# Patient Record
Sex: Male | Born: 1970 | Race: White | Hispanic: No | Smoking: Former smoker
Health system: Southern US, Community
[De-identification: ages and names within clinical notes are randomized; demographics above are authoritative.]

## PROBLEM LIST (undated history)

## (undated) ENCOUNTER — Emergency Department (HOSPITAL_COMMUNITY): Admission: EM | Payer: Managed Care, Other (non HMO)

## (undated) DIAGNOSIS — E119 Type 2 diabetes mellitus without complications: Secondary | ICD-10-CM

## (undated) DIAGNOSIS — J189 Pneumonia, unspecified organism: Secondary | ICD-10-CM

## (undated) DIAGNOSIS — G4730 Sleep apnea, unspecified: Secondary | ICD-10-CM

## (undated) DIAGNOSIS — Z5189 Encounter for other specified aftercare: Secondary | ICD-10-CM

## (undated) DIAGNOSIS — K219 Gastro-esophageal reflux disease without esophagitis: Secondary | ICD-10-CM

## (undated) DIAGNOSIS — D869 Sarcoidosis, unspecified: Secondary | ICD-10-CM

## (undated) DIAGNOSIS — E785 Hyperlipidemia, unspecified: Secondary | ICD-10-CM

## (undated) DIAGNOSIS — R7303 Prediabetes: Secondary | ICD-10-CM

## (undated) DIAGNOSIS — I10 Essential (primary) hypertension: Secondary | ICD-10-CM

## (undated) DIAGNOSIS — R0600 Dyspnea, unspecified: Secondary | ICD-10-CM

## (undated) HISTORY — DX: Encounter for other specified aftercare: Z51.89

## (undated) HISTORY — DX: Essential (primary) hypertension: I10

## (undated) HISTORY — DX: Hyperlipidemia, unspecified: E78.5

## (undated) HISTORY — DX: Gastro-esophageal reflux disease without esophagitis: K21.9

---

## 2000-06-23 HISTORY — PX: LOBECTOMY: SHX5089

## 2017-03-20 ENCOUNTER — Ambulatory Visit (INDEPENDENT_AMBULATORY_CARE_PROVIDER_SITE_OTHER): Payer: BLUE CROSS/BLUE SHIELD | Admitting: Family Medicine

## 2017-03-20 ENCOUNTER — Encounter: Payer: Self-pay | Admitting: Family Medicine

## 2017-03-20 VITALS — BP 132/82 | HR 67 | Temp 98.3°F | Ht 75.5 in | Wt 308.2 lb

## 2017-03-20 DIAGNOSIS — Z23 Encounter for immunization: Secondary | ICD-10-CM | POA: Diagnosis not present

## 2017-03-20 DIAGNOSIS — Z13220 Encounter for screening for lipoid disorders: Secondary | ICD-10-CM | POA: Diagnosis not present

## 2017-03-20 DIAGNOSIS — R0683 Snoring: Secondary | ICD-10-CM | POA: Diagnosis not present

## 2017-03-20 DIAGNOSIS — R7303 Prediabetes: Secondary | ICD-10-CM

## 2017-03-20 DIAGNOSIS — B353 Tinea pedis: Secondary | ICD-10-CM

## 2017-03-20 DIAGNOSIS — D860 Sarcoidosis of lung: Secondary | ICD-10-CM | POA: Diagnosis not present

## 2017-03-20 DIAGNOSIS — N400 Enlarged prostate without lower urinary tract symptoms: Secondary | ICD-10-CM

## 2017-03-20 DIAGNOSIS — Z7689 Persons encountering health services in other specified circumstances: Secondary | ICD-10-CM

## 2017-03-20 LAB — CBC WITH DIFFERENTIAL/PLATELET
BASOS ABS: 0.1 10*3/uL (ref 0.0–0.1)
Basophils Relative: 1.1 % (ref 0.0–3.0)
Eosinophils Absolute: 0.3 10*3/uL (ref 0.0–0.7)
Eosinophils Relative: 6.1 % — ABNORMAL HIGH (ref 0.0–5.0)
HCT: 41 % (ref 39.0–52.0)
Hemoglobin: 13.9 g/dL (ref 13.0–17.0)
Lymphocytes Relative: 24.6 % (ref 12.0–46.0)
Lymphs Abs: 1.3 10*3/uL (ref 0.7–4.0)
MCHC: 33.9 g/dL (ref 30.0–36.0)
MCV: 87 fl (ref 78.0–100.0)
MONO ABS: 0.6 10*3/uL (ref 0.1–1.0)
MONOS PCT: 10.6 % (ref 3.0–12.0)
NEUTROS ABS: 3.1 10*3/uL (ref 1.4–7.7)
Neutrophils Relative %: 57.6 % (ref 43.0–77.0)
PLATELETS: 302 10*3/uL (ref 150.0–400.0)
RBC: 4.71 Mil/uL (ref 4.22–5.81)
RDW: 13.7 % (ref 11.5–15.5)
WBC: 5.4 10*3/uL (ref 4.0–10.5)

## 2017-03-20 LAB — COMPREHENSIVE METABOLIC PANEL
ALBUMIN: 4.4 g/dL (ref 3.5–5.2)
ALT: 31 U/L (ref 0–53)
AST: 22 U/L (ref 0–37)
Alkaline Phosphatase: 67 U/L (ref 39–117)
BUN: 12 mg/dL (ref 6–23)
CO2: 28 mEq/L (ref 19–32)
CREATININE: 0.71 mg/dL (ref 0.40–1.50)
Calcium: 9.6 mg/dL (ref 8.4–10.5)
Chloride: 100 mEq/L (ref 96–112)
GFR: 125.66 mL/min (ref >60.00)
Glucose, Bld: 106 mg/dL — ABNORMAL HIGH (ref 70–99)
POTASSIUM: 4.5 meq/L (ref 3.5–5.1)
Sodium: 135 mEq/L (ref 135–145)
TOTAL PROTEIN: 7 g/dL (ref 6.0–8.3)
Total Bilirubin: 0.5 mg/dL (ref 0.2–1.2)

## 2017-03-20 LAB — HEMOGLOBIN A1C: HEMOGLOBIN A1C: 6.5 % (ref 4.6–6.5)

## 2017-03-20 LAB — LIPID PANEL
CHOL/HDL RATIO: 4
Cholesterol: 165 mg/dL (ref 0–200)
HDL: 43.3 mg/dL (ref >39.00)
LDL CALC: 86 mg/dL (ref 0–99)
NonHDL: 121.92
TRIGLYCERIDES: 182 mg/dL — AB (ref 0.0–149.0)
VLDL: 36.4 mg/dL (ref 0.0–40.0)

## 2017-03-20 MED ORDER — CLOTRIMAZOLE 1 % EX CREA: 1.0000 "application " | TOPICAL_CREAM | Freq: Two times a day (BID) | CUTANEOUS | 1 refills | Status: DC

## 2017-03-20 NOTE — Progress Notes (Signed)
Patient presents to clinic today to establish care and for acute concerns.  SUBJECTIVE: PMH: Pt is a 46 yo CF with pmh sig for pulmonary sarcoidosis, BPH, pre-DM, HLD. Patient was formerly seen in [place]. He recently relocated to the area 2/2 cold weather making it difficult for him to breathe.  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 -ower left lobectomy for a cyst seen on xray. -States had multiple issues with breathing throughout his life. -Frequent history of pneumonia. -Does not have a pulmonologist.  -Is not on medication currently. -Has a nebulizer, uses when necessary  BPH: -Patient formally on tamsulosin -d/c'd last week has patient thought it was causing weight gain. -Patient typically to 295 weight increased to 308 LBS -Patient denies urinary urgency -Endorses some incomplete emptying, wakes up 2-3x/night to urinate. -Feels ok not taking meds at this time.  PreDM -pt states last Hgb A1c in Feb 2018 was 6% -Pt endorses a FHx of DM -Has been trying to eat better. -Hard to exercise, is a truck driver.  Snoring, Acute: -Per pt told he snores and has apneic spells per wife. -Patient does endorse feeling tired throughout the day, but not sleepy. -At times it is hard for patient to sleep through the night. -HTN noted as pt is on losartan '100mg'$   Foot complaint: -Pt with b/l itching x 52month -Pt notes dried, cracked, peeling feet. -Tries to wear socks with the "vents" built in. -Denies being at the gym or walking barefoot on public surfaces.  Allergies: PCN- told not to take, not sure what happens Naproxen- Hives  PSurgHx: -[place]  Social hx: Pt recently moved to the area with his wife, Jackson Hatfield(who was seen earlier today by this provider).  [place]  Pt currently works as a tAdministrator  He denies  tobacco and drug use.  Endorses very infrequent EtOH use.  FMHx: Mom-Emphysema, tobacco use Dad- Heart dz., DM, Emphysema MGM-desc at [id number] PGM-DM PGF-never met Pt has 3 sisters.  2 of which smoke desc brother-had DM and EtOH Older brother DM   Past Medical History:  Diagnosis Date  . Hyperlipidemia   . Hypertension     History reviewed. No pertinent surgical history.  No current outpatient prescriptions on file prior to visit.   No current facility-administered medications on file prior to visit.     Allergies  Allergen Reactions  . Naproxen Hives  . Penicillins Hives    Family History  Problem Relation Age of Onset  . COPD Mother   . Alcohol abuse Father   . Arthritis Father   . Diabetes Father   . Heart disease Father   . Hyperlipidemia Father   . Kidney disease Father   . Diabetes Brother   . Stroke Brother   . Alcohol abuse Brother   . Diabetes Brother   . Drug abuse Brother     Social History   Social History  . Marital status: Married    Spouse name: N/A  . Number of children: N/A  . Years of education: N/A   Occupational History  . Not on file.   Social History Main Topics  . Smoking status: Never Smoker  . Smokeless tobacco: Never Used  . Alcohol use Yes  . Drug use: No  . Sexual activity: Not on file   Other  Topics Concern  . Not on file   Social History Narrative  . No narrative on file    ROS General: Denies fever, chills, night sweats, changes in weight, changes in appetite HEENT: Denies headaches, ear pain, changes in vision, rhinorrhea, sore throat CV: Denies CP, palpitations, SOB, orthopnea Pulm: Denies SOB, cough, wheezing  +snoring and apnea while sleeping GI: Denies abdominal pain, nausea, vomiting, diarrhea, constipation GU: Denies dysuria, hematuria  +frequency at night Msk: Denies muscle cramps, joint pains Neuro: Denies weakness, numbness, tingling Skin: Denies rashes, bruising  +itching of b/l feet Psych:  Denies depression, anxiety, hallucinations  BP 132/82 (BP Location: Right Arm, Patient Position: Sitting, Cuff Size: Large)   Pulse 67   Temp 98.3 F (36.8 C) (Oral)   Ht 6' 3.5" (1.918 m)   Wt (!) 308 lb 3.2 oz (139.8 kg)   SpO2 97%   BMI 38.01 kg/m   Physical Exam Gen. Pleasant, well developed, well-nourished, obese, in NAD HEENT - [place]/AT, PERRL, no scleral icterus, no nasal drainage, pharynx without erythema or exudate. Neck: No JVD, no thyromegaly, no carotid bruits Lungs: no accessory muscle use, CTAB, no wheezes, rales or rhonchi Cardiovascular: RRR, No r/g/m, no peripheral edema Abdomen: BS present, soft, nontender,nondistended, no hepatosplenomegaly Musculoskeletal: No deformities, moves all four extremities, no cyanosis or clubbing, normal tone Neuro:  A&Ox3, CN II-XII intact, normal gait Skin:  Warm, dry, intact.  B/l soles of feet cracked, dry, peeling.  No erythema, moist skin noted.  calloses present. Psych: normal affect, mood appropriate  No results found for this or any previous visit (from the past 2160 hour(s)).  Assessment/Plan: Pulmonary sarcoidosis (HCC) -Currently stable -Continue nebulizer use when necessary - Plan: Ambulatory referral to Pulmonology  Need for immunization against influenza  - Plan: Flu Vaccine QUAD 36+ mos IM  Screening for cholesterol level  - Plan: Lipid panel  Encounter to establish care -Records release  Benign prostatic hyperplasia without lower urinary tract symptoms -We'll not restart tamsulosin at this time -If frequency becomes an issue will start new med  Snoring  -Also with periods of apnea noted by wife -Weight loss advised -Plan: CBC with Differential/Platelet, Comprehensive metabolic panel, Nocturnal polysomnography (NPSG), CBC with Differential/Platelet  Tinea pedis of both feet -Discussed feet hygiene  -Given handout -Plan: clotrimazole (CLOTRIMAZOLE AF) 1 % cream  Pre-diabetes  -Discussed diet and  exercise -Given handout - Plan: Hemoglobin A1c

## 2017-03-20 NOTE — Patient Instructions (Addendum)
Athlete's Foot Athlete's foot (tinea pedis) is a fungal infection of the skin on the feet. It often occurs on the skin that is between or underneath the toes. It can also occur on the soles of the feet. The infection can spread from person to person (is contagious). Follow these instructions at home:  Apply or take over-the-counter and prescription medicines only as told by your doctor.  Keep all follow-up visits as told by your doctor. This is important.  Do not scratch your feet.  Keep your feet dry: ? Wear cotton or wool socks. Change your socks every day or if they become wet. ? Wear shoes that allow air to move around, such as sandals or canvas tennis shoes.  Wash and dry your feet: ? Every day or as told by your doctor. ? After exercising. ? Including the area between your toes.  Wear sandals in wet areas, such as locker rooms and shared showers.  Do not share any of these items: ? Towels. ? Nail clippers. ? Other personal items that touch your feet.  If you have diabetes, keep your blood sugar under control. Contact a doctor if:  You have a fever.  You have swelling, soreness, warmth, or redness in your foot.  You are not getting better with treatment.  Your symptoms get worse.  You have new symptoms. This information is not intended to replace advice given to you by your health care provider. Make sure you discuss any questions you have with your health care provider. Document Released: 11/26/2007 Document Revised: 11/15/2015 Document Reviewed: 12/11/2014 Elsevier Interactive Patient Education  2018 [employer]. Sarcoidosis Sarcoidosis is a disease that causes inflammation in your organs and other areas of your body. The lungs are most often affected (pulmonary sarcoidosis). Sarcoidosis can also affect your lymph nodes, liver, eyes, skin, or any other body tissue. When you have sarcoidosis, small clumps of tissue (granulomas) form in the affected area of your  body. Granulomas are made up of your body's defense (immune) cells. Inflammation results when your body reacts to a harmful substance. Normally, inflammation goes away after immune cells get rid of the harmful substance. In sarcoidosis, the immune cells form granulomas instead. What are the causes? The exact cause of sarcoidosis is not known. Something triggers the immune system to respond, such as dust, chemicals, bacteria, or a virus. What increases the risk? You may be at a greater risk for sarcoidosis if you:  Have a family history of the disease.  Are African American.  Are of Northern European ancestry.  Are 68-47 years old.  Are male.  What are the signs or symptoms? Many people with sarcoidosis have no symptoms. Others have very mild symptoms. Sarcoidosis most often affects the lungs. Symptoms include:  Chest pain.  Coughing.  Wheezing.  Shortness of breath.  Other common symptoms include:  Night sweats.  Weight loss.  Fatigue.  Depression.  A sense of uneasiness.  How is this diagnosed? Sarcoidosis may be diagnosed by:  Medical history and physical exam.  Chest X-ray. This looks for granulomas in your lungs.  Lung function tests. These measure your breathing and look for problems related to sarcoidosis.  Examining a sample of tissue under a microscope (biopsy).  How is this treated? Sarcoidosis usually clears up without treatment. You may take medicines to reduce inflammation or relieve symptoms. These may include:  Prednisone. This steroid reduces inflammation related to sarcoidosis.  Chloroquine or hydroxychloroquine. These are antimalarial medicines used to treat sarcoidosis that  affects the skin or brain.  Methotrexate, leflunomide, or azathioprine. These medicines affect the immune system and can help with sarcoidosis in the joints, eyes, skin, or lungs.  Inhalers. Inhaled medicines can help you breathe if sarcoidosis is affecting your  lungs.  Follow these instructions at home:  Do not use any tobacco products, including cigarettes, chewing tobacco, or electronic cigarettes. If you need help quitting, ask your health care provider.  Avoid secondhand smoke.  Avoid irritating dust and chemicals. Stay indoors on days when air quality is poor in your area.  Take medicines only as directed by your health care provider. Contact a health care provider if:  You have vision problems.  You have shortness of breath.  You have a dry, persistent cough.  You have an irregular heartbeat.  You have pain or ache in your joints, hands, or feet.  You have an unexplained rash. Get help right away if: You have chest pain. This information is not intended to replace advice given to you by your health care provider. Make sure you discuss any questions you have with your health care provider. Document Released: 04/09/2004 Document Revised: 11/15/2015 Document Reviewed: 10/05/2013 Elsevier Interactive Patient Education  2018 [employer]. Prediabetes Eating Plan Prediabetes-also called impaired glucose tolerance or impaired fasting glucose-is a condition that causes blood sugar (blood glucose) levels to be higher than normal. Following a healthy diet can help to keep prediabetes under control. It can also help to lower the risk of type 2 diabetes and heart disease, which are increased in people who have prediabetes. Along with regular exercise, a healthy diet:  Promotes weight loss.  Helps to control blood sugar levels.  Helps to improve the way that the body uses insulin.  What do I need to know about this eating plan?  Use the glycemic index (GI) to plan your meals. The index tells you how quickly a food will raise your blood sugar. Choose low-GI foods. These foods take a longer time to raise blood sugar.  Pay close attention to the amount of carbohydrates in the food that you eat. Carbohydrates increase blood sugar  levels.  Keep track of how many calories you take in. Eating the right amount of calories will help you to achieve a healthy weight. Losing about 7 percent of your starting weight can help to prevent type 2 diabetes.  You may want to follow a Mediterranean diet. This diet includes a lot of vegetables, lean meats or fish, whole grains, fruits, and healthy oils and fats. What foods can I eat? Grains Whole grains, such as whole-wheat or whole-grain breads, crackers, cereals, and pasta. Unsweetened oatmeal. Bulgur. Barley. Quinoa. Espaillat rice. Corn or whole-wheat flour tortillas or taco shells. Vegetables Lettuce. Spinach. Peas. Beets. Cauliflower. Cabbage. Broccoli. Carrots. Tomatoes. Squash. Eggplant. Herbs. Peppers. Onions. Cucumbers. Brussels sprouts. Fruits Berries. Bananas. Apples. Oranges. Grapes. Papaya. Mango. Pomegranate. Kiwi. Grapefruit. Cherries. Meats and Other Protein Sources Seafood. Lean meats, such as chicken and [place] or lean cuts of pork and beef. Tofu. Eggs. Nuts. Beans. Dairy Low-fat or fat-free dairy products, such as yogurt, cottage cheese, and cheese. Beverages Water. Tea. Coffee. Sugar-free or diet soda. Seltzer water. Milk. Milk alternatives, such as soy or almond milk. Condiments Mustard. Relish. Low-fat, low-sugar ketchup. Low-fat, low-sugar barbecue sauce. Low-fat or fat-free mayonnaise. Sweets and Desserts Sugar-free or low-fat pudding. Sugar-free or low-fat ice cream and other frozen treats. Fats and Oils Avocado. Walnuts. Olive oil. The items listed above may not be a complete list of  recommended foods or beverages. Contact your dietitian for more options. What foods are not recommended? Grains Refined white flour and flour products, such as bread, pasta, snack foods, and cereals. Beverages Sweetened drinks, such as sweet iced tea and soda. Sweets and Desserts Baked goods, such as cake, cupcakes, pastries, cookies, and cheesecake. The items listed above  may not be a complete list of foods and beverages to avoid. Contact your dietitian for more information. This information is not intended to replace advice given to you by your health care provider. Make sure you discuss any questions you have with your health care provider. Document Released: 10/24/2014 Document Revised: 11/15/2015 Document Reviewed: 07/05/2014 Elsevier Interactive Patient Education  2017 [employer].   We have ordered labs at this visit. It can take up to 1-2 weeks for results and processing. If results require follow up or explanation, we will call you with instructions. Clinically stable results will be released to your [facility] or sent to you via mail. If you have not heard from [place] or cannot find your results in [facility] in 2 weeks please contact our office at [phone].

## 2017-03-23 ENCOUNTER — Telehealth: Payer: Self-pay | Admitting: Family Medicine

## 2017-03-23 NOTE — Telephone Encounter (Signed)
Spoke with patient regarding labs nothing further needed

## 2017-03-23 NOTE — Telephone Encounter (Signed)
Pt is returning torry call

## 2017-03-27 ENCOUNTER — Institutional Professional Consult (permissible substitution): Payer: BLUE CROSS/BLUE SHIELD | Admitting: Pulmonary Disease

## 2017-04-01 ENCOUNTER — Telehealth: Payer: Self-pay | Admitting: Family Medicine

## 2017-04-01 NOTE — Telephone Encounter (Signed)
Please advise 

## 2017-04-01 NOTE — Telephone Encounter (Signed)
° ° °  Pt request refill of the following:   losartan (COZAAR) 100 MG tablet  lovastatin (MEVACOR) 10 MG tablet    Phamacy:  [facility] [address]

## 2017-04-02 ENCOUNTER — Other Ambulatory Visit: Payer: Self-pay | Admitting: Family Medicine

## 2017-04-02 MED ORDER — LOSARTAN POTASSIUM 100 MG PO TABS: 100.0000 mg | ORAL_TABLET | Freq: Every day | ORAL | 3 refills | Status: DC

## 2017-04-02 MED ORDER — LOVASTATIN 10 MG PO TABS: 10.0000 mg | ORAL_TABLET | Freq: Every day | ORAL | 3 refills | Status: DC

## 2017-04-02 NOTE — Progress Notes (Signed)
Refilled losartan 100 mg and lovastatin 10 mg

## 2017-04-02 NOTE — Telephone Encounter (Signed)
Both refilled

## 2017-04-08 ENCOUNTER — Institutional Professional Consult (permissible substitution): Payer: BLUE CROSS/BLUE SHIELD | Admitting: Emergency Medicine

## 2017-07-29 ENCOUNTER — Encounter: Payer: Self-pay | Admitting: Family Medicine

## 2017-07-29 ENCOUNTER — Ambulatory Visit: Payer: BLUE CROSS/BLUE SHIELD | Admitting: Family Medicine

## 2017-07-29 VITALS — BP 120/80 | HR 89 | Temp 98.8°F | Wt 310.9 lb

## 2017-07-29 DIAGNOSIS — N401 Enlarged prostate with lower urinary tract symptoms: Secondary | ICD-10-CM | POA: Diagnosis not present

## 2017-07-29 DIAGNOSIS — J4520 Mild intermittent asthma, uncomplicated: Secondary | ICD-10-CM

## 2017-07-29 DIAGNOSIS — R3914 Feeling of incomplete bladder emptying: Secondary | ICD-10-CM | POA: Diagnosis not present

## 2017-07-29 DIAGNOSIS — R0683 Snoring: Secondary | ICD-10-CM

## 2017-07-29 MED ORDER — ALBUTEROL SULFATE 0.63 MG/3ML IN NEBU: 1.0000 | INHALATION_SOLUTION | Freq: Four times a day (QID) | RESPIRATORY_TRACT | 5 refills | Status: DC | PRN

## 2017-07-29 MED ORDER — ALBUTEROL SULFATE HFA 108 (90 BASE) MCG/ACT IN AERS: 2.0000 | INHALATION_SPRAY | RESPIRATORY_TRACT | 2 refills | Status: DC | PRN

## 2017-07-29 MED ORDER — TAMSULOSIN HCL 0.4 MG PO CAPS: 0.4000 mg | ORAL_CAPSULE | Freq: Every day | ORAL | 3 refills | Status: DC

## 2017-07-29 NOTE — Progress Notes (Signed)
Subjective:    Patient ID: Jackson Hatfield, male    DOB: 1971/03/28, 47 y.o.   MRN: [id number]  Chief Complaint  Patient presents with  . Acute Visit    HPI Patient was seen today for follow-up.  Patient endorses increasing urinary symptoms.  In the past he was on Flomax but has been off of it for several months.  Patient endorses nocturia, incomplete bladder emptying, foul-smelling urine.  Patient also endorses one episode of the ED.  Patient does endorse increased stress at home and work.  Patient also endorses weight gain.  States he is trying to lose weight by doing a no carb diet with his wife.  Patient also endorses needing a referral for sleep study.  Referral was placed during last [facility]V, however patient was unable to make the appointment.  A new referral was needed.  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  He has 3 months to complete this.  Past Medical History:  Diagnosis Date  . Hyperlipidemia   . Hypertension     Allergies  Allergen Reactions  . Naproxen Hives  . Penicillins Hives    ROS General: Denies fever, chills, night sweats, changes in weight, changes in appetite HEENT: Denies headaches, ear pain, changes in vision, rhinorrhea, sore throat CV: Denies CP, palpitations, SOB, orthopnea Pulm: Denies SOB, cough, wheezing  + snoring GI: Denies abdominal pain, nausea, vomiting, diarrhea, constipation GU: Denies dysuria, hematuria, vaginal discharge  +urinary frequnecy, incomplete bladder emptying, odor, ED Msk: Denies muscle cramps, joint pains Neuro: Denies weakness, numbness, tingling Skin: Denies rashes, bruising Psych: Denies depression, anxiety, hallucinations     Objective:    Blood pressure 120/80, pulse 89, temperature 98.8 F (37.1 C), temperature source Oral, weight (!) 310 lb 14.4 oz (141 kg).   Gen. Pleasant, well-nourished, in no distress, normal affect   HEENT: [place]/AT, face symmetric, no scleral  icterus, PERRLA, nares patent without drainage, pharynx without erythema or exudate. Lungs: no accessory muscle use, CTAB, no wheezes or rales Cardiovascular: RRR, no m/r/g, no peripheral edema Abdomen: BS present, soft, NT/ND Neuro:  A&Ox3, CN II-XII intact, normal gait Skin:  Warm, no lesions/ rash   Wt Readings from Last 3 Encounters:  07/29/17 (!) 310 lb 14.4 oz (141 kg)  03/20/17 (!) 308 lb 3.2 oz (139.8 kg)    Lab Results  Component Value Date   WBC 5.4 03/20/2017   HGB 13.9 03/20/2017   HCT 41.0 03/20/2017   PLT 302.0 03/20/2017   GLUCOSE 106 (H) 03/20/2017   CHOL 165 03/20/2017   TRIG 182.0 (H) 03/20/2017   HDL 43.30 03/20/2017   LDLCALC 86 03/20/2017   ALT 31 03/20/2017   AST 22 03/20/2017   NA 135 03/20/2017   K 4.5 03/20/2017   CL 100 03/20/2017   CREATININE 0.71 03/20/2017   BUN 12 03/20/2017   CO2 28 03/20/2017   HGBA1C 6.5 03/20/2017    Assessment/Plan:  Benign prostatic hyperplasia with incomplete bladder emptying  -Attempted to obtain urine sample, however pt was unable to produce one.  Will obtain at later date. -will restart flomax. - Plan: tamsulosin (FLOMAX) 0.4 MG CAPS capsule -f/u in the next month if symptoms continue.    Mild intermittent asthma without complication  - Plan: albuterol (PROVENTIL HFA;VENTOLIN HFA) 108 (90 Base) MCG/ACT inhaler, albuterol (ACCUNEB) 0.63 MG/3ML nebulizer solution  Snoring  -STOP BANG score 6 = high risk for OSA   1 point for snoring,  observed apnea, blood pressure, BMI, neck size, male gender. - Plan: Ambulatory referral to Pulmonology   F/u in 1 month, sooner if needed.  Will also readdress ED if not improved/resolved.   Grier Mitts, MD

## 2017-07-29 NOTE — Patient Instructions (Addendum)
Benign Prostatic Hyperplasia  Benign prostatic hyperplasia (BPH) is an enlarged prostate gland that is caused by the normal aging process and not by cancer. The prostate is a walnut-sized gland that is involved in the production of semen. It is located in front of the rectum and below the bladder. The bladder stores urine and the urethra is the tube that carries the urine out of the body. The prostate may get bigger as a man gets older.  An enlarged prostate can press on the urethra. This can make it harder to pass urine. The build-up of urine in the bladder can cause infection. Back pressure and infection may progress to bladder damage and kidney (renal) failure.  What are the causes?  This condition is part of a normal aging process. However, not all men develop problems from this condition. If the prostate enlarges away from the urethra, urine flow will not be blocked. If it enlarges toward the urethra and compresses it, there will be problems passing urine.  What increases the risk?  This condition is more likely to develop in men over the age of 50 years.  What are the signs or symptoms?  Symptoms of this condition include:  · Getting up often during the night to urinate.  · Needing to urinate frequently during the day.  · Difficulty starting urine flow.  · Decrease in size and strength of your urine stream.  · Leaking (dribbling) after urinating.  · Inability to pass urine. This needs immediate treatment.  · Inability to completely empty your bladder.  · Pain when you pass urine. This is more common if there is also an infection.  · Urinary tract infection (UTI).    How is this diagnosed?  This condition is diagnosed based on your medical history, a physical exam, and your symptoms. Tests will also be done, such as:  · A post-void bladder scan. This measures any amount of urine that may remain in your bladder after you finish urinating.  · A digital rectal exam. In a rectal exam, your health care provider  checks your prostate by putting a lubricated, gloved finger into your rectum to feel the back of your prostate gland. This exam detects the size of your gland and any abnormal lumps or growths.  · An exam of your urine (urinalysis).  · A prostate specific antigen (PSA) screening. This is a blood test used to screen for prostate cancer.  · An ultrasound. This test uses sound waves to electronically produce a picture of your prostate gland.    Your health care provider may refer you to a specialist in kidney and prostate diseases (urologist).  How is this treated?  Once symptoms begin, your health care provider will monitor your condition (active surveillance or watchful waiting). Treatment for this condition will depend on the severity of your condition. Treatment may include:  · Observation and yearly exams. This may be the only treatment needed if your condition and symptoms are mild.  · Medicines to relieve your symptoms, including:  ? Medicines to shrink the prostate.  ? Medicines to relax the muscle of the prostate.  · Surgery in severe cases. Surgery may include:  ? Prostatectomy. In this procedure, the prostate tissue is removed completely through an open incision or with a laparascope or robotics.  ? Transurethral resection of the prostate (TURP). In this procedure, a tool is inserted through the opening at the tip of the penis (urethra). It is used to cut away tissue of   the inner core of the prostate. The pieces are removed through the same opening of the penis. This removes the blockage.  ? Transurethral incision (TUIP). In this procedure, small cuts are made in the prostate. This lessens the prostate's pressure on the urethra.  ? Transurethral microwave thermotherapy (TUMT). This procedure uses microwaves to create heat. The heat destroys and removes a small amount of prostate tissue.  ? Transurethral needle ablation (TUNA). This procedure uses radio frequencies to destroy and remove a small amount of  prostate tissue.  ? Interstitial laser coagulation (ILC). This procedure uses a laser to destroy and remove a small amount of prostate tissue.  ? Transurethral electrovaporization (TUVP). This procedure uses electrodes to destroy and remove a small amount of prostate tissue.  ? Prostatic urethral lift. This procedure inserts an implant to push the lobes of the prostate away from the urethra.    Follow these instructions at home:  · Take over-the-counter and prescription medicines only as told by your health care provider.  · Monitor your symptoms for any changes. Contact your health care provider with any changes.  · Avoid drinking large amounts of liquid before going to bed or out in public.  · Avoid or reduce how much caffeine or alcohol you drink.  · Give yourself time when you urinate.  · Keep all follow-up visits as told by your health care provider. This is important.  Contact a health care provider if:  · You have unexplained back pain.  · Your symptoms do not get better with treatment.  · You develop side effects from the medicine you are taking.  · Your urine becomes very dark or has a bad smell.  · Your lower abdomen becomes distended and you have trouble passing your urine.  Get help right away if:  · You have a fever or chills.  · You suddenly cannot urinate.  · You feel lightheaded, or very dizzy, or you faint.  · There are large amounts of blood or clots in the urine.  · Your urinary problems become hard to manage.  · You develop moderate to severe low back or flank pain. The flank is the side of your body between the ribs and the hip.  These symptoms may represent a serious problem that is an emergency. Do not wait to see if the symptoms will go away. Get medical help right away. Call your local emergency services (911 in the [place] Do not drive yourself to the hospital.  Summary  · Benign prostatic hyperplasia (BPH) is an enlarged prostate that is caused by the normal aging process and not by  cancer.  · An enlarged prostate can press on the urethra. This can make it hard to pass urine.  · This condition is part of a normal aging process and is more likely to develop in men over the age of 50 years.  · Get help right away if you suddenly cannot urinate.  This information is not intended to replace advice given to you by your health care provider. Make sure you discuss any questions you have with your health care provider.  Document Released: 06/09/2005 Document Revised: 07/14/2016 Document Reviewed: 07/14/2016  Elsevier Interactive Patient Education © 2018 [employer]

## 2017-07-31 ENCOUNTER — Ambulatory Visit: Payer: BLUE CROSS/BLUE SHIELD | Admitting: Family Medicine

## 2017-08-06 ENCOUNTER — Ambulatory Visit (INDEPENDENT_AMBULATORY_CARE_PROVIDER_SITE_OTHER)
Admission: RE | Admit: 2017-08-06 | Discharge: 2017-08-06 | Disposition: A | Discharge status: Home / Self Care | Payer: BLUE CROSS/BLUE SHIELD | Source: Ambulatory Visit | Attending: Pulmonary Disease | Admitting: Pulmonary Disease

## 2017-08-06 ENCOUNTER — Encounter: Payer: Self-pay | Admitting: Pulmonary Disease

## 2017-08-06 ENCOUNTER — Other Ambulatory Visit (INDEPENDENT_AMBULATORY_CARE_PROVIDER_SITE_OTHER): Payer: BLUE CROSS/BLUE SHIELD

## 2017-08-06 ENCOUNTER — Ambulatory Visit: Payer: BLUE CROSS/BLUE SHIELD | Admitting: Pulmonary Disease

## 2017-08-06 DIAGNOSIS — D869 Sarcoidosis, unspecified: Secondary | ICD-10-CM

## 2017-08-06 DIAGNOSIS — R0989 Other specified symptoms and signs involving the circulatory and respiratory systems: Secondary | ICD-10-CM | POA: Diagnosis not present

## 2017-08-06 DIAGNOSIS — G4733 Obstructive sleep apnea (adult) (pediatric): Secondary | ICD-10-CM | POA: Insufficient documentation

## 2017-08-06 LAB — COMPREHENSIVE METABOLIC PANEL
ALK PHOS: 68 U/L (ref 39–117)
ALT: 23 U/L (ref 0–53)
AST: 18 U/L (ref 0–37)
Albumin: 4.3 g/dL (ref 3.5–5.2)
BILIRUBIN TOTAL: 0.4 mg/dL (ref 0.2–1.2)
BUN: 17 mg/dL (ref 6–23)
CO2: 28 meq/L (ref 19–32)
CREATININE: 0.86 mg/dL (ref 0.40–1.50)
Calcium: 9.4 mg/dL (ref 8.4–10.5)
Chloride: 103 mEq/L (ref 96–112)
GFR: 100.57 mL/min (ref >60.00)
GLUCOSE: 140 mg/dL — AB (ref 70–99)
Potassium: 3.9 mEq/L (ref 3.5–5.1)
SODIUM: 138 meq/L (ref 135–145)
TOTAL PROTEIN: 7.2 g/dL (ref 6.0–8.3)

## 2017-08-06 LAB — CBC WITH DIFFERENTIAL/PLATELET
Basophils Absolute: 0.1 10*3/uL (ref 0.0–0.1)
Basophils Relative: 1 % (ref 0.0–3.0)
EOS ABS: 0.3 10*3/uL (ref 0.0–0.7)
Eosinophils Relative: 5.2 % — ABNORMAL HIGH (ref 0.0–5.0)
HCT: 40 % (ref 39.0–52.0)
Hemoglobin: 13.9 g/dL (ref 13.0–17.0)
LYMPHS ABS: 1.6 10*3/uL (ref 0.7–4.0)
Lymphocytes Relative: 23.4 % (ref 12.0–46.0)
MCHC: 34.7 g/dL (ref 30.0–36.0)
MCV: 83.5 fl (ref 78.0–100.0)
MONO ABS: 0.6 10*3/uL (ref 0.1–1.0)
MONOS PCT: 8.7 % (ref 3.0–12.0)
NEUTROS ABS: 4.2 10*3/uL (ref 1.4–7.7)
NEUTROS PCT: 61.7 % (ref 43.0–77.0)
PLATELETS: 317 10*3/uL (ref 150.0–400.0)
RBC: 4.79 Mil/uL (ref 4.22–5.81)
RDW: 13.5 % (ref 11.5–15.5)
WBC: 6.7 10*3/uL (ref 4.0–10.5)

## 2017-08-06 NOTE — Patient Instructions (Signed)
Schedule home sleep study.  Chest x-ray today. Blood work today.  Schedule PFTs

## 2017-08-06 NOTE — Progress Notes (Signed)
Subjective:    Patient ID: Jackson Hatfield, male    DOB: 1971/03/19, 47 y.o.   MRN: [id number]  HPI  Chief Complaint  Patient presents with  . Sleep Consult    Referred by Dr. Volanda Napoleon for possible OSA. 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 Was told to get checked for sleep apnea in order to get full card.     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 He also mentions that he was diagnosed with sarcoidosis many years ago and waould like an evaluation.  He underwent a DOT physical and was told that because of his BMI and neck size he should be evaluated for sleep apnea.  He denies excessive daytime somnolence or fatigue. Epworth sleepiness score is 1 and he denies problems watching TV or as a passenger or any problems driving.  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  His weight is fluctuated within 10-12 pounds There is no history suggestive of cataplexy, sleep paralysis or parasomnias   He underwent left lower lobectomy in 2003 when he used to live in [place] and was told that he has a cyst. When he moved to [place] he underwent further evaluation including a mediastinoscopy and was diagnosed with sarcoidosis in 2006. He denies dyspnea except in unusual exertion such as playing basketball with his kids. He denies wheezing or frequent chest colds.  He has not had pulmonary evaluation in years.  He is a lifetime never smoker, lives with his wife. Drinks 1-2 glasses of wine a week    Past Medical History:  Diagnosis Date  . Hyperlipidemia   . Hypertension     History reviewed. No pertinent surgical  history.  Allergies  Allergen Reactions  . Naproxen Hives  . Penicillins Hives     Social History   Socioeconomic History  . Marital status: Married    Spouse name: Not on file  . Number of children: Not on file  . Years of education: Not on file  . Highest education level: Not on file  Social Needs  . Financial resource strain: Not on file  . Food insecurity - worry: Not on file  . Food insecurity - inability: Not on file  . Transportation needs - medical: Not on file  . Transportation needs - non-medical: Not on file  Occupational History  . Not on file  Tobacco Use  . Smoking status: Never Smoker  . Smokeless tobacco: Never Used  Substance and Sexual Activity  . Alcohol use: Yes  . Drug use: No  . Sexual activity: Not on file  Other Topics Concern  . Not on file  Social History Narrative  . Not on file    Family History  Problem Relation Age of Onset  . COPD Mother   . Alcohol abuse Father   . Arthritis Father   . Diabetes Father   . Heart disease Father   . Hyperlipidemia Father   . Kidney disease Father   . Diabetes Brother   . Stroke Brother   . Alcohol abuse Brother   . Diabetes Brother   . Drug abuse Brother  Review of Systems  Constitutional: negative for anorexia, fevers and sweats  Eyes: negative for irritation, redness and visual disturbance  Ears, nose, mouth, throat, and face: negative for earaches, epistaxis, nasal congestion and sore throat  Respiratory: negative for cough,  sputum and wheezing  Cardiovascular: negative for chest pain, lower extremity edema, orthopnea, palpitations and syncope  Gastrointestinal: negative for abdominal pain, constipation, diarrhea, melena, nausea and vomiting  Genitourinary:negative for dysuria, frequency and hematuria  Hematologic/lymphatic: negative for bleeding, easy bruising and lymphadenopathy  Musculoskeletal:negative for arthralgias, muscle weakness and stiff joints  Neurological: negative  for coordination problems, gait problems, headaches and weakness  Endocrine: negative for diabetic symptoms including polydipsia, polyuria and weight loss     Objective:   Physical Exam  Gen. Pleasant, obese, in no distress, normal affect ENT - no lesions, no post nasal drip, class 2-3 airway Neck: No JVD, no thyromegaly, no carotid bruits Lungs: no use of accessory muscles, no dullness to percussion, decreased with left lower  rales NO rhonchi  Cardiovascular: Rhythm regular, heart sounds  normal, no murmurs or gallops, no peripheral edema Abdomen: soft and non-tender, no hepatosplenomegaly, BS normal. Musculoskeletal: No deformities, no cyanosis or clubbing Neuro:  alert, non focal, no tremors       Assessment & Plan:

## 2017-08-06 NOTE — Assessment & Plan Note (Signed)
Given excessive daytime somnolence, narrow pharyngeal exam, witnessed apneas & loud snoring, obstructive sleep apnea is very likely & an overnight polysomnogram will be scheduled as a home study. The pathophysiology of obstructive sleep apnea , it's cardiovascular consequences & modes of treatment including CPAP were discused with the patient in detail & they evidenced understanding.  Pretest probability is intermediate.  He may be self treating himself by lying on his side and not sleeping supine.  He would prefer the auto CPAP option if he ends up having this condition

## 2017-08-06 NOTE — Assessment & Plan Note (Signed)
Schedule PFTs to establish baseline lung function. Chest x-ray today. Basic blood work including ACE level and calcium

## 2017-08-07 ENCOUNTER — Telehealth: Payer: Self-pay | Admitting: Pulmonary Disease

## 2017-08-07 LAB — ANGIOTENSIN CONVERTING ENZYME: Angiotensin-Converting Enzyme: 50 U/L (ref 9–67)

## 2017-08-07 NOTE — Telephone Encounter (Signed)
Notes recorded by Rigoberto Noel, MD on 08/06/2017 at 10:26 PM EST Scarring at lung bases c/w his h/o sarcoid - can do CT chest to see extent of scarring but not absolutely necessary - Await PFTs Calcium level ok  Pt is aware of results and voiced his understanding. Nothing further is needed.

## 2017-08-19 DIAGNOSIS — G4733 Obstructive sleep apnea (adult) (pediatric): Secondary | ICD-10-CM | POA: Diagnosis not present

## 2017-08-20 ENCOUNTER — Other Ambulatory Visit: Payer: Self-pay | Admitting: *Deleted

## 2017-08-20 ENCOUNTER — Telehealth: Payer: Self-pay | Admitting: Pulmonary Disease

## 2017-08-20 DIAGNOSIS — G4733 Obstructive sleep apnea (adult) (pediatric): Secondary | ICD-10-CM | POA: Diagnosis not present

## 2017-08-20 NOTE — Telephone Encounter (Signed)
Called and spoke with pt and he stated that he did the home sleep test last night and he stated that he was up about 4 times last night due to his dogs were barking and going crazy.  Pt stated that he normally is not up that much and normally sleeps better.  Wanted to let RA know about this in case this affects his test.  FYI for RA.

## 2017-08-21 ENCOUNTER — Telehealth: Payer: Self-pay

## 2017-08-21 DIAGNOSIS — G4733 Obstructive sleep apnea (adult) (pediatric): Secondary | ICD-10-CM

## 2017-08-21 NOTE — Telephone Encounter (Signed)
Per RA. Sleep study showed moderate to severe OSA with 31 events/h. Recommend auto cpap 5-15cm with mask of choice and humidity. OV in 4 weeks with TP or RA.  Pt is aware of results and voiced his understanding. Order has been placed for cpap therapy. Pt scheduled with TP on 09/04/17 at 9:00. Nothing further is needed.

## 2017-09-02 DIAGNOSIS — G4733 Obstructive sleep apnea (adult) (pediatric): Secondary | ICD-10-CM | POA: Diagnosis not present

## 2017-10-03 DIAGNOSIS — G4733 Obstructive sleep apnea (adult) (pediatric): Secondary | ICD-10-CM | POA: Diagnosis not present

## 2017-10-05 ENCOUNTER — Ambulatory Visit (INDEPENDENT_AMBULATORY_CARE_PROVIDER_SITE_OTHER): Payer: BLUE CROSS/BLUE SHIELD | Admitting: Adult Health

## 2017-10-05 ENCOUNTER — Encounter: Payer: Self-pay | Admitting: *Deleted

## 2017-10-05 ENCOUNTER — Encounter: Payer: Self-pay | Admitting: Adult Health

## 2017-10-05 DIAGNOSIS — D869 Sarcoidosis, unspecified: Secondary | ICD-10-CM

## 2017-10-05 DIAGNOSIS — G4733 Obstructive sleep apnea (adult) (pediatric): Secondary | ICD-10-CM | POA: Diagnosis not present

## 2017-10-05 NOTE — Assessment & Plan Note (Signed)
No significant sx  ACE level nml .  cXR with BB interstiital markings,  Check PFT on return is abn , consider CT chest  Yearly eye exams.

## 2017-10-05 NOTE — Patient Instructions (Addendum)
Keep up the great work Continue on CPAP at bedtime Continue to work on a healthy weight Do not drive if sleepy Follow-up in 3 months with PFT with Dr. Elsworth Soho

## 2017-10-05 NOTE — Progress Notes (Signed)
@Patient  ID: Jackson Hatfield, male    DOB: 1971-04-10, 47 y.o.   MRN: [id number]  Chief Complaint  Patient presents with  . Follow-up    Referring provider: Billie Ruddy, MD  HPI: 47 year old male never smoker seen for sleep consult August 06, 2017 found to have Severe sleep apnea Carries dx of Sarcoid , dx 2006 from mediastinoscopy .  Previous Left Lower lobectomy in 2003 from cyst.   TEST  Sleep study showed moderate to severe OSA with 31 events/h.  10/05/2017 Follow up : OSA and Sarcoid  Patient returns for a 18-month follow-up.  Patient was seen last visit for a sleep consult and to establish for sarcoidosis.  Patient was set up for a sleep study that showed severe sleep apnea with AHI at 31/hour with.  Patient was set up for a CPAP to wear at bedtime.  Patient says he is doing well on CPAP.  Trying to get used to the machine . Tried new mask. He feels decreased daytime sleepiness.  Download shows excellent compliance 97% compliance with average usage at 5.5 hours.  Patient is on auto CPAP 5-15 cm H2O.  AHI 0.7.  Positive leaks.  Patient carries a previous sarcoidosis diagnosis from mediastinoscopy in 2006. Chest x-ray last visit showed some increased interstitial markings along the bases.  ACE level was normal at 50.  Calcium and LFTs were normal.  PFTs are pending. No significant dyspnea or cough . No rash .  Gets yearly eye exams.        Allergies  Allergen Reactions  . Naproxen Hives  . Penicillins Hives    Immunization History  Administered Date(s) Administered  . Influenza,inj,Quad PF,6+ Mos 03/20/2017    Past Medical History:  Diagnosis Date  . Hyperlipidemia   . Hypertension     Tobacco History: Social History   Tobacco Use  Smoking Status Never Smoker  Smokeless Tobacco Never Used   Counseling given: Not Answered   Outpatient Encounter Medications as of 10/05/2017  Medication Sig  . albuterol (ACCUNEB) 0.63 MG/3ML nebulizer solution Take 3  mLs (0.63 mg total) by nebulization every 6 (six) hours as needed for wheezing.  Marland Kitchen albuterol (PROVENTIL HFA;VENTOLIN HFA) 108 (90 Base) MCG/ACT inhaler Inhale 2 puffs into the lungs every 4 (four) hours as needed for wheezing or shortness of breath.  . losartan (COZAAR) 100 MG tablet Take 1 tablet (100 mg total) by mouth daily.  Marland Kitchen lovastatin (MEVACOR) 10 MG tablet Take 1 tablet (10 mg total) by mouth at bedtime.  . Multiple Vitamins-Minerals (MENS ONE DAILY PO) Take by mouth.  . tamsulosin (FLOMAX) 0.4 MG CAPS capsule Take 1 capsule (0.4 mg total) by mouth daily.  . [DISCONTINUED] clotrimazole (CLOTRIMAZOLE AF) 1 % cream Apply 1 application topically 2 (two) times daily. (Patient not taking: Reported on 10/05/2017)   No facility-administered encounter medications on file as of 10/05/2017.      Review of Systems  Constitutional:   No  weight loss, night sweats,  Fevers, chills, fatigue, or  lassitude.  HEENT:   No headaches,  Difficulty swallowing,  Tooth/dental problems, or  Sore throat,                No sneezing, itching, ear ache, nasal congestion, post nasal drip,   CV:  No chest pain,  Orthopnea, PND, swelling in lower extremities, anasarca, dizziness, palpitations, syncope.   GI  No heartburn, indigestion, abdominal pain, nausea, vomiting, diarrhea, change in bowel habits, loss of appetite, bloody stools.  Resp: No shortness of breath with exertion or at rest.  No excess mucus, no productive cough,  No non-productive cough,  No coughing up of blood.  No change in color of mucus.  No wheezing.  No chest wall deformity  Skin: no rash or lesions.  GU: no dysuria, change in color of urine, no urgency or frequency.  No flank pain, no hematuria   MS:  No joint pain or swelling.  No decreased range of motion.  No back pain.    Physical Exam  BP (!) 144/80 (BP Location: Left Arm, Cuff Size: Large)   Pulse 78   Ht 6\' 2"  (1.88 m)   Wt (!) 305 lb 3.2 oz (138.4 kg)   SpO2 96%   BMI  39.19 kg/m   GEN: A/Ox3; pleasant , NAD, obese    HEENT:  [place]/AT,  EACs-clear, TMs-wnl, NOSE-clear, THROAT-clear, no lesions, no postnasal drip or exudate noted.  Class 2-3 MP AIRWAY    NECK:  Supple w/ fair ROM; no JVD; normal carotid impulses w/o bruits; no thyromegaly or nodules palpated; no lymphadenopathy.    RESP  Clear  P & A; w/o, wheezes/ rales/ or rhonchi. no accessory muscle use, no dullness to percussion  CARD:  RRR, no m/r/g, no peripheral edema, pulses intact, no cyanosis or clubbing.  GI:   Soft & nt; nml bowel sounds; no organomegaly or masses detected.   Musco: Warm bil, no deformities or joint swelling noted.   Neuro: alert, no focal deficits noted.    Skin: Warm, no lesions or rashes    Lab Results:  CBC    Component Value Date/Time   WBC 6.7 08/06/2017 1029   RBC 4.79 08/06/2017 1029   HGB 13.9 08/06/2017 1029   HCT 40.0 08/06/2017 1029   PLT 317.0 08/06/2017 1029   MCV 83.5 08/06/2017 1029   MCHC 34.7 08/06/2017 1029   RDW 13.5 08/06/2017 1029   LYMPHSABS 1.6 08/06/2017 1029   MONOABS 0.6 08/06/2017 1029   EOSABS 0.3 08/06/2017 1029   BASOSABS 0.1 08/06/2017 1029    BMET    Component Value Date/Time   NA 138 08/06/2017 1029   K 3.9 08/06/2017 1029   CL 103 08/06/2017 1029   CO2 28 08/06/2017 1029   GLUCOSE 140 (H) 08/06/2017 1029   BUN 17 08/06/2017 1029   CREATININE 0.86 08/06/2017 1029   CALCIUM 9.4 08/06/2017 1029    BNP No results found for: BNP  ProBNP No results found for: PROBNP  Imaging: No results found.   Assessment & Plan:   OSA (obstructive sleep apnea) Well-controlled and excellent compliance on CPAP  Plan  Patient Instructions  Keep up the great work Continue on CPAP at bedtime Continue to work on a healthy weight Do not drive if sleepy Follow-up in 3 months with PFT with Dr. Elsworth Soho      Sarcoidosis No significant sx  ACE level nml .  cXR with BB interstiital markings,  Check PFT on return is abn ,  consider CT chest  Yearly eye exams.   Morbid obesity ([place]) Wt loss      Rexene Edison, NP 10/05/2017

## 2017-10-05 NOTE — Assessment & Plan Note (Signed)
Well-controlled and excellent compliance on CPAP  Plan  Patient Instructions  Keep up the great work Continue on CPAP at bedtime Continue to work on a healthy weight Do not drive if sleepy Follow-up in 3 months with PFT with Dr. Elsworth Soho

## 2017-10-05 NOTE — Assessment & Plan Note (Signed)
Wt loss  

## 2017-10-14 NOTE — Progress Notes (Signed)
Reviewed & agree with plan  

## 2017-11-02 DIAGNOSIS — G4733 Obstructive sleep apnea (adult) (pediatric): Secondary | ICD-10-CM | POA: Diagnosis not present

## 2017-12-04 DIAGNOSIS — S86911A Strain of unspecified muscle(s) and tendon(s) at lower leg level, right leg, initial encounter: Secondary | ICD-10-CM | POA: Diagnosis not present

## 2017-12-04 DIAGNOSIS — M25561 Pain in right knee: Secondary | ICD-10-CM | POA: Diagnosis not present

## 2018-01-22 ENCOUNTER — Ambulatory Visit: Payer: BLUE CROSS/BLUE SHIELD | Admitting: Adult Health

## 2018-01-26 ENCOUNTER — Ambulatory Visit: Payer: BLUE CROSS/BLUE SHIELD | Admitting: Adult Health

## 2018-02-04 ENCOUNTER — Telehealth: Payer: Self-pay | Admitting: Family Medicine

## 2018-02-04 NOTE — Telephone Encounter (Signed)
Copied from [place] [phone]. Topic: General - Other >> Feb 03, 2018  6:24 PM Ivar Drape wrote: Reason for CRM:   Patient wants to transfer care from Dr. Lorelee New in [place] to any provider at [facility] because it's closer to his home.  Please advise.

## 2018-02-04 NOTE — Telephone Encounter (Signed)
Please call Pt and schedule w/ provider that is currently accepting new patients. Thank you.

## 2018-02-04 NOTE — Telephone Encounter (Signed)
ok 

## 2018-02-04 NOTE — Telephone Encounter (Signed)
Called pt and scheduled appt on 02/11/18

## 2018-02-08 DIAGNOSIS — G4733 Obstructive sleep apnea (adult) (pediatric): Secondary | ICD-10-CM | POA: Diagnosis not present

## 2018-02-11 ENCOUNTER — Encounter: Payer: Self-pay | Admitting: Medical

## 2018-02-12 ENCOUNTER — Ambulatory Visit: Payer: BLUE CROSS/BLUE SHIELD | Admitting: Family Medicine

## 2018-02-25 ENCOUNTER — Ambulatory Visit: Payer: 59 | Admitting: Medical

## 2018-02-25 ENCOUNTER — Encounter: Payer: Self-pay | Admitting: Medical

## 2018-02-25 VITALS — BP 124/85 | HR 77 | Temp 98.4°F | Resp 16 | Ht 74.0 in | Wt 307.0 lb

## 2018-02-25 DIAGNOSIS — R3914 Feeling of incomplete bladder emptying: Secondary | ICD-10-CM

## 2018-02-25 DIAGNOSIS — E785 Hyperlipidemia, unspecified: Secondary | ICD-10-CM | POA: Diagnosis not present

## 2018-02-25 DIAGNOSIS — Z8719 Personal history of other diseases of the digestive system: Secondary | ICD-10-CM

## 2018-02-25 DIAGNOSIS — G4730 Sleep apnea, unspecified: Secondary | ICD-10-CM

## 2018-02-25 DIAGNOSIS — I10 Essential (primary) hypertension: Secondary | ICD-10-CM

## 2018-02-25 DIAGNOSIS — J4520 Mild intermittent asthma, uncomplicated: Secondary | ICD-10-CM

## 2018-02-25 DIAGNOSIS — Z87898 Personal history of other specified conditions: Secondary | ICD-10-CM | POA: Diagnosis not present

## 2018-02-25 DIAGNOSIS — N401 Enlarged prostate with lower urinary tract symptoms: Secondary | ICD-10-CM

## 2018-02-25 LAB — COMPREHENSIVE METABOLIC PANEL
ALT: 26 U/L (ref 0–53)
AST: 16 U/L (ref 0–37)
Albumin: 4.6 g/dL (ref 3.5–5.2)
Alkaline Phosphatase: 83 U/L (ref 39–117)
BILIRUBIN TOTAL: 0.4 mg/dL (ref 0.2–1.2)
BUN: 15 mg/dL (ref 6–23)
CALCIUM: 10.1 mg/dL (ref 8.4–10.5)
CO2: 30 meq/L (ref 19–32)
Chloride: 101 mEq/L (ref 96–112)
Creatinine, Ser: 0.74 mg/dL (ref 0.40–1.50)
GFR: 119.34 mL/min (ref >60.00)
GLUCOSE: 113 mg/dL — AB (ref 70–99)
POTASSIUM: 4.9 meq/L (ref 3.5–5.1)
Sodium: 137 mEq/L (ref 135–145)
Total Protein: 7.3 g/dL (ref 6.0–8.3)

## 2018-02-25 LAB — CBC WITH DIFFERENTIAL/PLATELET
BASOS PCT: 0.8 % (ref 0.0–3.0)
Basophils Absolute: 0.1 10*3/uL (ref 0.0–0.1)
EOS PCT: 4.4 % (ref 0.0–5.0)
Eosinophils Absolute: 0.3 10*3/uL (ref 0.0–0.7)
HEMATOCRIT: 41.2 % (ref 39.0–52.0)
Hemoglobin: 14.2 g/dL (ref 13.0–17.0)
LYMPHS ABS: 1.5 10*3/uL (ref 0.7–4.0)
LYMPHS PCT: 20.5 % (ref 12.0–46.0)
MCHC: 34.3 g/dL (ref 30.0–36.0)
MCV: 84.3 fl (ref 78.0–100.0)
MONOS PCT: 9 % (ref 3.0–12.0)
Monocytes Absolute: 0.6 10*3/uL (ref 0.1–1.0)
NEUTROS ABS: 4.7 10*3/uL (ref 1.4–7.7)
NEUTROS PCT: 65.3 % (ref 43.0–77.0)
PLATELETS: 321 10*3/uL (ref 150.0–400.0)
RBC: 4.89 Mil/uL (ref 4.22–5.81)
RDW: 14 % (ref 11.5–15.5)
WBC: 7.2 10*3/uL (ref 4.0–10.5)

## 2018-02-25 LAB — LIPID PANEL
CHOL/HDL RATIO: 3
Cholesterol: 186 mg/dL (ref 0–200)
HDL: 54.2 mg/dL (ref >39.00)
LDL Cholesterol: 98 mg/dL (ref 0–99)
NONHDL: 131.46
TRIGLYCERIDES: 167 mg/dL — AB (ref 0.0–149.0)
VLDL: 33.4 mg/dL (ref 0.0–40.0)

## 2018-02-25 MED ORDER — ALBUTEROL SULFATE 0.63 MG/3ML IN NEBU: 1.0000 | INHALATION_SOLUTION | Freq: Four times a day (QID) | RESPIRATORY_TRACT | 5 refills | Status: DC | PRN

## 2018-02-25 MED ORDER — ALBUTEROL SULFATE HFA 108 (90 BASE) MCG/ACT IN AERS: 2.0000 | INHALATION_SPRAY | RESPIRATORY_TRACT | 2 refills | Status: DC | PRN

## 2018-02-25 MED ORDER — LOSARTAN POTASSIUM 100 MG PO TABS: 100.0000 mg | ORAL_TABLET | Freq: Every day | ORAL | 3 refills | Status: DC

## 2018-02-25 MED ORDER — TAMSULOSIN HCL 0.4 MG PO CAPS: 0.4000 mg | ORAL_CAPSULE | Freq: Every day | ORAL | 3 refills | Status: DC

## 2018-02-25 NOTE — Progress Notes (Signed)
Subjective:    Patient ID: Jackson Hatfield, male    DOB: 1971-02-22, 47 y.o.   MRN: [id number]  HPI  Pt her for first time within our system. Moved to [place] and this is better location.  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 3-4 cups of coffee a day. Eating better recently. Married- 2 children.  Pt has htn. Bp controlled on losartan. When he checks bp with office visits bp 120/80.  Pt has high cholesterol and he states diet and his been good. Tries to exeriicse but he is truck Geophysicist/field seismologist.  Pt has rt knee injury with meniscus and ligament injury 2 months ago. Seeing orthopedist.  Pt has bph history and flomax does help some.  He has sleep apnea as well. Has cpap. 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  Pt states 1 month ago he had some rt jaw area pain over mandible. He was given antibiotic then jaw pain went away. Then last week pain came back but was more in submandibular pain. Pt states provider who wrote antibiotic told him the ear looked ok but thought was ear infection. Pt denies any tooth pain. Pt states last time saw dentist 6-8 month ago. No issue on rt side. He is blowing his nose more often. Both event chills and sweats. Had mild ha in tmj area.  Now no symptoms again. He did not some sore throat when gland was swollen.    Review of Systems  Constitutional: Negative for chills, fatigue and fever.  HENT: Negative for congestion, ear discharge, nosebleeds, postnasal drip, sinus pressure and sinus pain.   Respiratory: Negative for cough, chest tightness, shortness of breath and wheezing.   Cardiovascular: Negative for chest pain and palpitations.  Gastrointestinal: Negative for abdominal pain, nausea and vomiting.  Musculoskeletal: Negative for back pain, neck pain and neck stiffness.  Skin: Negative for rash.  Neurological: Negative for dizziness and light-headedness.       Faint discomfort in front of rt tmj area at times but not presently.. Not  present today.  Hematological: Positive for adenopathy. Does not bruise/bleed easily.       Hx of see hpi.  Psychiatric/Behavioral: Negative for behavioral problems and confusion.   Past Medical History:  Diagnosis Date  . Hyperlipidemia   . Hypertension      Social History   Socioeconomic History  . Marital status: Married    Spouse name: Not on file  . Number of children: Not on file  . Years of education: Not on file  . Highest education level: Not on file  Occupational History  . Not on file  Social Needs  . Financial resource strain: Not on file  . Food insecurity:    Worry: Not on file    Inability: Not on file  . Transportation needs:    Medical: Not on file    Non-medical: Not on file  Tobacco Use  . Smoking status: Never Smoker  . Smokeless tobacco: Never Used  Substance and Sexual Activity  . Alcohol use: Yes  . Drug use: No  . Sexual activity: Not on file  Lifestyle  . Physical activity:    Days per week: Not on file    Minutes per session: Not on file  . Stress: Not on file  Relationships  . Social connections:    Talks on phone: Not on file    Gets together: Not on file    Attends religious service: Not on file  Active member of club or organization: Not on file    Attends meetings of clubs or organizations: Not on file    Relationship status: Not on file  . Intimate partner violence:    Fear of current or ex partner: Not on file    Emotionally abused: Not on file    Physically abused: Not on file    Forced sexual activity: Not on file  Other Topics Concern  . Not on file  Social History Narrative  . Not on file    No past surgical history on file.  Family History  Problem Relation Age of Onset  . COPD Mother   . Alcohol abuse Father   . Arthritis Father   . Diabetes Father   . Heart disease Father   . Hyperlipidemia Father   . Kidney disease Father   . Diabetes Brother   . Stroke Brother   . Alcohol abuse Brother   . Diabetes  Brother   . Drug abuse Brother     Allergies  Allergen Reactions  . Naproxen Hives  . Penicillins Hives    Current Outpatient Medications on File Prior to Visit  Medication Sig Dispense Refill  . lovastatin (MEVACOR) 10 MG tablet Take 1 tablet (10 mg total) by mouth at bedtime. 90 tablet 3  . Multiple Vitamins-Minerals (MENS ONE DAILY PO) Take by mouth.     No current facility-administered medications on file prior to visit.     BP 124/85   Pulse 77   Temp 98.4 F (36.9 C) (Oral)   Resp 16   Ht 6\' 2"  (1.88 m)   Wt (!) 307 lb (139.3 kg)   SpO2 97%   BMI 39.42 kg/m       Objective:   Physical Exam  General  Mental Status - Alert. General Appearance - Well groomed. Not in acute distress.  Skin Rashes- No Rashes.  HEENT Head- Normal. Ear Auditory Canal - Left- Normal. Right - Normal.Tympanic Membrane- Left- Normal. Right- Normal. Eye Sclera/Conjunctiva- Left- Normal. Right- Normal. Nose & Sinuses Nasal Mucosa- Left- not Boggy and Congested. Right- not   Boggy and  Congested.Bilateral no maxillary and  No frontal sinus pressure. Mouth & Throat Lips: Upper Lip- Normal: no dryness, cracking, pallor, cyanosis, or vesicular eruption. Lower Lip-Normal: no dryness, cracking, pallor, cyanosis or vesicular eruption. Buccal Mucosa- Bilateral- No Aphthous ulcers. Oropharynx- No Discharge or Erythema.(on inspection no obvious caries lower teeth) Tonsils: Characteristics- Bilateral- No Erythema or Congestion. Size/Enlargement- Bilateral- No enlargement. Discharge- bilateral-None. On palpation an inspection of rt side face/mandible area.no abnormality.   Neck Neck- Supple. No Masses.   Chest and Lung Exam Auscultation: Breath Sounds:-Clear even and unlabored.  Cardiovascular Auscultation:Rythm- Regular, rate and rhythm. Murmurs & Other Heart Sounds:Ausculatation of the heart reveal- No Murmurs.  Lymphatic Head & Neck General Head & Neck Lymphatics: Bilateral:  Description- No Localized lymphadenopathy.No abnormality on palpation of rt submandibular area in particular.       Assessment & Plan:  Nice to meet you today.  For your BPH history and symptoms, I did refill your Flomax.  For history of mild intermittent asthma, refilled albuterol to make available in the event of future wheezing episodes.  Your blood pressure is well controlled today and recommend continuing same regimen.  For sleep apnea, continue using CPAP.  You described to events over the past month of possible ear infection versus possible parotitis.  You did describe a lymph node that was swollen and tender but source of  infection was not exactly determined during the first and second event.  No symptoms  presently.  Exam was also negative.  We will go ahead and get a CBC today to evaluate infection fighting cells.  But we do need to watch you closely and if you get any recurrent symptoms as before or swollen lymph node then would like to see you somewhat same day or soon as possible type appointment.  Also I want you to note that if you feel little bit chills or sweats at night then I will check your temperature.  We would need to know if you have documented random fevers.  If that were to occur then would need to expand work-up.  We will get metabolic panel and CMP today.  Follow-up date to be determined.  I think in light of you being a new patient 2 to 67-month follow-up would be reasonable.  But sooner if you have any recurrent episodes as we discussed today.  Mackie Pai, PA-C

## 2018-02-25 NOTE — Patient Instructions (Signed)
Nice to meet you today.  For your BPH history and symptoms, I did refill your Flomax.  For history of mild intermittent asthma, refilled albuterol to make available in the event of future wheezing episodes.  Your blood pressure is well controlled today and recommend continuing same regimen.  For sleep apnea, continue using CPAP.  You described to events over the past month of possible ear infection versus possible parotitis.  You did describe a lymph node that was swollen and tender but source of infection was not exactly determined during the first and second event.  No symptoms  presently.  Exam was also negative.  We will go ahead and get a CBC today to evaluate infection fighting cells.  But we do need to watch you closely and if you get any recurrent symptoms as before or swollen lymph node then would like to see you somewhat same day or soon as possible type appointment.  Also I want you to note that if you feel little bit chills or sweats at night then I will check your temperature.  We would need to know if you have documented random fevers.  If that were to occur then would need to expand work-up.  We will get metabolic panel and CMP today.  Follow-up date to be determined.  I think in light of you being a new patient 2 to 59-month follow-up would be reasonable.  But sooner if you have any recurrent episodes as we discussed today.

## 2018-03-04 ENCOUNTER — Ambulatory Visit: Payer: 59 | Admitting: Medical

## 2018-03-04 ENCOUNTER — Encounter: Payer: Self-pay | Admitting: Medical

## 2018-03-04 VITALS — BP 136/81 | HR 64 | Temp 98.1°F | Resp 16 | Ht 74.0 in | Wt 308.8 lb

## 2018-03-04 DIAGNOSIS — R739 Hyperglycemia, unspecified: Secondary | ICD-10-CM

## 2018-03-04 DIAGNOSIS — R5383 Other fatigue: Secondary | ICD-10-CM | POA: Diagnosis not present

## 2018-03-04 DIAGNOSIS — N529 Male erectile dysfunction, unspecified: Secondary | ICD-10-CM

## 2018-03-04 MED ORDER — METFORMIN HCL ER 500 MG PO TB24: 500.0000 mg | ORAL_TABLET | Freq: Every day | ORAL | 3 refills | Status: DC

## 2018-03-04 MED ORDER — SILDENAFIL CITRATE 50 MG PO TABS: 50.0000 mg | ORAL_TABLET | Freq: Every day | ORAL | 0 refills | Status: DC | PRN

## 2018-03-04 NOTE — Progress Notes (Signed)
Subjective:    Patient ID: Jackson Hatfield, male    DOB: [birth year], 47 y.o.   MRN: [id number]  HPI  Pt for follow up.   Pt is obese and he knows he expresses desire  to lose weight. He is willing to try weight watchers after discussion. 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 Pt can't exercise much due to his work.  Pt does report some daily mild fatigue. No recent thyroid test done.  Pt knows he is prediabetic range and limits sugars. No recent a1c.  Pt triglycerides mild high but other lipid markers good. On mevacor.  Pt mentioned remote history when younger having to use inhalers when he got sick. Sometimes when he walks he wheezes a little. He has inhaler and he does use inhaler sometimes before exercise.     Review of Systems  Constitutional: Positive for fatigue. Negative for chills and fever.  Respiratory: Positive for wheezing. Negative for cough and chest tightness.        Sometimes with walking.  Cardiovascular: Negative for chest pain and palpitations.  Gastrointestinal: Negative for abdominal distention, abdominal pain, anal bleeding, nausea and vomiting.  Endocrine: Negative for polydipsia, polyphagia and polyuria.  Genitourinary: Negative for decreased urine volume, penile pain and scrotal swelling.       ED  Musculoskeletal: Negative for back pain.  Skin: Negative for rash.  Neurological: Negative for dizziness and light-headedness.  Hematological: Negative for adenopathy. Does not bruise/bleed easily.  Psychiatric/Behavioral: Negative for decreased concentration and hallucinations. The patient is not nervous/anxious.     Past Medical History:  Diagnosis Date  . Hyperlipidemia   . Hypertension      Social History   Socioeconomic History  . Marital status: Married    Spouse name: Not on file  . Number of children: Not on file  . Years of education: Not on file  . Highest education level: Not on file  Occupational History  .  Not on file  Social Needs  . Financial resource strain: Not on file  . Food insecurity:    Worry: Not on file    Inability: Not on file  . Transportation needs:    Medical: Not on file    Non-medical: Not on file  Tobacco Use  . Smoking status: Never Smoker  . Smokeless tobacco: Never Used  Substance and Sexual Activity  . Alcohol use: Yes  . Drug use: No  . Sexual activity: Not on file  Lifestyle  . Physical activity:    Days per week: Not on file    Minutes per session: Not on file  . Stress: Not on file  Relationships  . Social connections:    Talks on phone: Not on file    Gets together: Not on file    Attends religious service: Not on file    Active member of club or organization: Not on file    Attends meetings of clubs or organizations: Not on file    Relationship status: Not on file  . Intimate partner violence:    Fear of current or ex partner: Not on file    Emotionally abused: Not on file    Physically abused: Not on file    Forced sexual activity: Not on file  Other Topics Concern  . Not on file  Social History Narrative  . Not on file    No past surgical history on file.  Family History  Problem Relation Age  of Onset  . COPD Mother   . Alcohol abuse Father   . Arthritis Father   . Diabetes Father   . Heart disease Father   . Hyperlipidemia Father   . Kidney disease Father   . Diabetes Brother   . Stroke Brother   . Alcohol abuse Brother   . Diabetes Brother   . Drug abuse Brother     Allergies  Allergen Reactions  . Naproxen Hives  . Penicillins Hives    Current Outpatient Medications on File Prior to Visit  Medication Sig Dispense Refill  . albuterol (ACCUNEB) 0.63 MG/3ML nebulizer solution Take 3 mLs (0.63 mg total) by nebulization every 6 (six) hours as needed for wheezing. 75 mL 5  . albuterol (PROVENTIL HFA;VENTOLIN HFA) 108 (90 Base) MCG/ACT inhaler Inhale 2 puffs into the lungs every 4 (four) hours as needed for wheezing or  shortness of breath. 1 Inhaler 2  . losartan (COZAAR) 100 MG tablet Take 1 tablet (100 mg total) by mouth daily. 90 tablet 3  . lovastatin (MEVACOR) 10 MG tablet Take 1 tablet (10 mg total) by mouth at bedtime. 90 tablet 3  . Multiple Vitamins-Minerals (MENS ONE DAILY PO) Take by mouth.    . tamsulosin (FLOMAX) 0.4 MG CAPS capsule Take 1 capsule (0.4 mg total) by mouth daily. 90 capsule 3   No current facility-administered medications on file prior to visit.     BP 136/81   Pulse 64   Temp 98.1 F (36.7 C) (Oral)   Resp 16   Ht 6\' 2"  (1.88 m)   Wt (!) 308 lb 12.8 oz (140.1 kg)   SpO2 97%   BMI 39.65 kg/m       Objective:   Physical Exam  General- No acute distress. Pleasant patient. Lungs- Clear, even and unlabored. Heart- regular rate and rhythm. Neurologic- CNII- XII grossly intact.       Assessment & Plan:  For your history of prediabetes/A1c level just at the diabetic cutoff line, I do think that you would benefit from the low-dose metformin.  This might also help you lose some weight.  For fatigue, I placed future lab orders to include B12, B1, vitamin D and TSH.  With the TSH which could evaluate your metabolism as well.  In order to lose weight, I think you might benefit from weight watchers app.  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  Also try to get regular exercise.  For erectile  dysfunction, I am prescribing Viagra/generic 50 mg tablets.  If this is not recently covered with the insurance then could try sending to other pharmacy with better cash price.  For high cholesterol, continue Mevacor.  Follow-up date to be determined after labs review.  Please schedule lab to be done sometime next week.  Mackie Pai, PA-C

## 2018-03-04 NOTE — Patient Instructions (Addendum)
For your history of prediabetes/A1c level just at the diabetic cutoff line, I do think that you would benefit from the low-dose metformin.  This might also help you lose some weight.  For fatigue, I placed future lab orders to include B12, B1, vitamin D and TSH.  With the TSH which could evaluate your metabolism as well.  In order to lose weight, I think you might benefit from weight watchers app.  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  Also try to get regular exercise.  For erectile  dysfunction, I am prescribing Viagra/generic 50 mg tablets.  If this is not recently covered with the insurance then could try sending to other pharmacy with better cash price.  For high cholesterol, continue Mevacor.  Follow-up date to be determined after labs review.  Please schedule lab to be done sometime next week.

## 2018-03-11 DIAGNOSIS — G4733 Obstructive sleep apnea (adult) (pediatric): Secondary | ICD-10-CM | POA: Diagnosis not present

## 2018-03-15 ENCOUNTER — Encounter: Payer: Self-pay | Admitting: Medical

## 2018-03-15 ENCOUNTER — Telehealth: Payer: Self-pay | Admitting: Medical

## 2018-03-15 DIAGNOSIS — M25561 Pain in right knee: Secondary | ICD-10-CM | POA: Diagnosis not present

## 2018-03-15 DIAGNOSIS — M1711 Unilateral primary osteoarthritis, right knee: Secondary | ICD-10-CM | POA: Diagnosis not present

## 2018-03-15 MED ORDER — SILDENAFIL CITRATE 20 MG PO TABS: ORAL_TABLET | ORAL | 0 refills | Status: DC

## 2018-03-15 NOTE — Telephone Encounter (Signed)
RX generic viagra sent to pt pharmacy.

## 2018-04-10 DIAGNOSIS — M25561 Pain in right knee: Secondary | ICD-10-CM | POA: Diagnosis not present

## 2018-04-10 DIAGNOSIS — G4733 Obstructive sleep apnea (adult) (pediatric): Secondary | ICD-10-CM | POA: Diagnosis not present

## 2018-05-11 DIAGNOSIS — G4733 Obstructive sleep apnea (adult) (pediatric): Secondary | ICD-10-CM | POA: Diagnosis not present

## 2018-05-11 DIAGNOSIS — M25561 Pain in right knee: Secondary | ICD-10-CM | POA: Diagnosis not present

## 2018-05-11 DIAGNOSIS — S83242D Other tear of medial meniscus, current injury, left knee, subsequent encounter: Secondary | ICD-10-CM | POA: Diagnosis not present

## 2018-05-18 ENCOUNTER — Ambulatory Visit: Payer: Self-pay | Admitting: Orthopedic Surgery

## 2018-05-31 ENCOUNTER — Ambulatory Visit: Payer: Self-pay | Admitting: Orthopedic Surgery

## 2018-05-31 NOTE — H&P (Signed)
Jackson Hatfield is an 47 y.o. male.   Chief Complaint: R knee pain HPI: Reported by patient. Reason for Visit: (normal) visit for: (right knee MRI); The patient is 25 1/2 weeks out from when symptoms began. Severity: pain level 2/10 Timing: come and go  Past Medical History:  Diagnosis Date  . Hyperlipidemia   . Hypertension     No past surgical history on file.  Family History  Problem Relation Age of Onset  . COPD Mother   . Alcohol abuse Father   . Arthritis Father   . Diabetes Father   . Heart disease Father   . Hyperlipidemia Father   . Kidney disease Father   . Diabetes Brother   . Stroke Brother   . Alcohol abuse Brother   . Diabetes Brother   . Drug abuse Brother    Social History:  reports that he has never smoked. He has never used smokeless tobacco. He reports that he drinks alcohol. He reports that he does not use drugs.  Allergies:  Allergies  Allergen Reactions  . Naproxen Hives  . Penicillins Hives   Medications: Afluria Qd 2019-20 (36 mos up)(PF)60 mcg (15 mcg x4)/0.5 mL IM syringe albuterol sulfate 0.63 mg/3 mL solution for nebulization losartan 100 mg tablet lovastatin 10 mg tablet metFORMIN ER 500 mg tablet,extended release 24 hr sildenafil 50 mg tablet tamsulosin 0.4 mg capsule Ventolin HFA 90 mcg/actuation aerosol inhaler  Review of Systems  Constitutional: Negative.   HENT: Negative.   Eyes: Negative.   Respiratory: Negative.   Cardiovascular: Negative.   Gastrointestinal: Negative.   Genitourinary: Negative.   Musculoskeletal: Positive for joint pain.  Skin: Negative.   Neurological: Negative.   Psychiatric/Behavioral: Negative.     There were no vitals taken for this visit. Physical Exam  Constitutional: He is oriented to person, place, and time. He appears well-developed and well-nourished.  HENT:  Head: Normocephalic.  Eyes: Pupils are equal, round, and reactive to light.  Neck: Normal range of motion.  Cardiovascular:  Normal rate.  Respiratory: Effort normal.  GI: Soft.  Musculoskeletal:  Patient is a 47 year old male.  Constitutional: General Appearance: healthy-appearing and NAD.  Gait and Station: Appearance: antalgic gait.  Cardiovascular System: Arterial Pulses Right: femoral normal, popliteal normal, dorsalis pedis normal, and posterior tibialis normal. Edema Right: no edema. Varicosities Right: no varicosities. Varicosities Left: no varicosities and capillary refill test normal.  Lymph Nodes: Inspection/Palpation Right: no inguinal LAD.  Knees: Inspection Right: no deformity. Bony Palpation Right: no tenderness of the inferior pole patella, the superior pole patella, the tibial tubercle, the medial joint line, the lateral joint line, the medial tibial plateau, Gerdy's tubercle, or the neck of fibula. Soft Tissue Palpation Right: no tenderness of the quadriceps tendon, the prepatellar bursa, the patellar tendon, the medial collateral ligament, the saphenous nerve, the lateral collateral ligament, the infrapatellar tendon, or the common peroneal nerve. Active Range of Motion Right: normal. Stability Right: no laxity or ligamentous instability and anterior drawer sign negative, posterior drawer sign negative, and Lachman test negative. Special Tests Right: McMurray's test negative. Strength Right: no hamstring weakness or quadriceps weakness and flexion 5/5 and extension 5/5.  Skin: Right Lower Extremity: normal.  Neurologic: Ankle Reflex Right: normal (2). Knee Reflex Right: normal (2). Sensation on the Right: L2 normal, L3 normal, L4 normal, L5 normal, and S1 normal.  Psychiatric: Mood and Affect: active and alert and normal mood.  Neurological: He is alert and oriented to person, place, and time.  Skin:  Skin is warm and dry.    MRI demonstrates a complex tear of the medial meniscus body and posterior horn. Synovitis and a para meniscal cyst. 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  Patient demonstrates  symptomatic medial meniscus tear and he does have a para meniscal cyst.  Assessment/Plan Impression: Right knee meniscus tear, cyst, DJD He has mechanical symptoms.  We discussed living with the symptoms versus knee arthroscopy.  Mutually agreed to proceed with a knee arthroscopy. To the duration of his time in his symptoms.  I discussed the risk and benefits of knee arthroscopy including no changes in their symptoms worsening in their symptoms DVT, PE, anesthetic complications etc. Surgical possibilities include chondroplasty, microfracture, partial meniscectomy, plica excision etc. We also discussed the possible need for repeat arthroscopy in the future as well as possible continued treatment including corticosteroid injections and possible Visco supplementation. In addition we discussed the possibility of even eventually required a total knee replacement if significant arthritis encountered. Also indicating that it is an outpatient procedure. 1-2 days on crutches. Postoperative DVT prophylaxis with aspirin if tolerated. Follow-up in the office in 2 weeks following the surgery. Possible consideration of formal of supervised physical therapy as well.  We also discussed possible aspiration of his cyst or drainage of it intraoperatively  Had a history of sarcoidosis and a partial lobectomy in the past. Otherwise healthy.  Plan right knee scope, partial medial meniscectomy, debridement, drainage of cyst  Cecilie Kicks., PA-C for Dr. Tonita Cong 05/31/2018, 10:05 AM

## 2018-05-31 NOTE — H&P (View-Only) (Signed)
Jackson Hatfield is an 47 y.o. male.   Chief Complaint: R knee pain HPI: Reported by patient. Reason for Visit: (normal) visit for: (right knee MRI); The patient is 25 1/2 weeks out from when symptoms began. Severity: pain level 2/10 Timing: come and go  Past Medical History:  Diagnosis Date  . Hyperlipidemia   . Hypertension     No past surgical history on file.  Family History  Problem Relation Age of Onset  . COPD Mother   . Alcohol abuse Father   . Arthritis Father   . Diabetes Father   . Heart disease Father   . Hyperlipidemia Father   . Kidney disease Father   . Diabetes Brother   . Stroke Brother   . Alcohol abuse Brother   . Diabetes Brother   . Drug abuse Brother    Social History:  reports that he has never smoked. He has never used smokeless tobacco. He reports that he drinks alcohol. He reports that he does not use drugs.  Allergies:  Allergies  Allergen Reactions  . Naproxen Hives  . Penicillins Hives   Medications: Afluria Qd 2019-20 (36 mos up)(PF)60 mcg (15 mcg x4)/0.5 mL IM syringe albuterol sulfate 0.63 mg/3 mL solution for nebulization losartan 100 mg tablet lovastatin 10 mg tablet metFORMIN ER 500 mg tablet,extended release 24 hr sildenafil 50 mg tablet tamsulosin 0.4 mg capsule Ventolin HFA 90 mcg/actuation aerosol inhaler  Review of Systems  Constitutional: Negative.   HENT: Negative.   Eyes: Negative.   Respiratory: Negative.   Cardiovascular: Negative.   Gastrointestinal: Negative.   Genitourinary: Negative.   Musculoskeletal: Positive for joint pain.  Skin: Negative.   Neurological: Negative.   Psychiatric/Behavioral: Negative.     There were no vitals taken for this visit. Physical Exam  Constitutional: He is oriented to person, place, and time. He appears well-developed and well-nourished.  HENT:  Head: Normocephalic.  Eyes: Pupils are equal, round, and reactive to light.  Neck: Normal range of motion.  Cardiovascular:  Normal rate.  Respiratory: Effort normal.  GI: Soft.  Musculoskeletal:  Patient is a 47 year old male.  Constitutional: General Appearance: healthy-appearing and NAD.  Gait and Station: Appearance: antalgic gait.  Cardiovascular System: Arterial Pulses Right: femoral normal, popliteal normal, dorsalis pedis normal, and posterior tibialis normal. Edema Right: no edema. Varicosities Right: no varicosities. Varicosities Left: no varicosities and capillary refill test normal.  Lymph Nodes: Inspection/Palpation Right: no inguinal LAD.  Knees: Inspection Right: no deformity. Bony Palpation Right: no tenderness of the inferior pole patella, the superior pole patella, the tibial tubercle, the medial joint line, the lateral joint line, the medial tibial plateau, Gerdy's tubercle, or the neck of fibula. Soft Tissue Palpation Right: no tenderness of the quadriceps tendon, the prepatellar bursa, the patellar tendon, the medial collateral ligament, the saphenous nerve, the lateral collateral ligament, the infrapatellar tendon, or the common peroneal nerve. Active Range of Motion Right: normal. Stability Right: no laxity or ligamentous instability and anterior drawer sign negative, posterior drawer sign negative, and Lachman test negative. Special Tests Right: McMurray's test negative. Strength Right: no hamstring weakness or quadriceps weakness and flexion 5/5 and extension 5/5.  Skin: Right Lower Extremity: normal.  Neurologic: Ankle Reflex Right: normal (2). Knee Reflex Right: normal (2). Sensation on the Right: L2 normal, L3 normal, L4 normal, L5 normal, and S1 normal.  Psychiatric: Mood and Affect: active and alert and normal mood.  Neurological: He is alert and oriented to person, place, and time.  Skin:  Skin is warm and dry.    MRI demonstrates a complex tear of the medial meniscus body and posterior horn. Synovitis and a para meniscal cyst. 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  Patient demonstrates  symptomatic medial meniscus tear and he does have a para meniscal cyst.  Assessment/Plan Impression: Right knee meniscus tear, cyst, DJD He has mechanical symptoms.  We discussed living with the symptoms versus knee arthroscopy.  Mutually agreed to proceed with a knee arthroscopy. To the duration of his time in his symptoms.  I discussed the risk and benefits of knee arthroscopy including no changes in their symptoms worsening in their symptoms DVT, PE, anesthetic complications etc. Surgical possibilities include chondroplasty, microfracture, partial meniscectomy, plica excision etc. We also discussed the possible need for repeat arthroscopy in the future as well as possible continued treatment including corticosteroid injections and possible Visco supplementation. In addition we discussed the possibility of even eventually required a total knee replacement if significant arthritis encountered. Also indicating that it is an outpatient procedure. 1-2 days on crutches. Postoperative DVT prophylaxis with aspirin if tolerated. Follow-up in the office in 2 weeks following the surgery. Possible consideration of formal of supervised physical therapy as well.  We also discussed possible aspiration of his cyst or drainage of it intraoperatively  Had a history of sarcoidosis and a partial lobectomy in the past. Otherwise healthy.  Plan right knee scope, partial medial meniscectomy, debridement, drainage of cyst  Cecilie Kicks., PA-C for Dr. Tonita Cong 05/31/2018, 10:05 AM

## 2018-06-08 NOTE — Pre-Procedure Instructions (Signed)
Jackson Hatfield  06/08/2018      [id number] [address], [place] [address] Phone: [phone] Fax: [phone]  [place], [place] [address] Phone: [phone] Fax: [phone]    Your procedure is scheduled on June 18, 2018.  Report to [facility] Admitting at 1100 AM.  Call this number if you have problems the morning of surgery:  [phone]   Remember:  Do not eat or drink after midnight.    Take these medicines the morning of surgery with A SIP OF WATER  Albuterol nebulizer-if needed Albuterol inhaler-if needed -bring inhaler with you  7 days prior to surgery STOP taking any Aspirin (unless otherwise instructed by your surgeon), Aleve, Naproxen, Ibuprofen, Motrin, Advil, Goody's, BC's, all herbal medications, fish oil, and all vitamins    WHAT DO I DO ABOUT MY DIABETES MEDICATION?  Marland Kitchen Do not take oral diabetes medicines (pills) the morning of surgery-metformin (glucophage).  Reviewed and Endorsed by [facility] Patient Education Committee, August 2015  How to Manage Your Diabetes Before and After Surgery  Why is it important to control my blood sugar before and after surgery? . Improving blood sugar levels before and after surgery helps healing and can limit problems. . A way of improving blood sugar control is eating a healthy diet by: o  Eating less sugar and carbohydrates o  Increasing activity/exercise o  Talking with your doctor about reaching your blood sugar goals . High blood sugars (greater than 180 mg/dL) can raise your risk of infections and slow your recovery, so you will need to focus on controlling your diabetes during the weeks before surgery. . Make sure that the doctor who takes care of your diabetes knows about your planned surgery including the date and location.  How do I manage my blood sugar  before surgery? . Check your blood sugar at least 4 times a day, starting 2 days before surgery, to make sure that the level is not too high or low. o Check your blood sugar the morning of your surgery when you wake up and every 2 hours until you get to the Short Stay unit. . If your blood sugar is less than 70 mg/dL, you will need to treat for low blood sugar: o Do not take insulin. o Treat a low blood sugar (less than 70 mg/dL) with  cup of clear juice (cranberry or apple), 4 glucose tablets, OR glucose gel. Recheck blood sugar in 15 minutes after treatment (to make sure it is greater than 70 mg/dL). If your blood sugar is not greater than 70 mg/dL on recheck, call [phone] o  for further instructions. . Report your blood sugar to the short stay nurse when you get to Short Stay.  . If you are admitted to the hospital after surgery: o Your blood sugar will be checked by the staff and you will probably be given insulin after surgery (instead of oral diabetes medicines) to make sure you have good blood sugar levels. o The goal for blood sugar control after surgery is 80-180 mg/dL.   [facility]- Preparing For Surgery  Before surgery, you can play an important role. Because skin is not sterile, your skin needs to be as free of germs as possible. You can reduce the number of germs on your skin by washing with CHG (chlorahexidine gluconate) Soap before surgery.  CHG is an antiseptic cleaner which kills germs and bonds with the skin to continue killing germs even after washing.    Oral Hygiene is also important to reduce your risk of infection.  Remember - BRUSH YOUR TEETH THE MORNING OF SURGERY WITH YOUR REGULAR TOOTHPASTE  Please do not use if you have an allergy to CHG or antibacterial soaps. If your skin becomes reddened/irritated stop using the CHG.  Do not shave (including legs and underarms) for at least 48 hours prior to first CHG shower. It is OK to shave your face.  Please follow  these instructions carefully.   1. Shower the NIGHT BEFORE SURGERY and the MORNING OF SURGERY with CHG.   2. If you chose to wash your hair, wash your hair first as usual with your normal shampoo.  3. After you shampoo, rinse your hair and body thoroughly to remove the shampoo.  4. Use CHG as you would any other liquid soap. You can apply CHG directly to the skin and wash gently with a scrungie or a clean washcloth.   5. Apply the CHG Soap to your body ONLY FROM THE NECK DOWN.  Do not use on open wounds or open sores. Avoid contact with your eyes, ears, mouth and genitals (private parts). Wash Face and genitals (private parts)  with your normal soap.  6. Wash thoroughly, paying special attention to the area where your surgery will be performed.  7. Thoroughly rinse your body with warm water from the neck down.  8. DO NOT shower/wash with your normal soap after using and rinsing off the CHG Soap.  9. Pat yourself dry with a CLEAN TOWEL.  10. Wear CLEAN PAJAMAS to bed the night before surgery, wear comfortable clothes the morning of surgery  11. Place CLEAN SHEETS on your bed the night of your first shower and DO NOT SLEEP WITH PETS.  Day of Surgery:  Do not apply any deodorants/lotions.  Please wear clean clothes to the hospital/surgery center.   Remember to brush your teeth WITH YOUR REGULAR TOOTHPASTE.   Do not wear jewelry, make-up or nail polish.  Do not wear lotions, powders, or perfumes, or deodorant.  Do not shave 48 hours prior to surgery.  Men may shave face and neck.  Do not bring valuables to the hospital.  [facility] is not responsible for any belongings or valuables.  Contacts, dentures or bridgework may not be worn into surgery.  Leave your suitcase in the car.  After surgery it may be brought to your room.  For patients admitted to the hospital, discharge time will be determined by your treatment team.  Patients discharged the day of surgery will not be allowed  to drive home.    Please read over the following fact sheets that you were given.

## 2018-06-09 ENCOUNTER — Encounter (HOSPITAL_COMMUNITY): Payer: Self-pay

## 2018-06-09 ENCOUNTER — Encounter (HOSPITAL_COMMUNITY)
Admission: RE | Admit: 2018-06-09 | Discharge: 2018-06-09 | Disposition: A | Discharge status: Home / Self Care | Payer: 59 | Source: Ambulatory Visit | Attending: Specialist | Admitting: Specialist

## 2018-06-09 DIAGNOSIS — Z01818 Encounter for other preprocedural examination: Secondary | ICD-10-CM | POA: Diagnosis not present

## 2018-06-09 HISTORY — DX: Sarcoidosis, unspecified: D86.9

## 2018-06-09 HISTORY — DX: Prediabetes: R73.03

## 2018-06-09 HISTORY — DX: Sleep apnea, unspecified: G47.30

## 2018-06-09 HISTORY — DX: Dyspnea, unspecified: R06.00

## 2018-06-09 HISTORY — DX: Pneumonia, unspecified organism: J18.9

## 2018-06-09 LAB — BASIC METABOLIC PANEL
Anion gap: 12 (ref 5–15)
BUN: 10 mg/dL (ref 6–20)
CO2: 21 mmol/L — ABNORMAL LOW (ref 22–32)
Calcium: 9.2 mg/dL (ref 8.9–10.3)
Chloride: 104 mmol/L (ref 98–111)
Creatinine, Ser: 0.74 mg/dL (ref 0.61–1.24)
GFR calc Af Amer: >60 mL/min (ref >60)
GFR calc non Af Amer: >60 mL/min (ref >60)
Glucose, Bld: 141 mg/dL — ABNORMAL HIGH (ref 70–99)
Potassium: 4.6 mmol/L (ref 3.5–5.1)
Sodium: 137 mmol/L (ref 135–145)

## 2018-06-09 LAB — CBC
HCT: 42.3 % (ref 39.0–52.0)
HEMOGLOBIN: 13.6 g/dL (ref 13.0–17.0)
MCH: 28 pg (ref 26.0–34.0)
MCHC: 32.2 g/dL (ref 30.0–36.0)
MCV: 87 fL (ref 80.0–100.0)
Platelets: 317 10*3/uL (ref 150–400)
RBC: 4.86 MIL/uL (ref 4.22–5.81)
RDW: 12.6 % (ref 11.5–15.5)
WBC: 7.1 10*3/uL (ref 4.0–10.5)
nRBC: 0 % (ref 0.0–0.2)

## 2018-06-09 LAB — HEMOGLOBIN A1C
Hgb A1c MFr Bld: 6.4 % — ABNORMAL HIGH (ref 4.8–5.6)
Mean Plasma Glucose: 136.98 mg/dL

## 2018-06-09 LAB — GLUCOSE, CAPILLARY: Glucose-Capillary: 124 mg/dL — ABNORMAL HIGH (ref 70–99)

## 2018-06-09 NOTE — Progress Notes (Signed)
PCP: Elise Benne  No cardiologist  Stress test 8 yrs. Ago-states normal, doesn't remember why it was done.   Last sleep study 07/2017 [facility] , in epic  Pt. Doesn't check blood sugars at home. He doesn't have a machine.

## 2018-06-10 DIAGNOSIS — G4733 Obstructive sleep apnea (adult) (pediatric): Secondary | ICD-10-CM | POA: Diagnosis not present

## 2018-06-10 NOTE — Progress Notes (Signed)
Anesthesia Chart Review   Case:  [id number] Date/Time:  06/18/18 1245   Procedure:  Right knee arthroscopy, partial medial menisectomy, debridement and drainage of cyst (Right ) - 60 mins   Anesthesia type:  General   Pre-op diagnosis:  Right knee medial menisectomy, degenerative joint disease   Location:  [facility] OR ROOM 07 / [place] OR   Surgeon:  [name], MD      DISCUSSION: 47 yo male never smoker with h/o DM II, HTN, OSA (uses CPAP with good compliance), Sarcoidosis (diagnosed after mediastinoscopy in 2006, followed yearly by pulmonology), previous left lower lobectomy 2003, and hyperlipidemia who is scheduled for above surgery on 06/18/18.   Pt followed by Pulmonologist Rigoberto Noel, MD regarding sarcoidosis.  Per last visit on 10/05/17 OSA is well controlled with good CPAP compliance.  Sarcoidosis with no significant sx, ACE level normal, chest xray with BB interstitial markings, follow up for yearly exams with repeat PFTs and CT chest if abnormal.   EKG during PAT with Septal infarct, age undetermined. No previous tracing for comparison.  Pt is asymptomatic with no exertional chest pain.  Discussed with Dr. Ermalene Postin 06/10/18, ok to proceed without further cardiac workup at this time.  EKG forwarded to PCP [name], [name], PA-C for further workup if patient becomes symptomatic.   Pt ok to proceed with planned procedure barring acute status change.   VS: BP (!) 136/91   Pulse 73   Temp 36.7 C   Resp 20   Ht 6' (1.829 m)   Wt (!) 141.9 kg   SpO2 95%   BMI 42.44 kg/m   PROVIDERS: [name], [name], PA-C is PCP  Rigoberto Noel, MD is Pulmonologist   LABS: Labs reviewed: Acceptable for surgery. (all labs ordered are listed, but only abnormal results are displayed)  Labs Reviewed  GLUCOSE, CAPILLARY - Abnormal; Notable for the following components:      Result Value   Glucose-Capillary 124 (*)    All other components within normal limits  BASIC METABOLIC PANEL - Abnormal; Notable for  the following components:   CO2 21 (*)    Glucose, Bld 141 (*)    All other components within normal limits  HEMOGLOBIN A1C - Abnormal; Notable for the following components:   Hgb A1c MFr Bld 6.4 (*)    All other components within normal limits  CBC     IMAGES: Chest Xray 08/06/17 FINDINGS: The lungs are mildly hyperinflated. The interstitial markings are increased bilaterally greatest at both bases. There is no pleural effusion. No mediastinal nor hilar lymphadenopathy is observed. The heart and pulmonary vascularity are normal. The bony thorax exhibits no acute abnormality.  IMPRESSION: Increased interstitial markings especially at both bases compatible with the history of sarcoidosis. No lymphadenopathy.   EKG 06/09/18: Normal sinus rhythm Septal infarct , age undetermined Abnormal ECG No previous tracing  CV:  Past Medical History:  Diagnosis Date  . Dyspnea    exerting self,ie: running  . Hyperlipidemia   . Hypertension   . Pneumonia   . Pre-diabetes   . Sarcoidosis   . Sleep apnea     Past Surgical History:  Procedure Laterality Date  . LOBECTOMY Left 2002   lower left  ( cyst on the lobe)in [facility] in [place], [place]    MEDICATIONS: . albuterol (ACCUNEB) 0.63 MG/3ML nebulizer solution  . albuterol (PROVENTIL HFA;VENTOLIN HFA) 108 (90 Base) MCG/ACT inhaler  . losartan (COZAAR) 100 MG tablet  . lovastatin (MEVACOR) 10 MG  tablet  . metFORMIN (GLUCOPHAGE-XR) 500 MG 24 hr tablet  . Multiple Vitamins-Minerals (MENS ONE DAILY PO)  . sildenafil (REVATIO) 20 MG tablet  . tamsulosin (FLOMAX) 0.4 MG CAPS capsule   No current facility-administered medications for this encounter.      Maia Plan [facility] Short Stay/Anesthesia [phone] 06/10/18 4:27 PM

## 2018-06-17 MED ORDER — VANCOMYCIN HCL 10 G IV SOLR
1500.0000 mg | INTRAVENOUS | Status: AC
  Administered 2018-06-18: 1500 mg via INTRAVENOUS
  Filled 2018-06-17: qty 1500

## 2018-06-18 ENCOUNTER — Other Ambulatory Visit: Payer: Self-pay

## 2018-06-18 ENCOUNTER — Ambulatory Visit (HOSPITAL_COMMUNITY)
Admission: RE | Admit: 2018-06-18 | Discharge: 2018-06-18 | Disposition: A | Discharge status: Home / Self Care | Payer: 59 | Attending: Specialist | Admitting: Specialist

## 2018-06-18 ENCOUNTER — Encounter (HOSPITAL_COMMUNITY): Payer: Self-pay | Admitting: Anesthesiology

## 2018-06-18 ENCOUNTER — Encounter (HOSPITAL_COMMUNITY)
Admission: RE | Disposition: A | Discharge status: Home / Self Care | Payer: Self-pay | Source: Home / Self Care | Attending: Specialist

## 2018-06-18 ENCOUNTER — Ambulatory Visit (HOSPITAL_COMMUNITY): Payer: 59 | Admitting: Physician Assistant

## 2018-06-18 ENCOUNTER — Ambulatory Visit (HOSPITAL_COMMUNITY): Payer: 59 | Admitting: Anesthesiology

## 2018-06-18 DIAGNOSIS — I10 Essential (primary) hypertension: Secondary | ICD-10-CM | POA: Insufficient documentation

## 2018-06-18 DIAGNOSIS — Z79899 Other long term (current) drug therapy: Secondary | ICD-10-CM | POA: Insufficient documentation

## 2018-06-18 DIAGNOSIS — M2241 Chondromalacia patellae, right knee: Secondary | ICD-10-CM | POA: Diagnosis not present

## 2018-06-18 DIAGNOSIS — M1711 Unilateral primary osteoarthritis, right knee: Secondary | ICD-10-CM | POA: Insufficient documentation

## 2018-06-18 DIAGNOSIS — Z886 Allergy status to analgesic agent status: Secondary | ICD-10-CM | POA: Insufficient documentation

## 2018-06-18 DIAGNOSIS — M67461 Ganglion, right knee: Secondary | ICD-10-CM | POA: Diagnosis not present

## 2018-06-18 DIAGNOSIS — Z7984 Long term (current) use of oral hypoglycemic drugs: Secondary | ICD-10-CM | POA: Diagnosis not present

## 2018-06-18 DIAGNOSIS — E785 Hyperlipidemia, unspecified: Secondary | ICD-10-CM | POA: Diagnosis not present

## 2018-06-18 DIAGNOSIS — X58XXXA Exposure to other specified factors, initial encounter: Secondary | ICD-10-CM | POA: Diagnosis not present

## 2018-06-18 DIAGNOSIS — Z6841 Body Mass Index (BMI) 40.0 and over, adult: Secondary | ICD-10-CM | POA: Insufficient documentation

## 2018-06-18 DIAGNOSIS — M94261 Chondromalacia, right knee: Secondary | ICD-10-CM | POA: Diagnosis not present

## 2018-06-18 DIAGNOSIS — S83231A Complex tear of medial meniscus, current injury, right knee, initial encounter: Secondary | ICD-10-CM | POA: Insufficient documentation

## 2018-06-18 DIAGNOSIS — S83241A Other tear of medial meniscus, current injury, right knee, initial encounter: Secondary | ICD-10-CM | POA: Diagnosis present

## 2018-06-18 DIAGNOSIS — Z880 Allergy status to penicillin: Secondary | ICD-10-CM | POA: Diagnosis not present

## 2018-06-18 HISTORY — PX: KNEE ARTHROSCOPY WITH MEDIAL MENISECTOMY: SHX5651

## 2018-06-18 LAB — GLUCOSE, CAPILLARY
Glucose-Capillary: 88 mg/dL (ref 70–99)
Glucose-Capillary: 101 mg/dL — ABNORMAL HIGH (ref 70–99)

## 2018-06-18 SURGERY — ARTHROSCOPY, KNEE, WITH MEDIAL MENISCECTOMY
Anesthesia: General | Laterality: Right

## 2018-06-18 MED ORDER — EPINEPHRINE PF 1 MG/ML IJ SOLN
INTRAMUSCULAR | Status: AC
  Filled 2018-06-18: qty 1

## 2018-06-18 MED ORDER — PROPOFOL 10 MG/ML IV BOLUS
INTRAVENOUS | Status: AC
  Filled 2018-06-18: qty 40

## 2018-06-18 MED ORDER — ONDANSETRON HCL 4 MG/2ML IJ SOLN
INTRAMUSCULAR | Status: DC | PRN
  Administered 2018-06-18: 4 mg via INTRAVENOUS

## 2018-06-18 MED ORDER — OXYCODONE-ACETAMINOPHEN 5-325 MG PO TABS: 1.0000 | ORAL_TABLET | ORAL | 0 refills | Status: AC | PRN

## 2018-06-18 MED ORDER — EPINEPHRINE PF 1 MG/ML IJ SOLN
INTRAMUSCULAR | Status: DC | PRN
  Administered 2018-06-18: 1 mg via INTRAMUSCULAR

## 2018-06-18 MED ORDER — FENTANYL CITRATE (PF) 250 MCG/5ML IJ SOLN
INTRAMUSCULAR | Status: AC
  Filled 2018-06-18: qty 5

## 2018-06-18 MED ORDER — PROPOFOL 10 MG/ML IV BOLUS
INTRAVENOUS | Status: DC | PRN
  Administered 2018-06-18: 300 mg via INTRAVENOUS

## 2018-06-18 MED ORDER — CHLORHEXIDINE GLUCONATE 4 % EX LIQD: 60.0000 mL | Freq: Once | CUTANEOUS | Status: DC

## 2018-06-18 MED ORDER — SODIUM CHLORIDE 0.9 % IR SOLN
Status: DC | PRN
  Administered 2018-06-18: 3000 mL

## 2018-06-18 MED ORDER — ACETAMINOPHEN 500 MG PO TABS
1000.0000 mg | ORAL_TABLET | Freq: Once | ORAL | Status: AC
  Administered 2018-06-18: 1000 mg via ORAL

## 2018-06-18 MED ORDER — ASPIRIN EC 325 MG PO TBEC: 325.0000 mg | DELAYED_RELEASE_TABLET | Freq: Every day | ORAL | 0 refills | Status: DC

## 2018-06-18 MED ORDER — LACTATED RINGERS IV SOLN
INTRAVENOUS | Status: DC
  Administered 2018-06-18 (×2): via INTRAVENOUS

## 2018-06-18 MED ORDER — ACETAMINOPHEN 500 MG PO TABS
ORAL_TABLET | ORAL | Status: AC
  Administered 2018-06-18: 1000 mg via ORAL
  Filled 2018-06-18: qty 2

## 2018-06-18 MED ORDER — BUPIVACAINE-EPINEPHRINE 0.5% -1:200000 IJ SOLN
INTRAMUSCULAR | Status: DC | PRN
  Administered 2018-06-18: 30 mL

## 2018-06-18 MED ORDER — BUPIVACAINE-EPINEPHRINE (PF) 0.25% -1:200000 IJ SOLN
INTRAMUSCULAR | Status: AC
  Filled 2018-06-18: qty 30

## 2018-06-18 MED ORDER — LIDOCAINE HCL (CARDIAC) PF 100 MG/5ML IV SOSY
PREFILLED_SYRINGE | INTRAVENOUS | Status: DC | PRN
  Administered 2018-06-18: 50 mg via INTRAVENOUS

## 2018-06-18 MED ORDER — MIDAZOLAM HCL 2 MG/2ML IJ SOLN
INTRAMUSCULAR | Status: AC
  Filled 2018-06-18: qty 2

## 2018-06-18 SURGICAL SUPPLY — 40 items
ABLATOR ASPIRATE 50D MULTI-PRT (SURGICAL WAND) ×2 IMPLANT
BANDAGE ACE 6X5 VEL STRL LF (GAUZE/BANDAGES/DRESSINGS) ×2 IMPLANT
BANDAGE ESMARK 6X9 LF (GAUZE/BANDAGES/DRESSINGS) ×1 IMPLANT
BLADE CLIPPER SURG (BLADE) IMPLANT
BLADE CUDA 5.5 (BLADE) IMPLANT
BLADE GREAT WHITE 4.2 (BLADE) ×2 IMPLANT
BNDG ESMARK 6X9 LF (GAUZE/BANDAGES/DRESSINGS) ×2
BNDG GAUZE ELAST 4 BULKY (GAUZE/BANDAGES/DRESSINGS) ×2 IMPLANT
BUR OVAL 6.0 (BURR) IMPLANT
COVER WAND RF STERILE (DRAPES) ×2 IMPLANT
CUFF TOURNIQUET SINGLE 34IN LL (TOURNIQUET CUFF) IMPLANT
CUFF TOURNIQUET SINGLE 44IN (TOURNIQUET CUFF) IMPLANT
DRAPE ARTHROSCOPY W/POUCH 114 (DRAPES) ×2 IMPLANT
DRAPE U-SHAPE 47X51 STRL (DRAPES) ×2 IMPLANT
DURAPREP 26ML APPLICATOR (WOUND CARE) ×2 IMPLANT
FILTER STRAW FLUID ASPIR (MISCELLANEOUS) ×2 IMPLANT
GAUZE SPONGE 4X4 12PLY STRL (GAUZE/BANDAGES/DRESSINGS) ×2 IMPLANT
GAUZE XEROFORM 1X8 LF (GAUZE/BANDAGES/DRESSINGS) ×2 IMPLANT
GLOVE BIO SURGEON STRL SZ 6.5 (GLOVE) ×2 IMPLANT
GLOVE SURG SS PI 8.0 STRL IVOR (GLOVE) ×4 IMPLANT
GOWN EXTRA PROTECTION XL (GOWNS) ×2 IMPLANT
GOWN STRL REUS W/ TWL LRG LVL3 (GOWN DISPOSABLE) ×4 IMPLANT
GOWN STRL REUS W/TWL LRG LVL3 (GOWN DISPOSABLE) ×4
KIT BASIN OR (CUSTOM PROCEDURE TRAY) ×2 IMPLANT
KIT TURNOVER KIT B (KITS) ×2 IMPLANT
MANIFOLD NEPTUNE II (INSTRUMENTS) ×2 IMPLANT
NEEDLE 18GX1X1/2 (RX/OR ONLY) (NEEDLE) ×2 IMPLANT
PACK ARTHROSCOPY DSU (CUSTOM PROCEDURE TRAY) ×2 IMPLANT
PAD ARMBOARD 7.5X6 YLW CONV (MISCELLANEOUS) ×4 IMPLANT
PROBE BIPOLAR ARTHRO 85MM 30D (MISCELLANEOUS) ×2 IMPLANT
SET ARTHROSCOPY TUBING (MISCELLANEOUS) ×1
SET ARTHROSCOPY TUBING LN (MISCELLANEOUS) ×1 IMPLANT
SPONGE LAP 4X18 RFD (DISPOSABLE) ×2 IMPLANT
SUT ETHILON 4 0 PS 2 18 (SUTURE) ×2 IMPLANT
SYR 30ML LL (SYRINGE) ×2 IMPLANT
SYR 3ML LL SCALE MARK (SYRINGE) ×2 IMPLANT
TOWEL OR 17X24 6PK STRL BLUE (TOWEL DISPOSABLE) ×2 IMPLANT
TUBE CONNECTING 12X1/4 (SUCTIONS) ×2 IMPLANT
WAND HAND CNTRL MULTIVAC 90 (MISCELLANEOUS) IMPLANT
WATER STERILE IRR 1000ML POUR (IV SOLUTION) ×2 IMPLANT

## 2018-06-18 NOTE — Brief Op Note (Signed)
06/18/2018  2:03 PM  PATIENT:  Jackson Hatfield  47 y.o. male  PRE-OPERATIVE DIAGNOSIS:  Right knee medial menisectomy, degenerative joint disease  POST-OPERATIVE DIAGNOSIS:  * No post-op diagnosis entered *  PROCEDURE:  Procedure(s) with comments: Right knee arthroscopy, partial medial menisectomy, debridement and drainage of cyst (Right) - 60 mins  SURGEON:  Surgeon(s) and Role:    [name], MD - Primary  PHYSICIAN ASSISTANT:   ASSISTANTS: Biss[place]   ANESTHESIA:   general  EBL:  0 mL   BLOOD ADMINISTERED:none  DRAINS: none   LOCAL MEDICATIONS USED:  MARCAINE     SPECIMEN:  No Specimen  DISPOSITION OF SPECIMEN:  N/A  COUNTS:  YES  TOURNIQUET:  * Missing tourniquet times found for documented tourniquets in log: [id number] *  DICTATION: .Other Dictation: Dictation Number [id number].Marland Kitchen.(number cutoff)  PLAN OF CARE: Discharge to home after PACU  PATIENT DISPOSITION:  PACU - hemodynamically stable.   Delay start of Pharmacological VTE agent (>24hrs) due to surgical blood loss or risk of bleeding: no

## 2018-06-18 NOTE — Anesthesia Procedure Notes (Signed)
Procedure Name: LMA Insertion Date/Time: 06/18/2018 1:07 PM Performed by: Eligha Bridegroom, CRNA Pre-anesthesia Checklist: Patient identified, Emergency Drugs available, Suction available, Patient being monitored and Timeout performed Patient Re-evaluated:Patient Re-evaluated prior to induction Oxygen Delivery Method: Circle system utilized Induction Type: IV induction LMA: LMA inserted LMA Size: 4.5 Placement Confirmation: positive ETCO2 and breath sounds checked- equal and bilateral Tube secured with: Tape Dental Injury: Teeth and Oropharynx as per pre-operative assessment

## 2018-06-18 NOTE — Discharge Instructions (Signed)
ARTHROSCOPIC KNEE SURGERY HOME CARE INSTRUCTIONS   PAIN You will be expected to have a moderate amount of pain in the affected knee for approximately two weeks.  However, the first two to four days will be the most severe in terms of the pain you will experience.  Prescriptions have been provided for you to take as needed for the pain.  The pain can be markedly reduced by using the ice/compressive bandage given.  Exchange the ice packs whenever they thaw.  During the night, keep the bandage on because it will still provide some compression for the swelling.  Also, keep the leg elevated on pillows above your heart, and this will help alleviate the pain and swelling.  MEDICATION Prescriptions have been provided to take as needed for pain. To prevent blood clots, take Aspirin 325mg  daily with a meal if not on a blood thinner and if no history of stomach ulcers.  ACTIVITY It is preferred that you stay on bedrest for approximately 24 hours.  However, you may go to the bathroom with help.  After this, you can start to be up and about progressively more.  Remember that the swelling may still increase after three to four days if you are up and doing too much.  You may put as much weight on the affected leg as pain will allow.  Use your crutches for comfort and safety.  However, as soon as you are able, you may discard the crutches and go without them.   DRESSING Keep the current dressing as dry as possible.  Two days after your surgery, you may remove the ice/compressive wrap, and surgical dressing.  You may now take a shower, but do not scrub the sounds directly with soap.  Let water rinse over these and gently wipe with your hand.  Reapply band-aids over the puncture wounds and more gauze if needed.  A slight amount of thin drainage can be normal at this time, and do not let it frighten you.  Reapply the ice/compressive wrap.  You may now repeat this every day each time you shower.  SYMPTOMS TO REPORT TO  YOUR DOCTOR  -Extreme pain.  -Extreme swelling.  -Temperature above 101 degrees that does not come down with acetaminophen     (Tylenol).  -Any changes in the feeling, color or movement of your toes.  -Extreme redness, heat, swelling or drainage at your incision  EXERCISE It is preferred that you begin to exercise on the day of your surgery.  Straight leg raises and short arc quads should be begun the afternoon or evening of surgery and continued until you come back for your follow-up appointment.   Attached is an instruction sheet on how to perform these two simple exercises.  Do these at least three times per day if not more.  You may bend your knee as much as is comfortable.  The puncture wounds may occasionally be slightly uncomfortable with bending of the knee.  Do not let this frighten you.  It is important to keep your knee motion, but do not overdo it.  If you have significant pain, simply do not bend the knee as far.   You will be given more exercises to perform at your first return visit.    RETURN APPOINTMENT Please make an appointment to be seen by your doctor 10-14 days from your surgery.  Patient Signature:  ________________________________________________________  Nurse's Signature:  ________________________________________________________

## 2018-06-18 NOTE — Transfer of Care (Signed)
Immediate Anesthesia Transfer of Care Note  Patient: Jackson Hatfield  Procedure(s) Performed: Right knee arthroscopy, partial medial menisectomy, debridement and drainage of cyst (Right )  Patient Location: PACU  Anesthesia Type:General  Level of Consciousness: awake, alert  and oriented  Airway & Oxygen Therapy: Patient Spontanous Breathing and Patient connected to nasal cannula oxygen  Post-op Assessment: Report given to RN and Post -op Vital signs reviewed and stable  Post vital signs: Reviewed and stable  Last Vitals:  Vitals Value Taken Time  BP 109/59 06/18/2018  2:13 PM  Temp    Pulse    Resp 17 06/18/2018  2:15 PM  SpO2    Vitals shown include unvalidated device data.  Last Pain:  Vitals:   06/18/18 1117  TempSrc: Oral  PainSc: 0-No pain      Patients Stated Pain Goal: 4 (94/32/76 1470)  Complications: No apparent anesthesia complications

## 2018-06-18 NOTE — Anesthesia Preprocedure Evaluation (Addendum)
Anesthesia Evaluation  Patient identified by MRN, date of birth, ID band Patient awake    Reviewed: Allergy & Precautions, NPO status , Patient's Chart, lab work & pertinent test results  Airway Mallampati: IV  TM Distance: >3 FB Neck ROM: Full    Dental no notable dental hx.    Pulmonary sleep apnea and Continuous Positive Airway Pressure Ventilation ,    Pulmonary exam normal breath sounds clear to auscultation       Cardiovascular hypertension, Pt. on medications Normal cardiovascular exam Rhythm:Regular Rate:Normal  ECG: rate 77. Normal sinus rhythm Septal infarct, age undetermined   Neuro/Psych Sarcoidosisnegative neurological ROS     GI/Hepatic negative GI ROS, Neg liver ROS,   Endo/Other  Morbid obesity  Renal/GU negative Renal ROS     Musculoskeletal HLD   Abdominal (+) + obese,   Peds  Hematology negative hematology ROS (+)   Anesthesia Other Findings Right knee medial menisectomy, degenerative joint disease  Reproductive/Obstetrics                            Anesthesia Physical Anesthesia Plan  ASA: III  Anesthesia Plan: General   Post-op Pain Management:    Induction: Intravenous  PONV Risk Score and Plan: 2 and Ondansetron, Dexamethasone, Midazolam and Treatment may vary due to age or medical condition  Airway Management Planned: LMA  Additional Equipment:   Intra-op Plan:   Post-operative Plan: Extubation in OR  Informed Consent: I have reviewed the patients History and Physical, chart, labs and discussed the procedure including the risks, benefits and alternatives for the proposed anesthesia with the patient or authorized representative who has indicated his/her understanding and acceptance.   Dental advisory given  Plan Discussed with: CRNA  Anesthesia Plan Comments:         Anesthesia Quick Evaluation

## 2018-06-18 NOTE — Interval H&P Note (Signed)
History and Physical Interval Note:  06/18/2018 12:39 PM  Jackson Hatfield  has presented today for surgery, with the diagnosis of Right knee medial menisectomy, degenerative joint disease  The various methods of treatment have been discussed with the patient and family. After consideration of risks, benefits and other options for treatment, the patient has consented to  Procedure(s) with comments: Right knee arthroscopy, partial medial menisectomy, debridement and drainage of cyst (Right) - 60 mins as a surgical intervention .  The patient's history has been reviewed, patient examined, no change in status, stable for surgery.  I have reviewed the patient's chart and labs.  Questions were answered to the patient's satisfaction.     Jackson Hatfield

## 2018-06-18 NOTE — Anesthesia Postprocedure Evaluation (Signed)
Anesthesia Post Note  Patient: Jackson Hatfield  Procedure(s) Performed: Right knee arthroscopy, partial medial menisectomy, debridement and drainage of cyst (Right )     Patient location during evaluation: PACU Anesthesia Type: General Level of consciousness: awake and alert Pain management: pain level controlled Vital Signs Assessment: post-procedure vital signs reviewed and stable Respiratory status: spontaneous breathing, nonlabored ventilation, respiratory function stable and patient connected to nasal cannula oxygen Cardiovascular status: blood pressure returned to baseline and stable Postop Assessment: no apparent nausea or vomiting Anesthetic complications: no    Last Vitals:  Vitals:   06/18/18 1458 06/18/18 1533  BP: 108/71 111/76  Pulse: 65 65  Resp: 16 18  Temp: 36.7 C   SpO2: 100% 100%    Last Pain:  Vitals:   06/18/18 1533  TempSrc:   PainSc: 0-No pain                 Ryan P Ellender

## 2018-06-19 ENCOUNTER — Encounter (HOSPITAL_COMMUNITY): Payer: Self-pay | Admitting: Specialist

## 2018-06-19 NOTE — Op Note (Signed)
Jackson Hatfield, Jackson Hatfield MEDICAL RECORD [id number] ACCOUNT [id number] DATE OF BIRTH:Oct 31, 1970 FACILITY: [facility] LOCATION: [facility] PHYSICIAN:Donnald Tabar Windy Kalata, MD  OPERATIVE REPORT  DATE OF PROCEDURE:  06/18/2018  PREOPERATIVE DIAGNOSES:  Medial meniscus tear, right knee.  POSTOPERATIVE DIAGNOSES:   1.  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   2.  Ganglion cyst medial compartment.  PROCEDURES PERFORMED:   1.  Right knee arthroscopy, partial medial meniscectomy, chondroplasty of patella, medial femoral condyle, medial tibial plateau. 2.  Evacuation of ganglion cyst.  ANESTHESIA:  General.  ASSISTANT:  Lacie Draft, PA  INDICATIONS :  A pleasant 48 year old male with ganglion cyst medial compartment.  He had an MRI indicating meniscus tear, a parameniscal cyst.  Refractory conservative treatment.  MRI with locking and giving way.  Is indicated for knee arthroscopy,  partial meniscectomy drainage of the cyst.  Risks and benefits discussed including bleeding, infection, damage to neurovascular structures, no change in symptoms, worsening symptoms, DVT, PE, anesthetic complications, etc.  DESCRIPTION OF PROCEDURE:  With the patient in supine position, after induction of adequate general anesthesia, 1.5 grams of vancomycin.  The right lower extremity was prepped and draped in the usual sterile fashion.  A lateral parapatellar portal was  fashioned with a #11 blade.  Ingress cannula atraumatically placed.  Irrigant was utilized to insufflate the joint.  Under direct visualization, a medial parapatellar portal was fashioned with a #11 blade after localization with an 18-gauge needle  sparing medial meniscus.  Noted some grade III changes of the tibial plateau and femoral condyle.  There was a flap tear of the medial meniscus that extended into the submeniscal recess.  It was complex and extending the entire middle  third of the  meniscus.  Introduced a basket resected to a stable base.  Further contoured with a 3.5 shaver and an ArthroWand.  We lifted up underneath the meniscus probing it,  lifting it up and then compressing medially to evacuate the cyst.  Not much fluid was  extravasated.  After further contouring, it was fairly complex.  We resected approximately one half of the mid half of the meniscus, contoured it on either end.  The remnant was stable to probe palpation.  Light chondroplasty performed of minimal grade  III changes femoral condyle and tibial plateau.  ACL was unremarkable.  Lateral compartment revealed normal femoral condyle, tibial plateau and meniscus stable to probe palpation without evidence of tearing.  Suprapatellar pouch, grade III changes minor of the medial facet of the patella.  Light chondroplasty performed here.  Normal patellofemoral tracking.  Gutters unremarkable.  We revisited all compartments.  No further pathology amenable to arthroscopic intervention and therefore removed all instrumentation.  Portals were closed with 4-0 nylon simple sutures.  Marcaine 0.25% with epinephrine was infiltrated in joint.  Wound  was dressed sterilely without difficulty and transported to the recovery room in satisfactory condition.  The patient tolerated the procedure well.  No complications.   Assistant Lacie Draft PA was used throughout the case with the patient's elevated BMI to retract the knee and to provide a valgus stress to the knee to resect the meniscus.  AN/NUANCE  D:06/18/2018 T:06/19/2018 JOB:[id number]

## 2018-07-11 DIAGNOSIS — G4733 Obstructive sleep apnea (adult) (pediatric): Secondary | ICD-10-CM | POA: Diagnosis not present

## 2018-07-25 ENCOUNTER — Encounter: Payer: Self-pay | Admitting: Medical

## 2018-08-03 ENCOUNTER — Ambulatory Visit (INDEPENDENT_AMBULATORY_CARE_PROVIDER_SITE_OTHER): Payer: 59 | Admitting: Medical

## 2018-08-03 ENCOUNTER — Encounter: Payer: Self-pay | Admitting: Medical

## 2018-08-03 ENCOUNTER — Telehealth: Payer: Self-pay | Admitting: Medical

## 2018-08-03 VITALS — BP 130/69 | HR 72 | Temp 98.2°F | Resp 16 | Ht 76.0 in | Wt 313.4 lb

## 2018-08-03 DIAGNOSIS — R350 Frequency of micturition: Secondary | ICD-10-CM | POA: Diagnosis not present

## 2018-08-03 DIAGNOSIS — G4730 Sleep apnea, unspecified: Secondary | ICD-10-CM

## 2018-08-03 DIAGNOSIS — Z1283 Encounter for screening for malignant neoplasm of skin: Secondary | ICD-10-CM | POA: Diagnosis not present

## 2018-08-03 DIAGNOSIS — Z125 Encounter for screening for malignant neoplasm of prostate: Secondary | ICD-10-CM | POA: Diagnosis not present

## 2018-08-03 DIAGNOSIS — E119 Type 2 diabetes mellitus without complications: Secondary | ICD-10-CM | POA: Diagnosis not present

## 2018-08-03 DIAGNOSIS — Z0000 Encounter for general adult medical examination without abnormal findings: Secondary | ICD-10-CM | POA: Diagnosis not present

## 2018-08-03 DIAGNOSIS — Z113 Encounter for screening for infections with a predominantly sexual mode of transmission: Secondary | ICD-10-CM

## 2018-08-03 LAB — CBC WITH DIFFERENTIAL/PLATELET
BASOS PCT: 0.9 % (ref 0.0–3.0)
Basophils Absolute: 0.1 10*3/uL (ref 0.0–0.1)
Eosinophils Absolute: 0.3 10*3/uL (ref 0.0–0.7)
Eosinophils Relative: 5.1 % — ABNORMAL HIGH (ref 0.0–5.0)
HCT: 41 % (ref 39.0–52.0)
Hemoglobin: 14.1 g/dL (ref 13.0–17.0)
Lymphocytes Relative: 23.6 % (ref 12.0–46.0)
Lymphs Abs: 1.5 10*3/uL (ref 0.7–4.0)
MCHC: 34.3 g/dL (ref 30.0–36.0)
MCV: 84.8 fl (ref 78.0–100.0)
MONOS PCT: 9.8 % (ref 3.0–12.0)
Monocytes Absolute: 0.6 10*3/uL (ref 0.1–1.0)
Neutro Abs: 3.7 10*3/uL (ref 1.4–7.7)
Neutrophils Relative %: 60.6 % (ref 43.0–77.0)
Platelets: 338 10*3/uL (ref 150.0–400.0)
RBC: 4.84 Mil/uL (ref 4.22–5.81)
RDW: 13.9 % (ref 11.5–15.5)
WBC: 6.2 10*3/uL (ref 4.0–10.5)

## 2018-08-03 LAB — COMPREHENSIVE METABOLIC PANEL
ALT: 33 U/L (ref 0–53)
AST: 25 U/L (ref 0–37)
Albumin: 4.4 g/dL (ref 3.5–5.2)
Alkaline Phosphatase: 73 U/L (ref 39–117)
BUN: 14 mg/dL (ref 6–23)
CO2: 29 mEq/L (ref 19–32)
Calcium: 9.4 mg/dL (ref 8.4–10.5)
Chloride: 100 mEq/L (ref 96–112)
Creatinine, Ser: 0.76 mg/dL (ref 0.40–1.50)
GFR: 108.68 mL/min (ref >60.00)
Glucose, Bld: 121 mg/dL — ABNORMAL HIGH (ref 70–99)
Potassium: 4.3 mEq/L (ref 3.5–5.1)
Sodium: 137 mEq/L (ref 135–145)
Total Bilirubin: 0.5 mg/dL (ref 0.2–1.2)
Total Protein: 7.1 g/dL (ref 6.0–8.3)

## 2018-08-03 LAB — POC URINALSYSI DIPSTICK (AUTOMATED)
Bilirubin, UA: NEGATIVE
Glucose, UA: NEGATIVE
KETONES UA: NEGATIVE
Leukocytes, UA: NEGATIVE
Nitrite, UA: NEGATIVE
Protein, UA: NEGATIVE
RBC UA: NEGATIVE
Spec Grav, UA: 1.015 (ref 1.010–1.025)
Urobilinogen, UA: NEGATIVE E.U./dL — AB
pH, UA: 6.5 (ref 5.0–8.0)

## 2018-08-03 LAB — LIPID PANEL
CHOL/HDL RATIO: 5
Cholesterol: 186 mg/dL (ref 0–200)
HDL: 39.8 mg/dL (ref >39.00)
NonHDL: 146.51
Triglycerides: 215 mg/dL — ABNORMAL HIGH (ref 0.0–149.0)
VLDL: 43 mg/dL — ABNORMAL HIGH (ref 0.0–40.0)

## 2018-08-03 LAB — LDL CHOLESTEROL, DIRECT: Direct LDL: 128 mg/dL

## 2018-08-03 LAB — PSA: PSA: 1.73 ng/mL (ref 0.10–4.00)

## 2018-08-03 NOTE — Progress Notes (Signed)
Subjective:    Patient ID: Jackson Hatfield, male    DOB: [birth year], 48 y.o.   MRN: [id number]  HPI  Pt in for cpe.  Pt is fasting.  Pt does bring up diabetes concern. Pt had sugar was 145 at dot exam. See ros. He is walking daily for about 30 minutes. Has changes his diet as well. Cut out sugars. He wants to be referred to dietician.  Pt states he is getting up at night have to use bathroom 2-3 times at night. Hx of bph and was on flomax in past.  Restless sleep. Not sleeping well. He has not seen pulmonolgist recently per pt. He questions what he can use. Tried melatonin and states did not work.  Pt up to date on vaccines.     Review of Systems  Constitutional: Negative for chills, fatigue and fever.  Respiratory: Negative for cough, chest tightness, shortness of breath and wheezing.   Cardiovascular: Negative for chest pain and palpitations.  Gastrointestinal: Negative for abdominal pain.  Genitourinary: Negative for decreased urine volume, dysuria, hematuria and testicular pain.  Musculoskeletal: Negative for back pain.  Skin: Negative for pallor and rash.  Neurological: Negative for dizziness, facial asymmetry, speech difficulty, weakness and numbness.  Hematological: Negative for adenopathy. Does not bruise/bleed easily.  Psychiatric/Behavioral: Negative for behavioral problems, decreased concentration and dysphoric mood.     Past Medical History:  Diagnosis Date  . Dyspnea    exerting self,ie: running  . Hyperlipidemia   . Hypertension   . Pneumonia   . Pre-diabetes   . Sarcoidosis   . Sleep apnea      Social History   Socioeconomic History  . Marital status: Married    Spouse name: Not on file  . Number of children: Not on file  . Years of education: Not on file  . Highest education level: Not on file  Occupational History  . Not on file  Social Needs  . Financial resource strain: Not on file  . Food insecurity:    Worry: Not on file    Inability:  Not on file  . Transportation needs:    Medical: Not on file    Non-medical: Not on file  Tobacco Use  . Smoking status: Never Smoker  . Smokeless tobacco: Never Used  Substance and Sexual Activity  . Alcohol use: Yes    Comment: ocassionally  . Drug use: No  . Sexual activity: Not on file  Lifestyle  . Physical activity:    Days per week: Not on file    Minutes per session: Not on file  . Stress: Not on file  Relationships  . Social connections:    Talks on phone: Not on file    Gets together: Not on file    Attends religious service: Not on file    Active member of club or organization: Not on file    Attends meetings of clubs or organizations: Not on file    Relationship status: Not on file  . Intimate partner violence:    Fear of current or ex partner: Not on file    Emotionally abused: Not on file    Physically abused: Not on file    Forced sexual activity: Not on file  Other Topics Concern  . Not on file  Social History Narrative  . Not on file    Past Surgical History:  Procedure Laterality Date  . KNEE ARTHROSCOPY WITH MEDIAL MENISECTOMY Right 06/18/2018   Procedure: Right knee arthroscopy,  partial medial menisectomy, debridement and drainage of cyst;  Surgeon: [name], MD;  Location: [facility];  Service: Orthopedics;  Laterality: Right;  60 mins  . LOBECTOMY Left 2002   lower left  ( cyst on the lobe)in [facility] in [place], [place]    Family History  Problem Relation Age of Onset  . COPD Mother   . Alcohol abuse Father   . Arthritis Father   . Diabetes Father   . Heart disease Father   . Hyperlipidemia Father   . Kidney disease Father   . Diabetes Brother   . Stroke Brother   . Alcohol abuse Brother   . Diabetes Brother   . Drug abuse Brother     Allergies  Allergen Reactions  . Naproxen Hives  . Penicillins Hives    Current Outpatient Medications on File Prior to Visit  Medication Sig Dispense Refill  . albuterol (ACCUNEB)  0.63 MG/3ML nebulizer solution Take 3 mLs (0.63 mg total) by nebulization every 6 (six) hours as needed for wheezing. 75 mL 5  . albuterol (PROVENTIL HFA;VENTOLIN HFA) 108 (90 Base) MCG/ACT inhaler Inhale 2 puffs into the lungs every 4 (four) hours as needed for wheezing or shortness of breath. 1 Inhaler 2  . aspirin EC 325 MG tablet Take 1 tablet (325 mg total) by mouth daily. 30 tablet 0  . losartan (COZAAR) 100 MG tablet Take 1 tablet (100 mg total) by mouth daily. 90 tablet 3  . lovastatin (MEVACOR) 10 MG tablet Take 1 tablet (10 mg total) by mouth at bedtime. 90 tablet 3  . metFORMIN (GLUCOPHAGE-XR) 500 MG 24 hr tablet Take 1 tablet (500 mg total) by mouth daily with breakfast. 30 tablet 3  . Multiple Vitamins-Minerals (MENS ONE DAILY PO) Take 1 tablet by mouth daily.     Marland Kitchen oxyCODONE-acetaminophen (PERCOCET) 5-325 MG tablet Take 1 tablet by mouth every 4 (four) hours as needed for severe pain. 20 tablet 0  . sildenafil (REVATIO) 20 MG tablet 2-5 tab po prior to sex 50 tablet 0  . tamsulosin (FLOMAX) 0.4 MG CAPS capsule Take 1 capsule (0.4 mg total) by mouth daily. 90 capsule 3   No current facility-administered medications on file prior to visit.     BP 130/69   Pulse 72   Temp 98.2 F (36.8 C) (Oral)   Resp 16   Ht 6\' 4"  (1.93 m)   Wt (!) 313 lb 6.4 oz (142.2 kg)   SpO2 96%   BMI 38.15 kg/m       Objective:   Physical Exam  General Mental Status- Alert. General Appearance- Not in acute distress.   Skin General: Color- Normal Color. Moisture- Normal Moisture.  Heent- normal but large tonsils  Neck Carotid Arteries- Normal color. Moisture- Normal Moisture. No carotid bruits. No JVD.  Chest and Lung Exam Auscultation: Breath Sounds:-Normal.  Cardiovascular Auscultation:Rythm- Regular. Murmurs & Other Heart Sounds:Auscultation of the heart reveals- No Murmurs.  Abdomen Inspection:-Inspeection Normal. Palpation/Percussion:Note:No mass. Palpation and Percussion of  the abdomen reveal- Non Tender, Non Distended + BS, no rebound or guarding.    Neurologic Cranial Nerve exam:- CN III-XII intact(No nystagmus), symmetric smile. Drift Test:- No drift. Romberg Exam:- Negative.  Heal to Toe Gait exam:-Normal. Finger to Nose:- Normal/Intact Strength:- 5/5 equal and symmetric strength both upper and lower extremities.  Derm- moderate sized large and moderate numerous moles on back.       Assessment & Plan:  For you wellness exam today I have ordered  cbc, cmp, tsh, lipid panel, ua and hiv.  Vaccine up to date  Recommend exercise and healthy diet.  We will let you know lab results as they come in.  Follow up date appointment will be determined after lab review.   For your diabetes, continue with metformin.  I did place referral to see dietitian for diabetic education.  Also please call your insurance and ask if they can give you a free glucometer or if they have a preferred brand glucometer.  Let me know if they give you preferred brand and we could send prescription to your pharmacy along with supplies and instruction on frequency of sugar checking.  For frequent urination, did place referral to urologist.  We will get PSA today. For variant sizes of moles/skin lesion on exam. Did refer to dermatologist.  For history of sleep apnea and difficulty sleeping, I did refer you back to your pulmonologist.  In addition to the wellness exam, I did discuss with him his concerns regarding recent elevated blood sugar.  I explained to him that A1c goal for diabetics.  And explained what is most recent blood sugar averages were.  Explained to him option that we could increase his metformin to 2 tablets once a day if your A1c comes back high.  In addition I discussed with patient is frequent urination, history of probable BPH and decide to go ahead and place referral to urologist.  We had a discussion also regarding sleep apnea and he has had difficulty sleeping.   Explained would prefer with a history of sleep apnea that pulmonologist give advice on potential medication to help him sleep.  In total this conversations took 10 to 15 minutes.  I will charge wellness and 10-minute on [phone].

## 2018-08-03 NOTE — Patient Instructions (Addendum)
For you wellness exam today I have ordered cbc, cmp, psa, lipid panel, ua and hiv.  Vaccine up to date.  Recommend exercise and healthy diet.  We will let you know lab results as they come in.  Follow up date appointment will be determined after lab review.     For your diabetes, continue with metformin.  I did place referral to see dietitian for diabetic education.  Also please call your insurance and ask if they can give you a free glucometer or if they have a preferred brand glucometer.  Let me know if they give you preferred brand and we could send prescription to your pharmacy along with supplies and instruction on frequency of sugar checking.  For frequent urination, did place referral to urologist.  We will get PSA today. For variant sizes of moles/skin lesion on exam. Did refer to dermatologist.  For history of sleep apnea and difficulty sleeping, I did refer you back to your pulmonologist.     Preventive Care 40-64 Years, Male Preventive care refers to lifestyle choices and visits with your health care provider that can promote health and wellness. What does preventive care include?   A yearly physical exam. This is also called an annual well check.  Dental exams once or twice a year.  Routine eye exams. Ask your health care provider how often you should have your eyes checked.  Personal lifestyle choices, including: ? Daily care of your teeth and gums. ? Regular physical activity. ? Eating a healthy diet. ? Avoiding tobacco and drug use. ? Limiting alcohol use. ? Practicing safe sex. ? Taking low-dose aspirin every day starting at age 44. What happens during an annual well check? The services and screenings done by your health care provider during your annual well check will depend on your age, overall health, lifestyle risk factors, and family history of disease. Counseling Your health care provider may ask you questions about your:  Alcohol use.  Tobacco  use.  Drug use.  Emotional well-being.  Home and relationship well-being.  Sexual activity.  Eating habits.  Work and work Statistician. Screening You may have the following tests or measurements:  Height, weight, and BMI.  Blood pressure.  Lipid and cholesterol levels. These may be checked every 5 years, or more frequently if you are over 29 years old.  Skin check.  Lung cancer screening. You may have this screening every year starting at age 30 if you have a 30-pack-year history of smoking and currently smoke or have quit within the past 15 years.  Colorectal cancer screening. All adults should have this screening starting at age 3 and continuing until age 77. Your health care provider may recommend screening at age 16. You will have tests every 1-10 years, depending on your results and the type of screening test. People at increased risk should start screening at an earlier age. Screening tests may include: ? Guaiac-based fecal occult blood testing. ? Fecal immunochemical test (FIT). ? Stool DNA test. ? Virtual colonoscopy. ? Sigmoidoscopy. During this test, a flexible tube with a tiny camera (sigmoidoscope) is used to examine your rectum and lower colon. The sigmoidoscope is inserted through your anus into your rectum and lower colon. ? Colonoscopy. During this test, a long, thin, flexible tube with a tiny camera (colonoscope) is used to examine your entire colon and rectum.  Prostate cancer screening. Recommendations will vary depending on your family history and other risks.  Hepatitis C blood test.  Hepatitis B blood  test.  Sexually transmitted disease (STD) testing.  Diabetes screening. This is done by checking your blood sugar (glucose) after you have not eaten for a while (fasting). You may have this done every 1-3 years. Discuss your test results, treatment options, and if necessary, the need for more tests with your health care provider. Vaccines Your health  care provider may recommend certain vaccines, such as:  Influenza vaccine. This is recommended every year.  Tetanus, diphtheria, and acellular pertussis (Tdap, Td) vaccine. You may need a Td booster every 10 years.  Varicella vaccine. You may need this if you have not been vaccinated.  Zoster vaccine. You may need this after age 24.  Measles, mumps, and rubella (MMR) vaccine. You may need at least one dose of MMR if you were born in 1957 or later. You may also need a second dose.  Pneumococcal 13-valent conjugate (PCV13) vaccine. You may need this if you have certain conditions and have not been vaccinated.  Pneumococcal polysaccharide (PPSV23) vaccine. You may need one or two doses if you smoke cigarettes or if you have certain conditions.  Meningococcal vaccine. You may need this if you have certain conditions.  Hepatitis A vaccine. You may need this if you have certain conditions or if you travel or work in places where you may be exposed to hepatitis A.  Hepatitis B vaccine. You may need this if you have certain conditions or if you travel or work in places where you may be exposed to hepatitis B.  Haemophilus influenzae type b (Hib) vaccine. You may need this if you have certain risk factors. Talk to your health care provider about which screenings and vaccines you need and how often you need them. This information is not intended to replace advice given to you by your health care provider. Make sure you discuss any questions you have with your health care provider. Document Released: 07/06/2015 Document Revised: 07/30/2017 Document Reviewed: 04/10/2015 Elsevier Interactive Patient Education  2019 [employer].

## 2018-08-04 ENCOUNTER — Telehealth: Payer: Self-pay | Admitting: Medical

## 2018-08-04 DIAGNOSIS — E119 Type 2 diabetes mellitus without complications: Secondary | ICD-10-CM

## 2018-08-04 LAB — HIV ANTIBODY (ROUTINE TESTING W REFLEX): HIV 1&2 Ab, 4th Generation: NONREACTIVE

## 2018-08-04 NOTE — Telephone Encounter (Signed)
Future lab order placed.  

## 2018-08-04 NOTE — Telephone Encounter (Signed)
Please reslult pt flu test it was negative.

## 2018-08-04 NOTE — Telephone Encounter (Signed)
Opened to review 

## 2018-08-07 ENCOUNTER — Other Ambulatory Visit: Payer: Self-pay | Admitting: Medical

## 2018-08-13 DIAGNOSIS — L821 Other seborrheic keratosis: Secondary | ICD-10-CM | POA: Diagnosis not present

## 2018-08-13 DIAGNOSIS — D485 Neoplasm of uncertain behavior of skin: Secondary | ICD-10-CM | POA: Diagnosis not present

## 2018-08-17 ENCOUNTER — Telehealth: Payer: Self-pay | Admitting: Medical

## 2018-08-17 MED ORDER — BLOOD GLUCOSE METER KIT: PACK | 0 refills | Status: DC

## 2018-08-17 NOTE — Telephone Encounter (Signed)
Copied from [place] [id number]. Topic: General - Other >> Aug 17, 2018 11:24 AM Lennox Solders wrote: Reason for CRM: pt is calling and was dx with dm and would like new rx one touch meter with testing strips and lancets. Pt said he will check his BS twice a day. [facility] [address]. Pt saw edward on 08-03-2018

## 2018-08-17 NOTE — Telephone Encounter (Signed)
rx sent to pharmacy

## 2018-08-18 ENCOUNTER — Ambulatory Visit: Payer: 59 | Admitting: Adult Health

## 2018-09-09 ENCOUNTER — Other Ambulatory Visit: Payer: Self-pay | Admitting: *Deleted

## 2018-09-09 DIAGNOSIS — D869 Sarcoidosis, unspecified: Secondary | ICD-10-CM

## 2018-09-17 DIAGNOSIS — G4733 Obstructive sleep apnea (adult) (pediatric): Secondary | ICD-10-CM | POA: Diagnosis not present

## 2018-09-21 ENCOUNTER — Ambulatory Visit: Payer: 59 | Admitting: Adult Health

## 2018-09-27 ENCOUNTER — Other Ambulatory Visit: Payer: Self-pay

## 2018-09-27 ENCOUNTER — Encounter: Payer: Self-pay | Admitting: Family Medicine

## 2018-09-27 ENCOUNTER — Ambulatory Visit (INDEPENDENT_AMBULATORY_CARE_PROVIDER_SITE_OTHER): Payer: 59 | Admitting: Family Medicine

## 2018-09-27 DIAGNOSIS — N529 Male erectile dysfunction, unspecified: Secondary | ICD-10-CM | POA: Diagnosis not present

## 2018-09-27 DIAGNOSIS — R454 Irritability and anger: Secondary | ICD-10-CM | POA: Diagnosis not present

## 2018-09-27 MED ORDER — SILDENAFIL CITRATE 20 MG PO TABS: ORAL_TABLET | ORAL | 2 refills | Status: DC

## 2018-09-27 MED ORDER — FLUOXETINE HCL 20 MG PO TABS: 20.0000 mg | ORAL_TABLET | Freq: Every day | ORAL | 6 refills | Status: DC

## 2018-09-27 NOTE — Patient Instructions (Signed)
It was nice to talk with you today I hope that the fluoxetine will be helpful for irritability.  Start with 20 mg daily, we can increase your dose in 2 or 3 weeks if need be. Please send me a message in about 2 weeks and let me know how this medication is working for you  I also refilled your generic sildenafil.  If you decide you would like to try a different medication, or if an alternative is covered by insurance, let me know and I can write a prescription

## 2018-09-27 NOTE — Progress Notes (Signed)
[place] [place] [facility] [address] [place], [place] [address] [id number] [phone]  Date:  09/27/2018   Name:  Jackson Hatfield   DOB:  04-01-1971   MRN:  [id number]  PCP:  Jackson Pai, PA-C    Chief Complaint: No chief complaint on file.   History of Present Illness:  Jackson Hatfield is a 48 y.o. very pleasant male patient who presents with the following: Pt ID confirmed Patient of my partner Jackson Hatfield, who called in today with concern about anxiety and anger. Ever the last saw him in February for routine wellness visit.  Fabrice does have diabetes, for which he takes metformin  Lab Results  Component Value Date   HGBA1C 6.4 (H) 06/09/2018   His last A1c showed good control of his diabetes  Today he notes that he has been really "edgy and snappy," he is working a lot and they are trying to buy a house His wife has wanted him to start on some medication Sx have gone on for 6-8 months per his wife, 2 months per pt  Never treated in the past  He is feeling irritable He does not sleep that well even with cpap He takes and OTC He does not feel down or depressed  He notes that his father was an "angry guy," his son [place] supposed to be taking medication for ADD Not a smoker, does not use tobacco  He drinks beer on the weekend only   He reports that he is no longer taking the cholesterol medication  He is also using generic sildenafil for ED and notes that it seems to work, he only has ED some of the time however and does not always need the medication.  He has only tried taking 20 mg, did not realize that he could take more. He is interested in possibly getting a higher dose brand Viagra, will look into his insurance coverage for this medication   Patient Active Problem List   Diagnosis Date Noted  . Morbid obesity ([place]) 10/05/2017  . Sarcoidosis 08/06/2017  . OSA (obstructive sleep apnea) 08/06/2017    Past Medical History:   Diagnosis Date  . Dyspnea    exerting self,ie: running  . Hyperlipidemia   . Hypertension   . Pneumonia   . Pre-diabetes   . Sarcoidosis   . Sleep apnea     Past Surgical History:  Procedure Laterality Date  . KNEE ARTHROSCOPY WITH MEDIAL MENISECTOMY Right 06/18/2018   Procedure: Right knee arthroscopy, partial medial menisectomy, debridement and drainage of cyst;  Surgeon: [name], MD;  Location: [place];  Service: Orthopedics;  Laterality: Right;  60 mins  . LOBECTOMY Left 2002   lower left  ( cyst on the lobe)in [facility] in [place], [place]    Social History   Tobacco Use  . Smoking status: Never Smoker  . Smokeless tobacco: Never Used  Substance Use Topics  . Alcohol use: Yes    Comment: ocassionally  . Drug use: No    Family History  Problem Relation Age of Onset  . COPD Mother   . Alcohol abuse Father   . Arthritis Father   . Diabetes Father   . Heart disease Father   . Hyperlipidemia Father   . Kidney disease Father   . Diabetes Brother   . Stroke Brother   . Alcohol abuse Brother   . Diabetes Brother   . Drug abuse Brother  Allergies  Allergen Reactions  . Naproxen Hives  . Penicillins Hives    Medication list has been reviewed and updated.  Current Outpatient Medications on File Prior to Visit  Medication Sig Dispense Refill  . albuterol (ACCUNEB) 0.63 MG/3ML nebulizer solution Take 3 mLs (0.63 mg total) by nebulization every 6 (six) hours as needed for wheezing. 75 mL 5  . albuterol (PROVENTIL HFA;VENTOLIN HFA) 108 (90 Base) MCG/ACT inhaler Inhale 2 puffs into the lungs every 4 (four) hours as needed for wheezing or shortness of breath. 1 Inhaler 2  . aspirin EC 325 MG tablet Take 1 tablet (325 mg total) by mouth daily. 30 tablet 0  . blood glucose meter kit and supplies Check blood sugar 2 times daily (E11.9). 1 each 0  . losartan (COZAAR) 100 MG tablet Take 1 tablet (100 mg total) by mouth daily. 90 tablet 3  . lovastatin  (MEVACOR) 10 MG tablet Take 1 tablet (10 mg total) by mouth at bedtime. 90 tablet 3  . metFORMIN (GLUCOPHAGE-XR) 500 MG 24 hr tablet TAKE 1 TABLET BY MOUTH EVERY DAY WITH BREAKFAST 30 tablet 3  . Multiple Vitamins-Minerals (MENS ONE DAILY PO) Take 1 tablet by mouth daily.     Marland Kitchen oxyCODONE-acetaminophen (PERCOCET) 5-325 MG tablet Take 1 tablet by mouth every 4 (four) hours as needed for severe pain. 20 tablet 0  . sildenafil (REVATIO) 20 MG tablet 2-5 tab po prior to sex 50 tablet 0  . tamsulosin (FLOMAX) 0.4 MG CAPS capsule Take 1 capsule (0.4 mg total) by mouth daily. 90 capsule 3   No current facility-administered medications on file prior to visit.     Review of Systems:  As per HPI- otherwise negative.   Physical Examination: There were no vitals filed for this visit. There were no vitals filed for this visit. There is no height or weight on file to calculate BMI. Ideal Body Weight:    Spoke with patient over the phone.  He sounds well, no distress, coughing, wheezing, or tachypnea noted  Assessment and Plan: Irritability - Plan: FLUoxetine (PROZAC) 20 MG tablet  Erectile dysfunction, unspecified erectile dysfunction type - Plan: sildenafil (REVATIO) 20 MG tablet  Phone visit today for 2 concerns.  The patient and also his wife had noticed irritability for several months.  He denies any suicidal ideation, does not really feel depressed.  We will have him start on fluoxetine 20 mg, went over most common side effects and what to expect with use this medication. We can go up on his dose if effect is not satisfactory in a few weeks.  Also discussed his erectile dysfunction.  I have refilled his sildanafil and explained that he can take up to 100 mg at a time if needed.  We are hoping that treating his mood symptoms may actually help with his erections, because he has normal erections some of the time I suspect his ED may be more psychological in nature Spoke to pt for just over 15  minutes today   MyChart message sent to patient as a visit summary Signed Lamar Blinks, MD

## 2018-12-13 ENCOUNTER — Other Ambulatory Visit: Payer: Self-pay | Admitting: Medical

## 2018-12-19 NOTE — Patient Instructions (Addendum)
For recent nausea, I am prescribing zofran prn nausea or vomiting. If associated abdomen/epigastric pain then can use famotidine. Will get cbc, cmp, amylase, lipase and h pylori breath test. If severe constant nasuea/vomiting or occurring constant abd pain will get abdomen US.  Considered possible metformin side effect but you have been been on metformin in past before. You could briefly change time of day/meal when you use metformin to see if direct relation. But again I doubt metformin cause as have been on med for some time.  For diabetes, will add a1c to your labs as well.  For hx of irritability will continue prozac if you feel still beneficial.  For hx of ED can continue viagra as needed for ED.  Also referral for screening colonoscopy placed.  Advised will get staff to schedule for fasting future labs later this week. Include lipid panel since some high triglycerides in past.  Follow up date to be determined after lab review.

## 2018-12-19 NOTE — Progress Notes (Signed)
Subjective:    Patient ID: Jackson Hatfield, male    DOB: 05/10/1971, 48 y.o.   MRN: [id number]  HPI   Virtual Visit via Video Note  I connected with Jackson Hatfield on 12/19/18 at  3:20 PM EDT by a video enabled telemedicine application and verified that I am speaking with the correct person using two identifiers.  Location: Patient: home Provider: work   I discussed the limitations of evaluation and management by telemedicine and the availability of in person appointments. The patient expressed understanding and agreed to proceed.  History of Present Illness: Pt in today recent nausea after eating breakfast and taking medications. Sometimes he reports will vomit as well. This is new complaint. On review he drinks alcohol only on weekends. He has been on metformin for some time in the past but no prior gi type complaints with metofrmin. Only takes once a day.  Pt does have diabetes. Last a1c was 6.4 in December 2019.  Last visit had some irritability around time trying to by a house. Dr. Patsy Lager came him prozac with refills.   He also has hx of occasional ED for which he takes viagra.     Observations/Objective: General-no acute distress, pleasant, oriented. Lungs- on inspection lungs appear unlabored. Neck- no tracheal deviation or jvd on inspection. Neuro- gross motor function appears intact. Abdomen- pt self palpation reveals   Assessment and Plan: For recent nausea, I am prescribing zofran prn nausea or vomiting. If associated abdomen/epigastric pain then can use famotidine. Will get cbc, cmp, amylase, lipase and h pylori breath test. If severe constant nasuea/vomiting or occurring constant abd pain will get abdomen US.  Considered possible metformin side effect but you have been been on metformin in past before. You could briefly change time of day/meal when you use metformin to see if direct relation. But again I doubt metformin cause as have been on med for some  time.  For diabetes, will add a1c to your labs as well.  For hx of irritability will continue prozac if you feel still beneficial.  For hx of ED can continue viagra as needed for ED.  Also referral for screening colonoscopy placed.  Advised will get staff to schedule for fasting future labs later this week. Include lipid panel since some high triglycerides in past.  Follow up date to be determined after lab review.  Esperanza Richters, PA-C    Follow Up Instructions:    I discussed the assessment and treatment plan with the patient. The patient was provided an opportunity to ask questions and all were answered. The patient agreed with the plan and demonstrated an understanding of the instructions.   The patient was advised to call back or seek an in-person evaluation if the symptoms worsen or if the condition fails to improve as anticipated.     Esperanza Richters, PA-C    Review of Systems  Constitutional: Negative for chills, fatigue and unexpected weight change.  Eyes: Negative for pain.  Respiratory: Negative for chest tightness, shortness of breath and wheezing.   Cardiovascular: Negative for chest pain and palpitations.  Gastrointestinal: Positive for nausea and vomiting. Negative for abdominal distention, blood in stool, constipation, diarrhea and rectal pain.       See hpi.  Endocrine: Negative for polydipsia, polyphagia and polyuria.  Genitourinary:       ED.  Musculoskeletal: Negative for back pain.  Skin: Negative for rash.  Neurological: Negative for dizziness, syncope, weakness and numbness.  Psychiatric/Behavioral: Negative for behavioral problems,  decreased concentration, sleep disturbance and suicidal ideas. The patient is not nervous/anxious.        Hx of irritability.       Objective:   Physical Exam        Assessment & Plan:

## 2018-12-20 ENCOUNTER — Other Ambulatory Visit: Payer: Self-pay

## 2018-12-20 ENCOUNTER — Ambulatory Visit: Payer: 59 | Admitting: Medical

## 2018-12-23 ENCOUNTER — Encounter: Payer: Self-pay | Admitting: Medical

## 2018-12-23 ENCOUNTER — Other Ambulatory Visit: Payer: Self-pay

## 2018-12-23 ENCOUNTER — Ambulatory Visit (INDEPENDENT_AMBULATORY_CARE_PROVIDER_SITE_OTHER): Payer: 59 | Admitting: Medical

## 2018-12-23 DIAGNOSIS — R110 Nausea: Secondary | ICD-10-CM

## 2018-12-23 DIAGNOSIS — F329 Major depressive disorder, single episode, unspecified: Secondary | ICD-10-CM | POA: Diagnosis not present

## 2018-12-23 DIAGNOSIS — N529 Male erectile dysfunction, unspecified: Secondary | ICD-10-CM | POA: Diagnosis not present

## 2018-12-23 DIAGNOSIS — F32A Depression, unspecified: Secondary | ICD-10-CM

## 2018-12-23 DIAGNOSIS — Z1211 Encounter for screening for malignant neoplasm of colon: Secondary | ICD-10-CM | POA: Diagnosis not present

## 2018-12-23 MED ORDER — FAMOTIDINE 20 MG PO TABS: 20.0000 mg | ORAL_TABLET | Freq: Every day | ORAL | 0 refills | Status: DC

## 2018-12-23 MED ORDER — METFORMIN HCL 500 MG PO TABS: ORAL_TABLET | ORAL | 3 refills | Status: DC

## 2018-12-23 MED ORDER — BUPROPION HCL ER (XL) 150 MG PO TB24: 150.0000 mg | ORAL_TABLET | Freq: Every day | ORAL | 0 refills | Status: DC

## 2018-12-23 NOTE — Patient Instructions (Addendum)
Your stomach is better just recently when you took meds apart from eating. You can continue this but do recommend taking metformin at separate time from other tabs and with food. Counseled some gi effect with metfomrin possible. Also rx regular metoformin and stopping metformin ER which is under recall.   After review of last note decided also to go ahead and rx famotadine.  For depression, I decided to stop prozac/taper off quick and then start wellbutrin on Monday. Since hx of ED want to avoid SSRI potential erection side effect.  If ED persist can refer to urologist.  Did counsel patient that going thru divorce and starting new relationship problably causing stress and contributing  to ED.  Follow 2 weeks telephone or virtual visit.

## 2018-12-23 NOTE — Progress Notes (Signed)
Subjective:    Patient ID: Jackson Hatfield, male    DOB: September 01, 1970, 48 y.o.   MRN: [id number]  HPI  Virtual Visit via Telephone Note  I connected with [name] on 12/23/18 at 11:40 AM EDT by telephone and verified that I am speaking with the correct person using two identifiers.  Location: Patient: pt using head set driving so can't do video visit.  Provider: home   I discussed the limitations, risks, security and privacy concerns of performing an evaluation and management service by telephone and the availability of in person appointments. I also discussed with the patient that there may be a patient responsible charge related to this service. The patient expressed understanding and agreed to proceed.  Pt did not check his bp or pulse.    History of Present Illness:   Pt in states he has been having some stress with divorce and buying a house. I had placed him on prozac for decreased mood/depression. He reports some improvement of mood now but is still in process of going through divorce. Along with this he is experiencing some erectile dysfunction. He states he had been having ED even before the divorce while he was having marital problems. He states uses viagra but still some difficulty and he does not want to keep using stating it is very expensive. He has met someone and staring new relationship. So he speculates this is effecting erectile function.  Pt states he was taking all of his meds at one time and eating would cause him to vomit about 2 hours after taking tablets. He then states took medicine and eating apart and this stopped.   Pt is also on metformin. Pt just started this 12-13-2018. His sugars 120 most of time.    Observations/Objective: General- no acute distress, pleasant, oriented. Speech normal.   Assessment and Plan: Your stomach is better just recently when you took meds apart from eating. You can continue this but do recommend taking metformin at separate  time from other tabs and with food. Counseled some gi effect with metfomrin possible. Also rx regular metoformin and stopping metformin ER which is under recall.   After review of last note decided also to go ahead and rx famotadine.  For depression, I decided to stop prozac/taper off quick and then start wellbutrin on Monday. Since hx of ED want to avoid SSRI potential erection side effect.  If ED persist can refer to urologist.  Did counsel patient that going thru divorce and starting new relationship problably causing stress and contributing  to ED.  Follow 2 weeks telephone or virtual visit.  Mackie Pai, PA-C  Follow Up Instructions:    I discussed the assessment and treatment plan with the patient. The patient was provided an opportunity to ask questions and all were answered. The patient agreed with the plan and demonstrated an understanding of the instructions.   The patient was advised to call back or seek an in-person evaluation if the symptoms worsen or if the condition fails to improve as anticipated.  I provided 25  minutes of non-face-to-face time during this encounter.   Mackie Pai, PA-C   Review of Systems  Constitutional: Negative for chills, fatigue and fever.  Respiratory: Negative for cough, chest tightness, shortness of breath and wheezing.   Cardiovascular: Negative for chest pain and palpitations.  Gastrointestinal: Positive for nausea. Negative for abdominal pain.       See hpi.  Genitourinary: Negative for discharge, dysuria, frequency and penile pain.  ED  Musculoskeletal: Negative for back pain.  Hematological: Negative for adenopathy. Does not bruise/bleed easily.       Stress.  Psychiatric/Behavioral: Positive for dysphoric mood. Negative for behavioral problems, decreased concentration, sleep disturbance and suicidal ideas. The patient is not nervous/anxious.    Past Medical History:  Diagnosis Date   Dyspnea    exerting self,ie:  running   Hyperlipidemia    Hypertension    Pneumonia    Pre-diabetes    Sarcoidosis    Sleep apnea      Social History   Socioeconomic History   Marital status: Married    Spouse name: Not on file   Number of children: Not on file   Years of education: Not on file   Highest education level: Not on file  Occupational History   Not on file  Social Needs   Financial resource strain: Not on file   Food insecurity    Worry: Not on file    Inability: Not on file   Transportation needs    Medical: Not on file    Non-medical: Not on file  Tobacco Use   Smoking status: Never Smoker   Smokeless tobacco: Never Used  Substance and Sexual Activity   Alcohol use: Yes    Comment: ocassionally   Drug use: No   Sexual activity: Not on file  Lifestyle   Physical activity    Days per week: Not on file    Minutes per session: Not on file   Stress: Not on file  Relationships   Social connections    Talks on phone: Not on file    Gets together: Not on file    Attends religious service: Not on file    Active member of club or organization: Not on file    Attends meetings of clubs or organizations: Not on file    Relationship status: Not on file   Intimate partner violence    Fear of current or ex partner: Not on file    Emotionally abused: Not on file    Physically abused: Not on file    Forced sexual activity: Not on file  Other Topics Concern   Not on file  Social History Narrative   Not on file    Past Surgical History:  Procedure Laterality Date   KNEE ARTHROSCOPY WITH MEDIAL MENISECTOMY Right 06/18/2018   Procedure: Right knee arthroscopy, partial medial menisectomy, debridement and drainage of cyst;  Surgeon: [name], MD;  Location: [place];  Service: Orthopedics;  Laterality: Right;  60 mins   LOBECTOMY Left 2002   lower left  ( cyst on the lobe)in [facility] in [place], [place]    Family History  Problem Relation Age of  Onset   COPD Mother    Alcohol abuse Father    Arthritis Father    Diabetes Father    Heart disease Father    Hyperlipidemia Father    Kidney disease Father    Diabetes Brother    Stroke Brother    Alcohol abuse Brother    Diabetes Brother    Drug abuse Brother     Allergies  Allergen Reactions   Naproxen Hives   Penicillins Hives    Current Outpatient Medications on File Prior to Visit  Medication Sig Dispense Refill   albuterol (ACCUNEB) 0.63 MG/3ML nebulizer solution Take 3 mLs (0.63 mg total) by nebulization every 6 (six) hours as needed for wheezing. 75 mL 5   albuterol (PROVENTIL HFA;VENTOLIN HFA)  108 (90 Base) MCG/ACT inhaler Inhale 2 puffs into the lungs every 4 (four) hours as needed for wheezing or shortness of breath. 1 Inhaler 2   aspirin EC 325 MG tablet Take 1 tablet (325 mg total) by mouth daily. 30 tablet 0   blood glucose meter kit and supplies Check blood sugar 2 times daily (E11.9). 1 each 0   FLUoxetine (PROZAC) 20 MG tablet Take 1 tablet (20 mg total) by mouth daily. 30 tablet 6   losartan (COZAAR) 100 MG tablet Take 1 tablet (100 mg total) by mouth daily. 90 tablet 3   lovastatin (MEVACOR) 10 MG tablet Take 1 tablet (10 mg total) by mouth at bedtime. 90 tablet 3   Multiple Vitamins-Minerals (MENS ONE DAILY PO) Take 1 tablet by mouth daily.      oxyCODONE-acetaminophen (PERCOCET) 5-325 MG tablet Take 1 tablet by mouth every 4 (four) hours as needed for severe pain. 20 tablet 0   sildenafil (REVATIO) 20 MG tablet 2-5 tab po prior to sex 50 tablet 2   tamsulosin (FLOMAX) 0.4 MG CAPS capsule Take 1 capsule (0.4 mg total) by mouth daily. 90 capsule 3   No current facility-administered medications on file prior to visit.     There were no vitals taken for this visit.      Objective:   Physical Exam        Assessment & Plan:

## 2018-12-24 IMAGING — DX DG CHEST 2V
2 series · 3 of 3 positions shown · non-contrast
Comparison: None in PACs

CLINICAL DATA: History of sarcoidosis and left lower lobectomy. New
patient exam. No current complaints. Nonsmoker.

EXAM:
CHEST  2 VIEW

[Series 1: chest pa · 0.14mm/px · 2 of 2 slices shown]
[im 1/2]
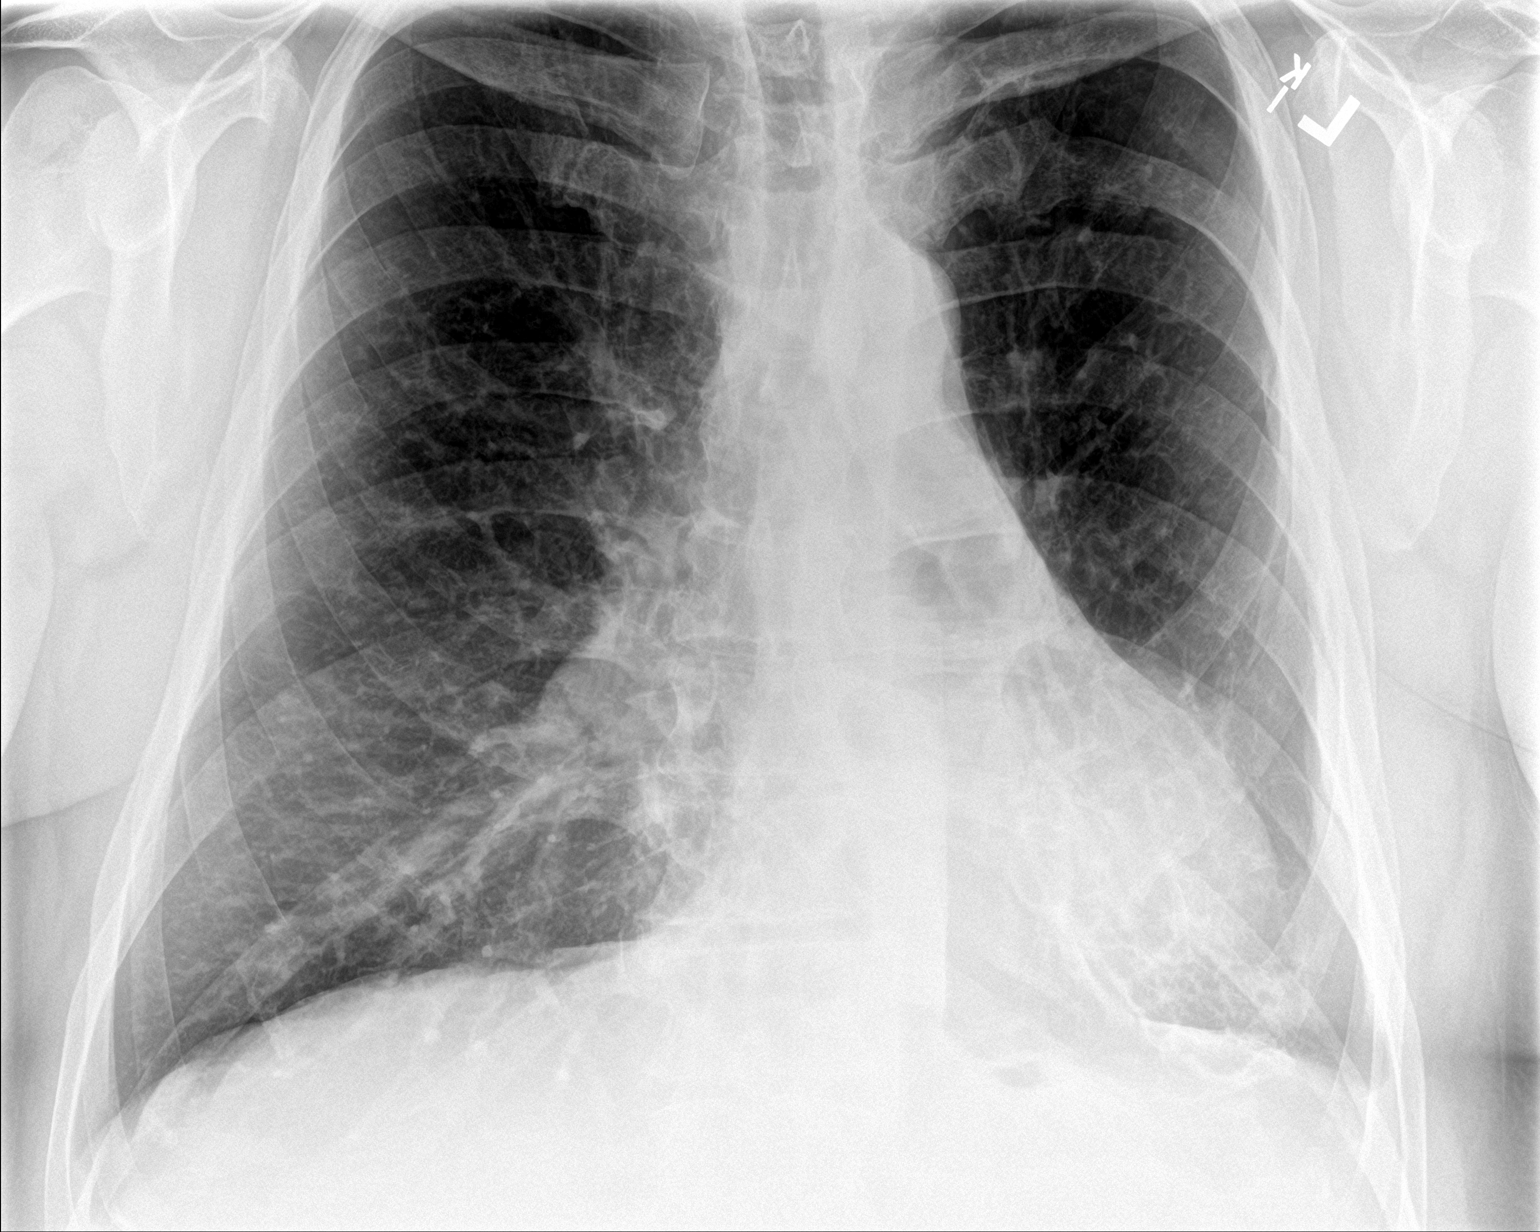
[im 2/2]
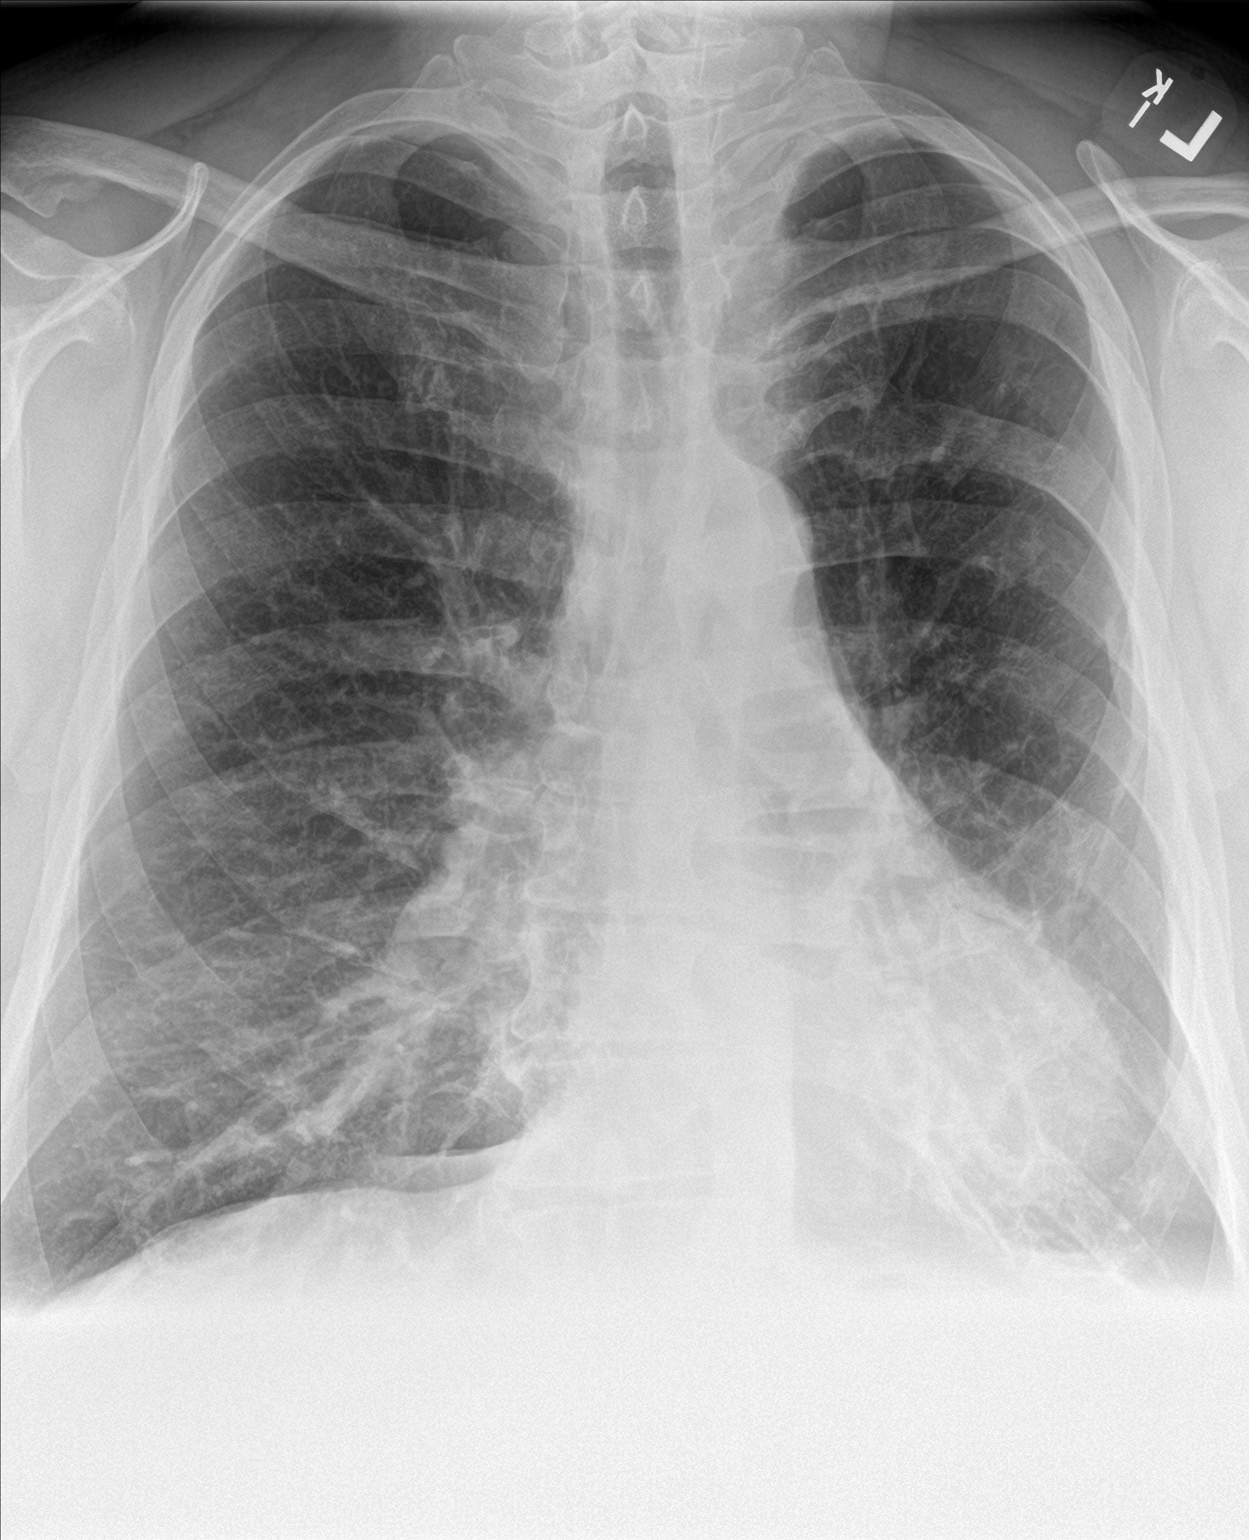

[chest lat]
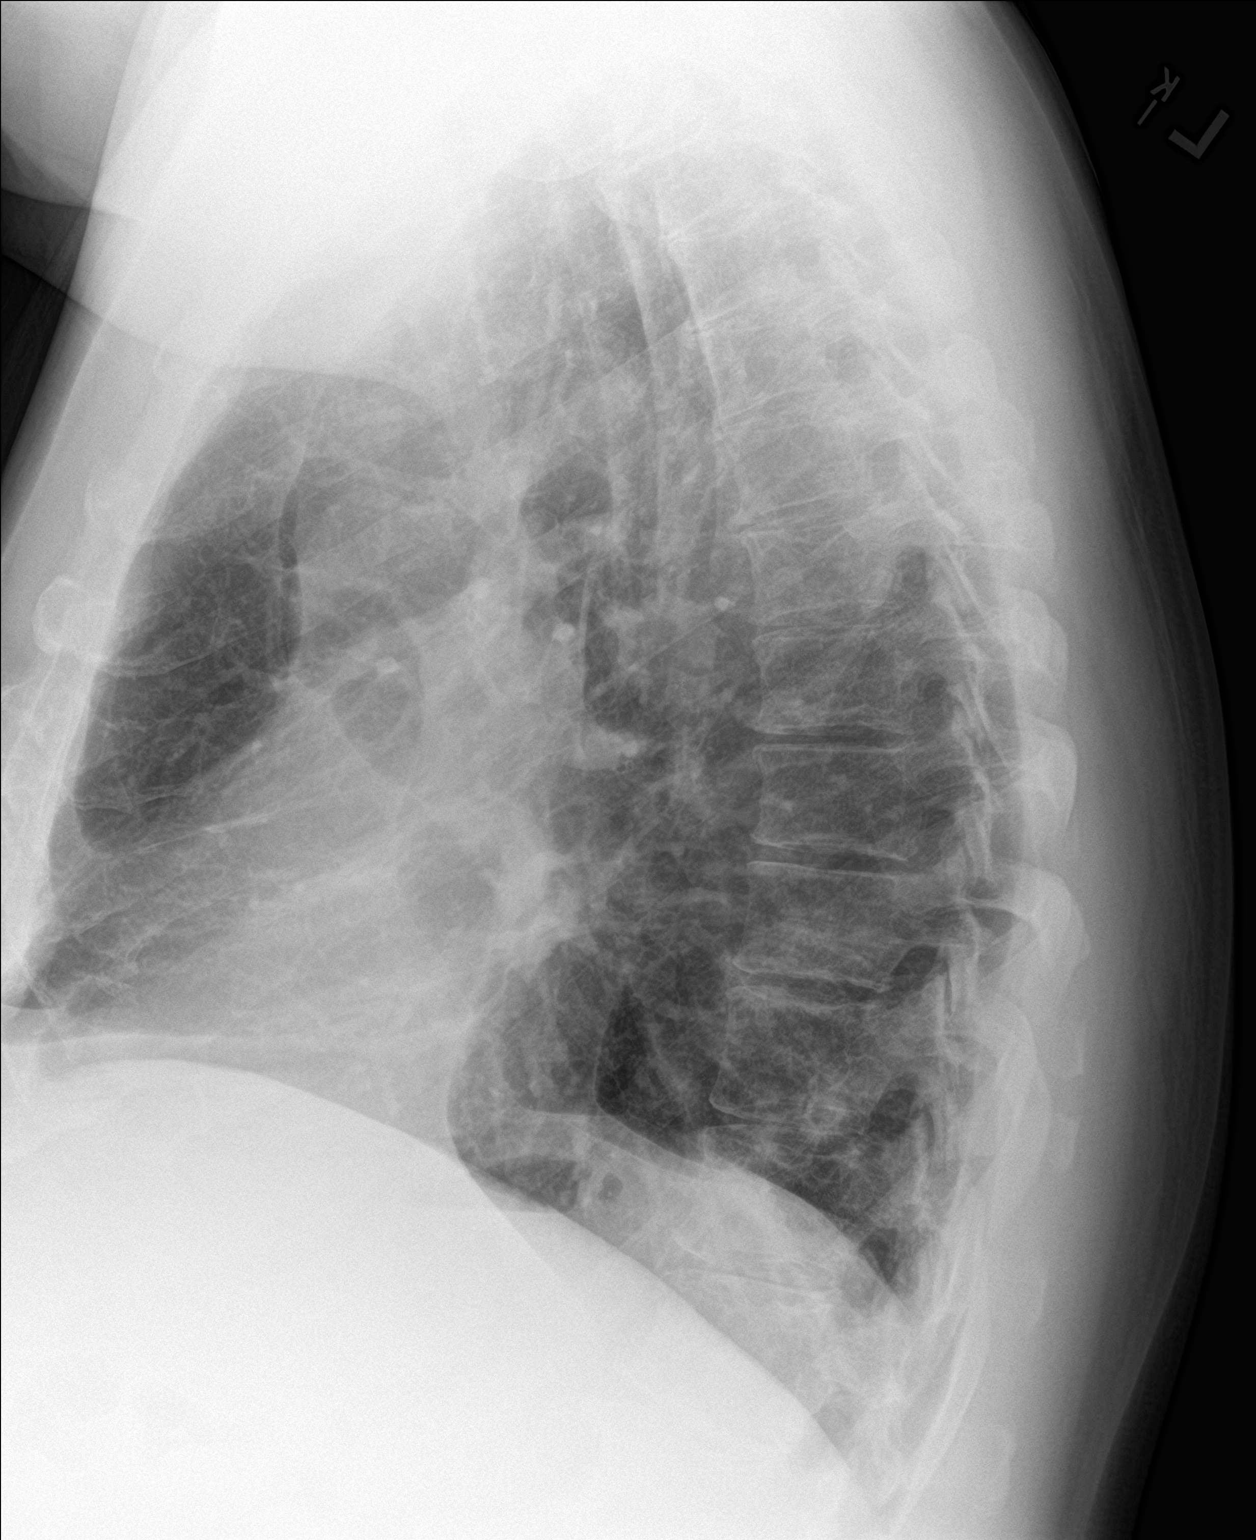

[3 of 3 positions shown; findings below may reference images not displayed]

FINDINGS: The lungs are mildly hyperinflated. The interstitial markings are
increased bilaterally greatest at both bases. There is no pleural
effusion. No mediastinal nor hilar lymphadenopathy is observed. The
heart and pulmonary vascularity are normal. The bony thorax exhibits
no acute abnormality.
IMPRESSION: Increased interstitial markings especially at both bases compatible
with the history of sarcoidosis. No lymphadenopathy.

## 2018-12-29 ENCOUNTER — Encounter: Payer: Self-pay | Admitting: Gastroenterology

## 2019-02-11 ENCOUNTER — Encounter: Payer: 59 | Admitting: Gastroenterology

## 2019-02-20 ENCOUNTER — Other Ambulatory Visit: Payer: Self-pay | Admitting: Medical

## 2019-02-21 ENCOUNTER — Telehealth: Payer: Self-pay | Admitting: Medical

## 2019-02-21 NOTE — Telephone Encounter (Signed)
Refilled pt wellbutrin. How is he doing. Recommend phone visit or virtual visit if doe not report doing well with med.

## 2019-02-21 NOTE — Telephone Encounter (Signed)
Refilled his wellbutrin. How is pt doing?

## 2019-02-22 NOTE — Telephone Encounter (Signed)
Notified pt. Pt states he is doing well.

## 2019-03-10 ENCOUNTER — Other Ambulatory Visit: Payer: Self-pay | Admitting: Medical

## 2019-03-10 DIAGNOSIS — N401 Enlarged prostate with lower urinary tract symptoms: Secondary | ICD-10-CM

## 2019-03-11 ENCOUNTER — Encounter: Payer: 59 | Admitting: Gastroenterology

## 2019-03-17 ENCOUNTER — Encounter: Payer: Self-pay | Admitting: Medical

## 2019-04-18 ENCOUNTER — Other Ambulatory Visit: Payer: Self-pay | Admitting: Medical

## 2019-05-03 ENCOUNTER — Other Ambulatory Visit: Payer: Self-pay | Admitting: Medical

## 2019-07-23 ENCOUNTER — Other Ambulatory Visit: Payer: Self-pay | Admitting: Medical

## 2019-07-25 ENCOUNTER — Telehealth: Payer: Self-pay

## 2019-07-25 MED ORDER — LOSARTAN POTASSIUM 100 MG PO TABS: 100.0000 mg | ORAL_TABLET | Freq: Every day | ORAL | 0 refills | Status: DC

## 2019-07-25 NOTE — Telephone Encounter (Signed)
Patient called in needing Dr. Harvie Heck to send over a prescription for his blood pressure medication (LORSHEAN mg5)  Patient needs this prescription sent to [facility] in [facility] zip code [phone]  Phone number to the [facility] is  [phone])  Can someone please follow up with the patient at [phone] thanks.

## 2019-07-25 NOTE — Telephone Encounter (Signed)
Patient stated he needs Losartan 100mg  sent to [facility].

## 2019-07-27 ENCOUNTER — Other Ambulatory Visit: Payer: Self-pay | Admitting: Medical

## 2019-08-12 ENCOUNTER — Telehealth: Payer: Self-pay | Admitting: Medical

## 2019-08-12 NOTE — Telephone Encounter (Signed)
No answer/vm full.    Rx was sent in on 2/3  with 1 refill .  Should be there

## 2019-08-12 NOTE — Telephone Encounter (Signed)
Spoke with pharmacy and it is there.

## 2019-08-12 NOTE — Telephone Encounter (Signed)
Medication:metFORMIN (GLUCOPHAGE) 500 MG tablet    Has the patient contacted their pharmacy? No. (If no, request that the patient contact the pharmacy for the refill.) (If yes, when and what did the pharmacy advise?)  Preferred Pharmacy (with phone number or street name):[id number] - [place], [place]  Agent: Please be advised that RX refills may take up to 3 business days. We ask that you follow-up with your pharmacy.

## 2019-08-15 NOTE — Telephone Encounter (Signed)
Spoke with patient and he did get medication.

## 2019-08-26 ENCOUNTER — Other Ambulatory Visit: Payer: Self-pay

## 2019-08-29 ENCOUNTER — Ambulatory Visit: Payer: 59 | Admitting: Medical

## 2019-09-16 ENCOUNTER — Other Ambulatory Visit: Payer: Self-pay | Admitting: Medical

## 2019-10-07 ENCOUNTER — Telehealth: Payer: Self-pay | Admitting: Medical

## 2019-10-07 MED ORDER — LOSARTAN POTASSIUM 100 MG PO TABS: 100.0000 mg | ORAL_TABLET | Freq: Every day | ORAL | 0 refills | Status: DC

## 2019-10-07 NOTE — Telephone Encounter (Signed)
Medication: losartan (COZAAR) 100 MG tablet [id number]   Has the patient contacted their pharmacy? No. (If no, request that the patient contact the pharmacy for the refill.) (If yes, when and what did the pharmacy advise?)  Preferred Pharmacy (with phone number or street name):  [id number] - [place], [place]. Phone:  [phone]  Fax:  [phone]       Agent: Please be advised that RX refills may take up to 3 business days. We ask that you follow-up with your pharmacy.

## 2019-10-07 NOTE — Telephone Encounter (Signed)
Medication sent.

## 2019-10-14 ENCOUNTER — Other Ambulatory Visit: Payer: Self-pay | Admitting: Medical

## 2019-10-24 ENCOUNTER — Ambulatory Visit
Admission: EM | Admit: 2019-10-24 | Discharge: 2019-10-24 | Disposition: A | Discharge status: Home / Self Care | Payer: PRIVATE HEALTH INSURANCE | Attending: Emergency Medicine | Admitting: Emergency Medicine

## 2019-10-24 DIAGNOSIS — R21 Rash and other nonspecific skin eruption: Secondary | ICD-10-CM

## 2019-10-24 HISTORY — DX: Type 2 diabetes mellitus without complications: E11.9

## 2019-10-24 MED ORDER — CLOTRIMAZOLE 1 % EX CREA: TOPICAL_CREAM | CUTANEOUS | 0 refills | Status: DC

## 2019-10-24 MED ORDER — MICONAZOLE NITRATE 2 % EX AERP: 1.0000 "application " | INHALATION_SPRAY | Freq: Every day | CUTANEOUS | 0 refills | Status: DC

## 2019-10-24 MED ORDER — HYDROXYZINE HCL 25 MG PO TABS: 25.0000 mg | ORAL_TABLET | Freq: Four times a day (QID) | ORAL | 0 refills | Status: DC

## 2019-10-24 MED ORDER — TRIAMCINOLONE ACETONIDE 0.1 % EX CREA: 1.0000 "application " | TOPICAL_CREAM | Freq: Two times a day (BID) | CUTANEOUS | 0 refills | Status: DC

## 2019-10-24 MED ORDER — METHYLPREDNISOLONE SODIUM SUCC 125 MG IJ SOLR
125.0000 mg | Freq: Once | INTRAMUSCULAR | Status: AC
  Administered 2019-10-24: 19:00:00 125 mg via INTRAMUSCULAR

## 2019-10-24 NOTE — ED Triage Notes (Signed)
Pt c/o rash that started at lt upper thigh area 4 days ago, now has a rash to rt inner elbow, and neck. States using otc creams with no relief.

## 2019-10-24 NOTE — Discharge Instructions (Signed)
Avoid hot showers as this can further irritate skin. Pat area dry, left fully dry before applying miconazole powder. May do this once daily. May apply 1 part triamcinolone, 1 part antifungal cream to affected areas twice daily. Return for worsening rash, testicular pain or swelling, fever.

## 2019-10-24 NOTE — ED Provider Notes (Signed)
[facility]    CSN: [id number] Arrival date & time: 10/24/19  1735      History   Chief Complaint Chief Complaint  Patient presents with  . Rash    HPI Olivier Frayre is a 49 y.o. male with history diabetes, hypertension presenting for rash to bilateral groin/thigh (L>R), right AC.  States this began about 4 days ago: Preceded by new underwear.  Patient has since discontinued underwear, though rash has been getting more itchy.  Patient denying swelling of lips, tongue, throat, difficulty breathing, chest pain, cough.  Has tried hydrocortisone, Benadryl creams without relief.   Past Medical History:  Diagnosis Date  . Diabetes mellitus without complication ([place])   . Dyspnea    exerting self,ie: running  . Hyperlipidemia   . Hypertension   . Pneumonia   . Pre-diabetes   . Sarcoidosis   . Sleep apnea     Patient Active Problem List   Diagnosis Date Noted  . Morbid obesity ([place]) 10/05/2017  . Sarcoidosis 08/06/2017  . OSA (obstructive sleep apnea) 08/06/2017    Past Surgical History:  Procedure Laterality Date  . KNEE ARTHROSCOPY WITH MEDIAL MENISECTOMY Right 06/18/2018   Procedure: Right knee arthroscopy, partial medial menisectomy, debridement and drainage of cyst;  Surgeon: [name], MD;  Location: [facility];  Service: Orthopedics;  Laterality: Right;  60 mins  . LOBECTOMY Left 2002   lower left  ( cyst on the lobe)in [facility] in [place], [place] Medications    Prior to Admission medications   Medication Sig Start Date End Date Taking? Authorizing Provider  albuterol (ACCUNEB) 0.63 MG/3ML nebulizer solution Take 3 mLs (0.63 mg total) by nebulization every 6 (six) hours as needed for wheezing. 02/25/18   [name], [name], PA-C  albuterol (PROVENTIL HFA;VENTOLIN HFA) 108 (90 Base) MCG/ACT inhaler Inhale 2 puffs into the lungs every 4 (four) hours as needed for wheezing or shortness of breath. 02/25/18   [name], [name], PA-C  aspirin  EC 325 MG tablet Take 1 tablet (325 mg total) by mouth daily. 06/18/18   [name], MD  blood glucose meter kit and supplies Check blood sugar 2 times daily (E11.9). 08/17/18   [name], [name], PA-C  clotrimazole (LOTRIMIN) 1 % cream Apply to affected area 2 times daily 10/24/19   Hall-Potvin, [place], PA-C  famotidine (PEPCID) 20 MG tablet Take 1 tablet (20 mg total) by mouth daily. 12/23/18   [name], [name], PA-C  hydrOXYzine (ATARAX/VISTARIL) 25 MG tablet Take 1 tablet (25 mg total) by mouth every 6 (six) hours. 10/24/19   Hall-Potvin, [place], PA-C  losartan (COZAAR) 100 MG tablet Take 1 tablet (100 mg total) by mouth daily. 10/07/19   [name], [name], PA-C  metFORMIN (GLUCOPHAGE) 500 MG tablet TAKE 1 TABLET BY MOUTH EVERY DAY 10/14/19   [name], [name], PA-C  Miconazole Nitrate 2 % AERP Apply 1 application topically daily. 10/24/19   Hall-Potvin, [place], PA-C  Multiple Vitamins-Minerals (MENS ONE DAILY PO) Take 1 tablet by mouth daily.     [provider]  tamsulosin (FLOMAX) 0.4 MG CAPS capsule TAKE 1 CAPSULE BY MOUTH EVERY DAY 03/10/19   [name], [name], PA-C  triamcinolone cream (KENALOG) 0.1 % Apply 1 application topically 2 (two) times daily. 10/24/19   Hall-Potvin, [place], PA-C    Family History Family History  Problem Relation Age of Onset  . COPD Mother   . Alcohol abuse Father   . Arthritis Father   . Diabetes Father   .  Heart disease Father   . Hyperlipidemia Father   . Kidney disease Father   . Diabetes Brother   . Stroke Brother   . Alcohol abuse Brother   . Diabetes Brother   . Drug abuse Brother     Social History Social History   Tobacco Use  . Smoking status: Never Smoker  . Smokeless tobacco: Never Used  Substance Use Topics  . Alcohol use: Yes    Comment: ocassionally  . Drug use: No     Allergies   Naproxen and Penicillins   Review of Systems As per HPI   Physical Exam Triage Vital Signs ED Triage Vitals  Enc Vitals Group     BP  10/24/19 1801 (!) 161/78     Pulse Rate 10/24/19 1801 (!) 53     Resp 10/24/19 1801 16     Temp 10/24/19 1801 98.1 F (36.7 C)     Temp Source 10/24/19 1801 Oral     SpO2 10/24/19 1801 95 %     Weight --      Height --      Head Circumference --      Peak Flow --      Pain Score 10/24/19 1805 0     Pain Loc --      Pain Edu? --      Excl. in GC? --    No data found.  Updated Vital Signs BP (!) 161/78 (BP Location: Left Arm)   Pulse (!) 53   Temp 98.1 F (36.7 C) (Oral)   Resp 16   SpO2 95%   Visual Acuity Right Eye Distance:   Left Eye Distance:   Bilateral Distance:    Right Eye Near:   Left Eye Near:    Bilateral Near:     Physical Exam Constitutional:      General: He is not in acute distress. HENT:     Head: Normocephalic and atraumatic.  Eyes:     General: No scleral icterus.    Pupils: Pupils are equal, round, and reactive to light.  Cardiovascular:     Rate and Rhythm: Normal rate.  Pulmonary:     Effort: Pulmonary effort is normal. No respiratory distress.     Breath sounds: No wheezing.  Skin:    Coloration: Skin is not jaundiced or pale.     Findings: Rash present.     Comments: Scant dermatitis to right AC, bilateral groin dermatitis noted (L>R)  Neurological:     Mental Status: He is alert and oriented to person, place, and time.      UC Treatments / Results  Labs (all labs ordered are listed, but only abnormal results are displayed) Labs Reviewed - No data to display  EKG   Radiology No results found.  Procedures Procedures (including critical care time)  Medications Ordered in UC Medications  methylPREDNISolone sodium succinate (SOLU-MEDROL) 125 mg/2 mL injection 125 mg (125 mg Intramuscular Given 10/24/19 1830)    Initial Impression / Assessment and Plan / UC Course  I have reviewed the triage vital signs and the nursing notes.  Pertinent labs & imaging results that were available during my care of the patient were reviewed  by me and considered in my medical decision making (see chart for details).     Patient febrile, nontoxic in office today.  Patient given Solu-Medrol injection which he tolerated well.  Will do hydroxyzine at night for pruritus, sleep as well as one-to-one ratio of topical steroid/antifungal given   patient's history of diabetes.  No testicular or penile involvement at this time.  Return precautions discussed, patient verbalized understanding and is agreeable to plan. Final Clinical Impressions(s) / UC Diagnoses   Final diagnoses:  Rash     Discharge Instructions     Avoid hot showers as this can further irritate skin. Pat area dry, left fully dry before applying miconazole powder. May do this once daily. May apply 1 part triamcinolone, 1 part antifungal cream to affected areas twice daily. Return for worsening rash, testicular pain or swelling, fever.    ED Prescriptions    Medication Sig Dispense Auth. Provider   hydrOXYzine (ATARAX/VISTARIL) 25 MG tablet Take 1 tablet (25 mg total) by mouth every 6 (six) hours. 12 tablet Hall-Potvin, Brittany, PA-C   clotrimazole (LOTRIMIN) 1 % cream Apply to affected area 2 times daily 15 g Hall-Potvin, Brittany, PA-C   triamcinolone cream (KENALOG) 0.1 % Apply 1 application topically 2 (two) times daily. 30 g Hall-Potvin, Brittany, PA-C   Miconazole Nitrate 2 % AERP Apply 1 application topically daily. 85 g Hall-Potvin, Brittany, PA-C     PDMP not reviewed this encounter.   Hall-Potvin, Brittany, PA-C 10/24/19 1839  

## 2019-12-13 ENCOUNTER — Encounter: Payer: PRIVATE HEALTH INSURANCE | Admitting: Medical

## 2019-12-13 DIAGNOSIS — Z0289 Encounter for other administrative examinations: Secondary | ICD-10-CM

## 2020-01-06 ENCOUNTER — Telehealth: Payer: Self-pay | Admitting: Medical

## 2020-01-06 MED ORDER — METFORMIN HCL 500 MG PO TABS: 500.0000 mg | ORAL_TABLET | Freq: Every day | ORAL | 1 refills | Status: DC

## 2020-01-06 MED ORDER — LOSARTAN POTASSIUM 100 MG PO TABS: 100.0000 mg | ORAL_TABLET | Freq: Every day | ORAL | 0 refills | Status: DC

## 2020-01-06 NOTE — Telephone Encounter (Signed)
Medication: metFORMIN (GLUCOPHAGE) 500 MG tablet [[id number]]    losartan (COZAAR) 100 MG tablet [[id number]]   Has the patient contacted their pharmacy? No. (If no, request that the patient contact the pharmacy for the refill.) (If yes, when and what did the pharmacy advise?)  Preferred Pharmacy (with phone number or street name):  [id number] [address], [place] Phone:  [phone]  Fax:  [phone]      Agent: Please be advised that RX refills may take up to 3 business days. We ask that you follow-up with your pharmacy.

## 2020-01-06 NOTE — Telephone Encounter (Signed)
Rx sent 

## 2020-03-04 ENCOUNTER — Other Ambulatory Visit: Payer: Self-pay | Admitting: Medical

## 2020-03-17 ENCOUNTER — Telehealth: Payer: Self-pay | Admitting: Medical

## 2020-03-17 DIAGNOSIS — N401 Enlarged prostate with lower urinary tract symptoms: Secondary | ICD-10-CM

## 2020-03-26 MED ORDER — TAMSULOSIN HCL 0.4 MG PO CAPS: 0.4000 mg | ORAL_CAPSULE | Freq: Every day | ORAL | 3 refills | Status: DC

## 2020-03-26 NOTE — Telephone Encounter (Signed)
Medication: tamsulosin (FLOMAX) 0.4 MG CAPS capsule    Has the patient contacted their pharmacy? No. (If no, request that the patient contact the pharmacy for the refill.) (If yes, when and what did the pharmacy advise?)  Preferred Pharmacy (with phone number or street name):  [id number] [address], [place] Phone:  [phone]  Fax:  [phone]       Agent: Please be advised that RX refills may take up to 3 business days. We ask that you follow-up with your pharmacy.

## 2020-03-26 NOTE — Telephone Encounter (Signed)
Rx sent 

## 2020-03-26 NOTE — Addendum Note (Signed)
Addended by: Jeronimo Greaves on: 03/26/2020 01:45 PM   Modules accepted: Orders

## 2020-05-07 ENCOUNTER — Other Ambulatory Visit: Payer: Self-pay | Admitting: Medical

## 2020-07-08 ENCOUNTER — Other Ambulatory Visit: Payer: Self-pay | Admitting: Medical

## 2020-07-16 ENCOUNTER — Other Ambulatory Visit: Payer: Self-pay | Admitting: Medical

## 2020-08-24 ENCOUNTER — Telehealth: Payer: Self-pay | Admitting: Medical

## 2020-08-24 ENCOUNTER — Other Ambulatory Visit: Payer: Self-pay | Admitting: Medical

## 2020-08-24 MED ORDER — LOSARTAN POTASSIUM 100 MG PO TABS: 100.0000 mg | ORAL_TABLET | Freq: Every day | ORAL | 0 refills | Status: DC

## 2020-08-24 MED ORDER — METFORMIN HCL 500 MG PO TABS: 500.0000 mg | ORAL_TABLET | Freq: Every day | ORAL | 1 refills | Status: DC

## 2020-08-24 NOTE — Telephone Encounter (Signed)
Rx sent 

## 2020-08-24 NOTE — Telephone Encounter (Signed)
Medication: metFORMIN (GLUCOPHAGE) 500 MG tablet [[id number]]   losartan (COZAAR) 100 MG tablet [[id number]]     Has the patient contacted their pharmacy? NO (If no, request that the patient contact the pharmacy for the refill.) (If yes, when and what did the pharmacy advise?)    Preferred Pharmacy (with phone number or street name): [id number] [address], [place] Phone:  [phone]  Fax:  [phone]          Agent: Please be advised that RX refills may take up to 3 business days. We ask that you follow-up with your pharmacy.

## 2020-09-07 ENCOUNTER — Encounter: Payer: Self-pay | Admitting: Medical

## 2020-10-20 ENCOUNTER — Other Ambulatory Visit: Payer: Self-pay | Admitting: Medical

## 2020-10-31 ENCOUNTER — Encounter: Payer: 59 | Admitting: Medical

## 2020-10-31 ENCOUNTER — Other Ambulatory Visit: Payer: Self-pay

## 2020-11-01 ENCOUNTER — Encounter: Payer: Self-pay | Admitting: Medical

## 2020-11-01 ENCOUNTER — Telehealth: Payer: 59 | Admitting: Medical

## 2020-11-01 DIAGNOSIS — R059 Cough, unspecified: Secondary | ICD-10-CM

## 2020-11-01 DIAGNOSIS — J40 Bronchitis, not specified as acute or chronic: Secondary | ICD-10-CM

## 2020-11-01 MED ORDER — AZITHROMYCIN 250 MG PO TABS: ORAL_TABLET | ORAL | 0 refills | Status: AC

## 2020-11-01 MED ORDER — BENZONATATE 100 MG PO CAPS: 100.0000 mg | ORAL_CAPSULE | Freq: Three times a day (TID) | ORAL | 0 refills | Status: DC | PRN

## 2020-11-01 NOTE — Patient Instructions (Signed)
Probable bronchitis with described pharyngitis as well.  Rest, hydrate and Tylenol for fever.  Prescribed azithromycin antibiotic and benzonatate cough tablets.  Patient expresses for COVID but for caution sake advised to get over-the-counter test and check tomorrow and again on Sunday.  If any positive results will know.  Follow-up hopefully sometime next week for planned wellness exam/CPE or follow-up for above condition as well.  No charge today as patient was in [place].  Was not aware of this and still got on the phone attempted video visit.

## 2020-11-01 NOTE — Progress Notes (Signed)
Subjective:    Patient ID: Jackson Hatfield, male    DOB: 07-18-1970, 50 y.o.   MRN: [id number]  HPI Talk to patient by phone today. Patient initially scheduled for virtual visit today to discuss some cough/upper respiratory infection type signs and symptoms.  Video quality was good but audio was not.  Patient stated he had difficulty hearing me.  After trying to improve audio quality so he could hear me discovered that he was in [place].  Eventually video connection failed and decided I would call patient later and assess him but not charge his insurance since he is out of state.  Pt states Thursday got caught in rain. Saturday he started to get chills and sweats. Next day had st. Now has chest congestion. He states likely has bronchitis. Pt states he is coughing up mucus.  Gets about once a year.   Pt had covid end of December. No hx of vaccines.   No wheezing and no sob. Pt has albuterol inhalers. Cough is not keeping him up.    Review of Systems  Constitutional: Positive for chills and diaphoresis. Negative for fatigue and fever.  HENT: Negative for sinus pressure and sinus pain.   Respiratory: Positive for cough. Negative for chest tightness, shortness of breath and wheezing.   Cardiovascular: Negative for chest pain and palpitations.  Gastrointestinal: Negative for abdominal pain.  Musculoskeletal: Negative for back pain.  Skin: Negative for rash.  Neurological: Negative for dizziness.  Hematological: Negative for adenopathy. Does not bruise/bleed easily.  Psychiatric/Behavioral: Negative for behavioral problems and confusion.    Past Medical History:  Diagnosis Date  . Diabetes mellitus without complication ([place])   . Dyspnea    exerting self,ie: running  . Hyperlipidemia   . Hypertension   . Pneumonia   . Pre-diabetes   . Sarcoidosis   . Sleep apnea      Social History   Socioeconomic History  . Marital status: Married    Spouse name: Not on file  . Number  of children: Not on file  . Years of education: Not on file  . Highest education level: Not on file  Occupational History  . Not on file  Tobacco Use  . Smoking status: Never Smoker  . Smokeless tobacco: Never Used  Vaping Use  . Vaping Use: Never used  Substance and Sexual Activity  . Alcohol use: Yes    Comment: ocassionally  . Drug use: No  . Sexual activity: Not on file  Other Topics Concern  . Not on file  Social History Narrative  . Not on file   Social Determinants of Health   Financial Resource Strain: Not on file  Food Insecurity: Not on file  Transportation Needs: Not on file  Physical Activity: Not on file  Stress: Not on file  Social Connections: Not on file  Intimate Partner Violence: Not on file    Past Surgical History:  Procedure Laterality Date  . KNEE ARTHROSCOPY WITH MEDIAL MENISECTOMY Right 06/18/2018   Procedure: Right knee arthroscopy, partial medial menisectomy, debridement and drainage of cyst;  Surgeon: [name], MD;  Location: [facility];  Service: Orthopedics;  Laterality: Right;  60 mins  . LOBECTOMY Left 2002   lower left  ( cyst on the lobe)in [facility]. [facility] in [place], [place]    Family History  Problem Relation Age of Onset  . COPD Mother   . Alcohol abuse Father   . Arthritis Father   . Diabetes Father   .  Heart disease Father   . Hyperlipidemia Father   . Kidney disease Father   . Diabetes Brother   . Stroke Brother   . Alcohol abuse Brother   . Diabetes Brother   . Drug abuse Brother     Allergies  Allergen Reactions  . Naproxen Hives  . Penicillins Hives    Current Outpatient Medications on File Prior to Visit  Medication Sig Dispense Refill  . albuterol (ACCUNEB) 0.63 MG/3ML nebulizer solution Take 3 mLs (0.63 mg total) by nebulization every 6 (six) hours as needed for wheezing. 75 mL 5  . albuterol (PROVENTIL HFA;VENTOLIN HFA) 108 (90 Base) MCG/ACT inhaler Inhale 2 puffs into the lungs every 4 (four) hours as  needed for wheezing or shortness of breath. 1 Inhaler 2  . aspirin EC 325 MG tablet Take 1 tablet (325 mg total) by mouth daily. 30 tablet 0  . blood glucose meter kit and supplies Check blood sugar 2 times daily (E11.9). 1 each 0  . clotrimazole (LOTRIMIN) 1 % cream Apply to affected area 2 times daily 15 g 0  . famotidine (PEPCID) 20 MG tablet Take 1 tablet (20 mg total) by mouth daily. 30 tablet 0  . hydrOXYzine (ATARAX/VISTARIL) 25 MG tablet Take 1 tablet (25 mg total) by mouth every 6 (six) hours. 12 tablet 0  . losartan (COZAAR) 100 MG tablet TAKE 1 TABLET BY MOUTH EVERY DAY 60 tablet 0  . metFORMIN (GLUCOPHAGE) 500 MG tablet Take 1 tablet (500 mg total) by mouth daily. 30 tablet 1  . Miconazole Nitrate 2 % AERP Apply 1 application topically daily. 85 g 0  . Multiple Vitamins-Minerals (MENS ONE DAILY PO) Take 1 tablet by mouth daily.     . tamsulosin (FLOMAX) 0.4 MG CAPS capsule Take 1 capsule (0.4 mg total) by mouth daily. 90 capsule 3  . triamcinolone cream (KENALOG) 0.1 % Apply 1 application topically 2 (two) times daily. 30 g 0   No current facility-administered medications on file prior to visit.    Temp 98.7 F (37.1 C)      Objective:   Physical Exam Saw briefly by video before audio issues became apparent.  General-no acute distress, pleasant, oriented. Lungs- on inspection lungs appear unlabored. Neck- no tracheal deviation or jvd on inspection. Neuro- gross motor function appears intact.      Assessment & Plan:  Probable bronchitis with described pharyngitis as well.  Rest, hydrate and Tylenol for fever.  Prescribed azithromycin antibiotic and benzonatate cough tablets.  Patient expresses for COVID but for caution sake advised to get over-the-counter test and check tomorrow and again on Sunday.  If any positive results will know.  Follow-up hopefully sometime next week for planned wellness exam/CPE or follow-up for above condition as well.  No charge today as  patient was in [place].  Was not aware of this and still got on the phone attempted video visit.

## 2020-11-23 ENCOUNTER — Ambulatory Visit
Admission: EM | Admit: 2020-11-23 | Discharge: 2020-11-23 | Disposition: A | Discharge status: Home / Self Care | Payer: 59 | Attending: Student | Admitting: Student

## 2020-11-23 ENCOUNTER — Other Ambulatory Visit: Payer: Self-pay

## 2020-11-23 ENCOUNTER — Ambulatory Visit (INDEPENDENT_AMBULATORY_CARE_PROVIDER_SITE_OTHER): Payer: 59

## 2020-11-23 DIAGNOSIS — R062 Wheezing: Secondary | ICD-10-CM | POA: Diagnosis not present

## 2020-11-23 DIAGNOSIS — R509 Fever, unspecified: Secondary | ICD-10-CM

## 2020-11-23 DIAGNOSIS — D860 Sarcoidosis of lung: Secondary | ICD-10-CM | POA: Diagnosis not present

## 2020-11-23 DIAGNOSIS — R0602 Shortness of breath: Secondary | ICD-10-CM | POA: Diagnosis not present

## 2020-11-23 DIAGNOSIS — J209 Acute bronchitis, unspecified: Secondary | ICD-10-CM | POA: Diagnosis not present

## 2020-11-23 DIAGNOSIS — I10 Essential (primary) hypertension: Secondary | ICD-10-CM

## 2020-11-23 DIAGNOSIS — Z1152 Encounter for screening for COVID-19: Secondary | ICD-10-CM

## 2020-11-23 DIAGNOSIS — E1169 Type 2 diabetes mellitus with other specified complication: Secondary | ICD-10-CM | POA: Diagnosis not present

## 2020-11-23 MED ORDER — OSELTAMIVIR PHOSPHATE 75 MG PO CAPS: 75.0000 mg | ORAL_CAPSULE | Freq: Two times a day (BID) | ORAL | 0 refills | Status: DC

## 2020-11-23 MED ORDER — PROMETHAZINE-DM 6.25-15 MG/5ML PO SYRP: 5.0000 mL | ORAL_SOLUTION | Freq: Four times a day (QID) | ORAL | 0 refills | Status: DC | PRN

## 2020-11-23 MED ORDER — ALBUTEROL SULFATE HFA 108 (90 BASE) MCG/ACT IN AERS
1.0000 | INHALATION_SPRAY | Freq: Once | RESPIRATORY_TRACT | Status: AC
  Administered 2020-11-23: 2 via RESPIRATORY_TRACT

## 2020-11-23 MED ORDER — PREDNISONE 20 MG PO TABS: 40.0000 mg | ORAL_TABLET | Freq: Every day | ORAL | 0 refills | Status: AC

## 2020-11-23 MED ORDER — AZITHROMYCIN 250 MG PO TABS: 250.0000 mg | ORAL_TABLET | Freq: Every day | ORAL | 0 refills | Status: DC

## 2020-11-23 MED ORDER — ACETAMINOPHEN 325 MG PO TABS
650.0000 mg | ORAL_TABLET | Freq: Once | ORAL | Status: AC
  Administered 2020-11-23: 650 mg via ORAL

## 2020-11-23 MED ORDER — LIDOCAINE VISCOUS HCL 2 % MT SOLN: 15.0000 mL | OROMUCOSAL | 0 refills | Status: DC | PRN

## 2020-11-23 NOTE — Discharge Instructions (Addendum)
-  Prednisone, 2 pills taken at the same time for 5 days in a row.  Try taking this earlier in the day as it can give you energy.  Try to avoid ibuprofen while taking this medication as the combination can increase your chance of gastritis and gastrointestinal bleed. -Promethazine DM cough syrup for congestion/cough. This could make you drowsy, so take at night before bed. -For sore throat, use lidocaine mouthwash up to every 4 hours. Make sure not to eat for at least 1 hour after using this, as your mouth will be very numb and you could bite yourself. -Tamiflu twice daily for 5 days. This is an antiviral that reduces the severity and duration of the flu. -Z-pack to prevent pneumonia, follow dosage instructions on package -Continue albuterol inhaler as needed for cough and shortness of breath -For fevers/chills, bodyaches, headaches- tylenol  -Please check your blood pressure at home or at the pharmacy. If this continues to be >140/90, follow-up with your primary care provider for further blood pressure management/ medication titration. If you develop chest pain, shortness of breath, vision changes, the worst headache of your life- head straight to the ED or call 911. -Follow-up with PCP as scheduled

## 2020-11-23 NOTE — ED Triage Notes (Addendum)
Patient presents to Urgent Care with complaints of fever, headache, sore throat, chills, SOB, and chest tightness since yesterday. Pt states he has a hx of bronchitis. Treating symptoms with albuterol last dose 0500.

## 2020-11-23 NOTE — ED Provider Notes (Signed)
[facility]    CSN: [id number] Arrival date & time: 11/23/20  1534      History   Chief Complaint Chief Complaint  Patient presents with  . Sore Throat  . Fever  . Chills    HPI Jackson Hatfield is a 50 y.o. male presenting with viral symptoms for 1 day.  Medical history diabetes, hyperlipidemia, hypertension, pneumonia, prediabetes, pulmonary sarcoidosis, morbid obesity.  Notes fever/chills, headaches, sore throat, shortness of breath, constant left-sided chest tightness.  Has not monitored temperature at home.  Last uses albuterol inhaler at 0500 (12 hours ago). Cough is productive of clear sputum. Denies weakness, dizziness, severe headache, abdominal pain, nausea, vomiting, diarrhea.  States lungs have not been giving him trouble for few years now.  HPI  Past Medical History:  Diagnosis Date  . Diabetes mellitus without complication ([place])   . Dyspnea    exerting self,ie: running  . Hyperlipidemia   . Hypertension   . Pneumonia   . Pre-diabetes   . Sarcoidosis   . Sleep apnea     Patient Active Problem List   Diagnosis Date Noted  . Morbid obesity ([place]) 10/05/2017  . Sarcoidosis 08/06/2017  . OSA (obstructive sleep apnea) 08/06/2017    Past Surgical History:  Procedure Laterality Date  . KNEE ARTHROSCOPY WITH MEDIAL MENISECTOMY Right 06/18/2018   Procedure: Right knee arthroscopy, partial medial menisectomy, debridement and drainage of cyst;  Surgeon: [name], MD;  Location: [facility];  Service: Orthopedics;  Laterality: Right;  60 mins  . LOBECTOMY Left 2002   lower left  ( cyst on the lobe)in [facility] in [place] Medications    Prior to Admission medications   Medication Sig Start Date End Date Taking? Authorizing Provider  azithromycin (ZITHROMAX Z-PAK) 250 MG tablet Take 1 tablet (250 mg total) by mouth daily. Z-Pak: 2 pills on day 1, 1 pill/day on days 2 through 5. 11/23/20  Yes Hazel Sams, PA-C  lidocaine  (XYLOCAINE) 2 % solution Use as directed 15 mLs in the mouth or throat as needed for mouth pain. 11/23/20  Yes Hazel Sams, PA-C  oseltamivir (TAMIFLU) 75 MG capsule Take 1 capsule (75 mg total) by mouth every 12 (twelve) hours. 11/23/20  Yes Hazel Sams, PA-C  predniSONE (DELTASONE) 20 MG tablet Take 2 tablets (40 mg total) by mouth daily for 5 days. 11/23/20 11/28/20 Yes Hazel Sams, PA-C  promethazine-dextromethorphan (PROMETHAZINE-DM) 6.25-15 MG/5ML syrup Take 5 mLs by mouth 4 (four) times daily as needed for cough. 11/23/20  Yes Hazel Sams, PA-C  albuterol (ACCUNEB) 0.63 MG/3ML nebulizer solution Take 3 mLs (0.63 mg total) by nebulization every 6 (six) hours as needed for wheezing. 02/25/18   [name], [name], PA-C  albuterol (PROVENTIL HFA;VENTOLIN HFA) 108 (90 Base) MCG/ACT inhaler Inhale 2 puffs into the lungs every 4 (four) hours as needed for wheezing or shortness of breath. 02/25/18   [name], [name], PA-C  aspirin EC 325 MG tablet Take 1 tablet (325 mg total) by mouth daily. 06/18/18   [name], MD  benzonatate (TESSALON) 100 MG capsule Take 1 capsule (100 mg total) by mouth 3 (three) times daily as needed for cough. 11/01/20   [name], [name], PA-C  blood glucose meter kit and supplies Check blood sugar 2 times daily (E11.9). 08/17/18   [name], [name], PA-C  clotrimazole (LOTRIMIN) 1 % cream Apply to affected area 2 times daily 10/24/19   Hall-Potvin, [place], [place]  famotidine (PEPCID) 20 MG tablet Take 1 tablet (20 mg total) by mouth daily. 12/23/18   [name], [name], PA-C  hydrOXYzine (ATARAX/VISTARIL) 25 MG tablet Take 1 tablet (25 mg total) by mouth every 6 (six) hours. 10/24/19   Hall-Potvin, [place], PA-C  losartan (COZAAR) 100 MG tablet TAKE 1 TABLET BY MOUTH EVERY DAY 10/22/20   [name], [name], PA-C  metFORMIN (GLUCOPHAGE) 500 MG tablet Take 1 tablet (500 mg total) by mouth daily. 08/24/20   [name], [name], PA-C  Miconazole Nitrate 2 % AERP Apply 1 application topically daily.  10/24/19   Hall-Potvin, [place], PA-C  Multiple Vitamins-Minerals (MENS ONE DAILY PO) Take 1 tablet by mouth daily.     [provider]  tamsulosin (FLOMAX) 0.4 MG CAPS capsule Take 1 capsule (0.4 mg total) by mouth daily. 03/26/20   [name], [name], PA-C  triamcinolone cream (KENALOG) 0.1 % Apply 1 application topically 2 (two) times daily. 10/24/19   Hall-Potvin, [place], PA-C    Family History Family History  Problem Relation Age of Onset  . COPD Mother   . Alcohol abuse Father   . Arthritis Father   . Diabetes Father   . Heart disease Father   . Hyperlipidemia Father   . Kidney disease Father   . Diabetes Brother   . Stroke Brother   . Alcohol abuse Brother   . Diabetes Brother   . Drug abuse Brother     Social History Social History   Tobacco Use  . Smoking status: Never Smoker  . Smokeless tobacco: Never Used  Vaping Use  . Vaping Use: Never used  Substance Use Topics  . Alcohol use: Yes    Comment: ocassionally  . Drug use: No     Allergies   Naproxen and Penicillins   Review of Systems Review of Systems  Constitutional: Positive for chills and fever. Negative for appetite change.  HENT: Positive for congestion. Negative for ear pain, rhinorrhea, sinus pressure, sinus pain and sore throat.   Eyes: Negative for redness and visual disturbance.  Respiratory: Positive for cough, chest tightness, shortness of breath and wheezing.   Cardiovascular: Negative for chest pain and palpitations.  Gastrointestinal: Negative for abdominal pain, constipation, diarrhea, nausea and vomiting.  Genitourinary: Negative for dysuria, frequency and urgency.  Musculoskeletal: Positive for myalgias.  Neurological: Negative for dizziness, weakness and headaches.  Psychiatric/Behavioral: Negative for confusion.  All other systems reviewed and are negative.    Physical Exam Triage Vital Signs ED Triage Vitals  Enc Vitals Group     BP 11/23/20 1548 (S) (!) 162/81      Pulse Rate 11/23/20 1548 93     Resp 11/23/20 1548 (S) (!) 22     Temp 11/23/20 1548 (S) (!) 101 F (38.3 C)     Temp Source 11/23/20 1548 Oral     SpO2 11/23/20 1548 93 %     Weight --      Height --      Head Circumference --      Peak Flow --      Pain Score 11/23/20 1603 5     Pain Loc --      Pain Edu? --      Excl. in [place]? --    No data found.  Updated Vital Signs BP (S) (!) 162/81 (BP Location: Left Arm) Comment: States he has noted BP elevated over the past few days  Pulse 93   Temp (S) (!) 101 F (38.3 C) (Oral) Comment: provider notified of  abnormal vitals  Resp (S) (!) 22   SpO2 94%   Visual Acuity Right Eye Distance:   Left Eye Distance:   Bilateral Distance:    Right Eye Near:   Left Eye Near:    Bilateral Near:     Physical Exam Vitals reviewed.  Constitutional:      General: He is not in acute distress.    Appearance: Normal appearance. He is ill-appearing. He is not diaphoretic.  HENT:     Head: Normocephalic and atraumatic.     Right Ear: Hearing, tympanic membrane, ear canal and external ear normal. No swelling or tenderness. There is no impacted cerumen. No mastoid tenderness. Tympanic membrane is not perforated, erythematous, retracted or bulging.     Left Ear: Hearing, tympanic membrane, ear canal and external ear normal. No swelling or tenderness. There is no impacted cerumen. No mastoid tenderness. Tympanic membrane is not perforated, erythematous, retracted or bulging.     Nose:     Right Sinus: No maxillary sinus tenderness or frontal sinus tenderness.     Left Sinus: No maxillary sinus tenderness or frontal sinus tenderness.     Mouth/Throat:     Mouth: Mucous membranes are moist.     Pharynx: Uvula midline. No oropharyngeal exudate or posterior oropharyngeal erythema.     Tonsils: No tonsillar exudate.  Eyes:     Extraocular Movements: Extraocular movements intact.     Pupils: Pupils are equal, round, and reactive to light.   Cardiovascular:     Rate and Rhythm: Normal rate and regular rhythm.     Pulses:          Radial pulses are 2+ on the right side and 2+ on the left side.     Heart sounds: Normal heart sounds.     Comments: No pedal edema.  Negative Homans' sign. Pulmonary:     Effort: Pulmonary effort is normal. Tachypnea present. No bradypnea, accessory muscle usage, prolonged expiration, respiratory distress or retractions.     Breath sounds: Normal air entry. No stridor. Wheezing and rhonchi present. No decreased breath sounds or rales.     Comments: Wheezes and rhonchi throughout. Chest:     Chest wall: No tenderness.     Comments: Chest pain is not reproducible. Abdominal:     General: Abdomen is flat. Bowel sounds are normal.     Palpations: Abdomen is soft.     Tenderness: There is no abdominal tenderness. There is no guarding or rebound.  Musculoskeletal:     Right lower leg: No edema.     Left lower leg: No edema.  Lymphadenopathy:     Cervical: No cervical adenopathy.  Skin:    General: Skin is warm.     Capillary Refill: Capillary refill takes less than 2 seconds.  Neurological:     General: No focal deficit present.     Mental Status: He is alert and oriented to person, place, and time.  Psychiatric:        Attention and Perception: Attention and perception normal.        Mood and Affect: Mood and affect normal.        Behavior: Behavior normal. Behavior is cooperative.        Thought Content: Thought content normal.        Judgment: Judgment normal.      UC Treatments / Results  Labs (all labs ordered are listed, but only abnormal results are displayed) Labs Reviewed  COVID-19, FLU A+B NAA  EKG   Radiology DG Chest 2 View  Result Date: 11/23/2020 CLINICAL DATA:  Shortness of breath. Wheezing. Pulmonary sarcoidosis. EXAM: CHEST - 2 VIEW COMPARISON:  August 06, 2017. FINDINGS: Similar prominence of the right hilum, bibasilar interstitial opacities with suspected  fibrosis, and scattered reticulonodular opacities. No visible pleural effusions or pneumothorax. Similar cardiac silhouette. No acute osseous abnormality. IMPRESSION: Similar prominence of the right hilum, bibasilar interstitial opacities with suspected fibrosis, and scattered reticulonodular opacities. Findings are most likely related to the patient's reported pulmonary sarcoidosis. A high-resolution chest CT chest could further characterize if clinically indicated. Electronically Signed   By: Margaretha Sheffield MD   On: 11/23/2020 16:44    Procedures Procedures (including critical care time)  Medications Ordered in UC Medications  acetaminophen (TYLENOL) tablet 650 mg (650 mg Oral Given 11/23/20 1600)  albuterol (VENTOLIN HFA) 108 (90 Base) MCG/ACT inhaler 1-2 puff (2 puffs Inhalation Given 11/23/20 1600)    Initial Impression / Assessment and Plan / UC Course  I have reviewed the triage vital signs and the nursing notes.  Pertinent labs & imaging results that were available during my care of the patient were reviewed by me and considered in my medical decision making (see chart for details).     This patient is a 50 year old male presenting with acute bronchitis. Initially febrile at 101 and borderline tachypneic at 22, following administration of Tylenol and albuterol- reduction in temperature to 100.2, respirations to 20. This patient does have a history of pulmonary sarcoidosis.  Constant left-sided chest pressure, not reproducible. EKG NSR, unchanged from 2019 EKG. No pedal edema, calf tenderness. Suspect chest pain is pulmonary in nature.  CXR- Similar prominence of the right hilum, bibasilar interstitial opacities with suspected fibrosis, and scattered reticulonodular opacities. Findings are most likely related to the patient's reported pulmonary sarcoidosis. A high-resolution chest CT chest could further characterize if clinically indicated.  Low suspicion for pneumonia based on CXR.  However given history of pulmonary sarcoidosis and pneumonia, will treat with abx prophylactially. he is penicillin allergic, so zpack as below.   For type 2 diabetes, sugars are running 110s at home. Low dose of prednisone as below.   Covid PCR and influenza sent. I suspect that this patient has influenza based on symptoms.  Discussed risks and benefits of Tamiflu versus symptomatic management, patient wishes to proceed with Tamiflu.  Sent.  Promethazine and viscous lidocaine for symptomatic relief.  Also recommended Tylenol.  F/u with PCP as scheduled on 6/17.  Strict ED return precautions discussed..  Coding this visit a level 5 as I spent over 50 minutes evaluating this patient's complex symptoms and medical history including pulmonary sarcoidosis, performing exam, ordering and interpreting tests, prescribing medications, and discussing plan for follow-up.  Final Clinical Impressions(s) / UC Diagnoses   Final diagnoses:  Pulmonary sarcoidosis ([place])  Type 2 diabetes mellitus with other specified complication, without long-term current use of insulin (HCC)  Febrile illness  Essential hypertension  Acute bronchitis, unspecified organism  Encounter for screening for COVID-19     Discharge Instructions     -Prednisone, 2 pills taken at the same time for 5 days in a row.  Try taking this earlier in the day as it can give you energy.  Try to avoid ibuprofen while taking this medication as the combination can increase your chance of gastritis and gastrointestinal bleed. -Promethazine DM cough syrup for congestion/cough. This could make you drowsy, so take at night before bed. -For sore throat, use lidocaine mouthwash  up to every 4 hours. Make sure not to eat for at least 1 hour after using this, as your mouth will be very numb and you could bite yourself. -Tamiflu twice daily for 5 days. This is an antiviral that reduces the severity and duration of the flu. -Z-pack to prevent pneumonia,  follow dosage instructions on package -Continue albuterol inhaler as needed for cough and shortness of breath -For fevers/chills, bodyaches, headaches- tylenol  -Please check your blood pressure at home or at the pharmacy. If this continues to be >140/90, follow-up with your primary care provider for further blood pressure management/ medication titration. If you develop chest pain, shortness of breath, vision changes, the worst headache of your life- head straight to the ED or call 911. -Follow-up with PCP as scheduled     ED Prescriptions    Medication Sig Dispense Auth. Provider   promethazine-dextromethorphan (PROMETHAZINE-DM) 6.25-15 MG/5ML syrup Take 5 mLs by mouth 4 (four) times daily as needed for cough. 118 mL Marin Roberts E, PA-C   lidocaine (XYLOCAINE) 2 % solution Use as directed 15 mLs in the mouth or throat as needed for mouth pain. 100 mL Hazel Sams, PA-C   azithromycin (ZITHROMAX Z-PAK) 250 MG tablet Take 1 tablet (250 mg total) by mouth daily. Z-Pak: 2 pills on day 1, 1 pill/day on days 2 through 5. 6 tablet Hazel Sams, PA-C   oseltamivir (TAMIFLU) 75 MG capsule Take 1 capsule (75 mg total) by mouth every 12 (twelve) hours. 10 capsule Hazel Sams, PA-C   predniSONE (DELTASONE) 20 MG tablet Take 2 tablets (40 mg total) by mouth daily for 5 days. 10 tablet Hazel Sams, PA-C     PDMP not reviewed this encounter.   Hazel Sams, PA-C 11/23/20 1718

## 2020-11-25 LAB — COVID-19, FLU A+B NAA
Influenza A, NAA: NOT DETECTED
Influenza B, NAA: NOT DETECTED
SARS-CoV-2, NAA: DETECTED — AB

## 2020-11-26 ENCOUNTER — Telehealth (HOSPITAL_COMMUNITY): Payer: Self-pay | Admitting: Emergency Medicine

## 2020-11-26 ENCOUNTER — Encounter: Payer: 59 | Admitting: Medical

## 2020-11-27 ENCOUNTER — Telehealth: Payer: 59 | Admitting: Medical

## 2020-12-03 ENCOUNTER — Other Ambulatory Visit: Payer: Self-pay | Admitting: Medical

## 2020-12-04 ENCOUNTER — Telehealth: Payer: Self-pay | Admitting: Medical

## 2020-12-04 MED ORDER — METFORMIN HCL 500 MG PO TABS: 500.0000 mg | ORAL_TABLET | Freq: Every day | ORAL | 1 refills | Status: DC

## 2020-12-04 NOTE — Telephone Encounter (Signed)
Rx sent 

## 2020-12-04 NOTE — Telephone Encounter (Signed)
Medication: metFORMIN (GLUCOPHAGE) 500 MG tablet [[id number]]     Has the patient contacted their pharmacy? no (If no, request that the patient contact the pharmacy for the refill.) (If yes, when and what did the pharmacy advise?)    Preferred Pharmacy (with phone number or street name): [id number] [address] [place]  [address] [place] [address]  Phone:  [phone]  Fax:  [phone]     Agent: Please be advised that RX refills may take up to 3 business days. We ask that you follow-up with your pharmacy.

## 2020-12-07 ENCOUNTER — Encounter: Payer: 59 | Admitting: Medical

## 2020-12-27 ENCOUNTER — Other Ambulatory Visit: Payer: Self-pay

## 2020-12-28 ENCOUNTER — Other Ambulatory Visit: Payer: Self-pay

## 2020-12-28 ENCOUNTER — Ambulatory Visit (INDEPENDENT_AMBULATORY_CARE_PROVIDER_SITE_OTHER): Payer: 59 | Admitting: Medical

## 2020-12-28 ENCOUNTER — Emergency Department (HOSPITAL_BASED_OUTPATIENT_CLINIC_OR_DEPARTMENT_OTHER): Payer: 59

## 2020-12-28 ENCOUNTER — Encounter (HOSPITAL_BASED_OUTPATIENT_CLINIC_OR_DEPARTMENT_OTHER): Payer: Self-pay | Admitting: *Deleted

## 2020-12-28 ENCOUNTER — Emergency Department (HOSPITAL_BASED_OUTPATIENT_CLINIC_OR_DEPARTMENT_OTHER)
Admission: EM | Admit: 2020-12-28 | Discharge: 2020-12-28 | Disposition: A | Discharge status: Home / Self Care | Payer: 59 | Attending: Emergency Medicine | Admitting: Emergency Medicine

## 2020-12-28 VITALS — BP 142/80 | HR 50 | Temp 99.1°F | Ht 76.0 in | Wt 293.4 lb

## 2020-12-28 DIAGNOSIS — Z1211 Encounter for screening for malignant neoplasm of colon: Secondary | ICD-10-CM | POA: Diagnosis not present

## 2020-12-28 DIAGNOSIS — I10 Essential (primary) hypertension: Secondary | ICD-10-CM

## 2020-12-28 DIAGNOSIS — Z0001 Encounter for general adult medical examination with abnormal findings: Secondary | ICD-10-CM | POA: Diagnosis not present

## 2020-12-28 DIAGNOSIS — E119 Type 2 diabetes mellitus without complications: Secondary | ICD-10-CM

## 2020-12-28 DIAGNOSIS — Z79899 Other long term (current) drug therapy: Secondary | ICD-10-CM | POA: Diagnosis not present

## 2020-12-28 DIAGNOSIS — Z6835 Body mass index (BMI) 35.0-35.9, adult: Secondary | ICD-10-CM

## 2020-12-28 DIAGNOSIS — Z0000 Encounter for general adult medical examination without abnormal findings: Secondary | ICD-10-CM

## 2020-12-28 DIAGNOSIS — L03116 Cellulitis of left lower limb: Secondary | ICD-10-CM | POA: Insufficient documentation

## 2020-12-28 DIAGNOSIS — L02416 Cutaneous abscess of left lower limb: Secondary | ICD-10-CM | POA: Diagnosis not present

## 2020-12-28 DIAGNOSIS — Z125 Encounter for screening for malignant neoplasm of prostate: Secondary | ICD-10-CM | POA: Diagnosis not present

## 2020-12-28 DIAGNOSIS — Z7984 Long term (current) use of oral hypoglycemic drugs: Secondary | ICD-10-CM | POA: Diagnosis not present

## 2020-12-28 DIAGNOSIS — Z7982 Long term (current) use of aspirin: Secondary | ICD-10-CM | POA: Insufficient documentation

## 2020-12-28 DIAGNOSIS — L0291 Cutaneous abscess, unspecified: Secondary | ICD-10-CM

## 2020-12-28 LAB — COMPREHENSIVE METABOLIC PANEL
ALT: 14 U/L (ref 0–53)
AST: 12 U/L (ref 0–37)
Albumin: 4.3 g/dL (ref 3.5–5.2)
Alkaline Phosphatase: 73 U/L (ref 39–117)
BUN: 16 mg/dL (ref 6–23)
CO2: 28 mEq/L (ref 19–32)
Calcium: 9.2 mg/dL (ref 8.4–10.5)
Chloride: 98 mEq/L (ref 96–112)
Creatinine, Ser: 0.75 mg/dL (ref 0.40–1.50)
GFR: 104.02 mL/min (ref >60.00)
Glucose, Bld: 100 mg/dL — ABNORMAL HIGH (ref 70–99)
Potassium: 4.5 mEq/L (ref 3.5–5.1)
Sodium: 133 mEq/L — ABNORMAL LOW (ref 135–145)
Total Bilirubin: 0.5 mg/dL (ref 0.2–1.2)
Total Protein: 7 g/dL (ref 6.0–8.3)

## 2020-12-28 LAB — LIPID PANEL
Cholesterol: 177 mg/dL (ref 0–200)
HDL: 56.5 mg/dL (ref >39.00)
NonHDL: 120.17
Total CHOL/HDL Ratio: 3
Triglycerides: 205 mg/dL — ABNORMAL HIGH (ref 0.0–149.0)
VLDL: 41 mg/dL — ABNORMAL HIGH (ref 0.0–40.0)

## 2020-12-28 LAB — CBC WITH DIFFERENTIAL/PLATELET
Abs Immature Granulocytes: 0.04 10*3/uL (ref 0.00–0.07)
Basophils Absolute: 0.1 10*3/uL (ref 0.0–0.1)
Basophils Relative: 0 %
Eosinophils Absolute: 0.2 10*3/uL (ref 0.0–0.5)
Eosinophils Relative: 2 %
HCT: 40.1 % (ref 39.0–52.0)
Hemoglobin: 13.4 g/dL (ref 13.0–17.0)
Immature Granulocytes: 0 %
Lymphocytes Relative: 12 %
Lymphs Abs: 1.8 10*3/uL (ref 0.7–4.0)
MCH: 28.8 pg (ref 26.0–34.0)
MCHC: 33.4 g/dL (ref 30.0–36.0)
MCV: 86.2 fL (ref 80.0–100.0)
Monocytes Absolute: 1.4 10*3/uL — ABNORMAL HIGH (ref 0.1–1.0)
Monocytes Relative: 10 %
Neutro Abs: 10.8 10*3/uL — ABNORMAL HIGH (ref 1.7–7.7)
Neutrophils Relative %: 76 %
Platelets: 289 10*3/uL (ref 150–400)
RBC: 4.65 MIL/uL (ref 4.22–5.81)
RDW: 13.3 % (ref 11.5–15.5)
WBC: 14.4 10*3/uL — ABNORMAL HIGH (ref 4.0–10.5)
nRBC: 0 % (ref 0.0–0.2)

## 2020-12-28 LAB — PSA: PSA: 2.48 ng/mL (ref 0.10–4.00)

## 2020-12-28 LAB — BASIC METABOLIC PANEL
Anion gap: 7 (ref 5–15)
BUN: 17 mg/dL (ref 6–20)
CO2: 27 mmol/L (ref 22–32)
Calcium: 8.9 mg/dL (ref 8.9–10.3)
Chloride: 101 mmol/L (ref 98–111)
Creatinine, Ser: 0.75 mg/dL (ref 0.61–1.24)
GFR, Estimated: >60 mL/min (ref >60)
Glucose, Bld: 103 mg/dL — ABNORMAL HIGH (ref 70–99)
Potassium: 4.2 mmol/L (ref 3.5–5.1)
Sodium: 135 mmol/L (ref 135–145)

## 2020-12-28 LAB — HEMOGLOBIN A1C: Hgb A1c MFr Bld: 6.2 % (ref 4.6–6.5)

## 2020-12-28 LAB — LACTIC ACID, PLASMA: Lactic Acid, Venous: 0.8 mmol/L (ref 0.5–1.9)

## 2020-12-28 LAB — LDL CHOLESTEROL, DIRECT: Direct LDL: 108 mg/dL

## 2020-12-28 MED ORDER — LIDOCAINE-EPINEPHRINE 1 %-1:100000 IJ SOLN
10.0000 mL | Freq: Once | INTRAMUSCULAR | Status: AC
  Administered 2020-12-28: 10 mL
  Filled 2020-12-28: qty 1

## 2020-12-28 MED ORDER — ONDANSETRON HCL 4 MG/2ML IJ SOLN
4.0000 mg | Freq: Once | INTRAMUSCULAR | Status: AC
  Administered 2020-12-28: 4 mg via INTRAVENOUS
  Filled 2020-12-28: qty 2

## 2020-12-28 MED ORDER — SODIUM CHLORIDE 0.9 % IV SOLN: INTRAVENOUS | Status: DC | PRN

## 2020-12-28 MED ORDER — VANCOMYCIN HCL 2000 MG/400ML IV SOLN: 2000.0000 mg | Freq: Once | INTRAVENOUS | Status: DC

## 2020-12-28 MED ORDER — VANCOMYCIN HCL 1250 MG/250ML IV SOLN
1250.0000 mg | Freq: Three times a day (TID) | INTRAVENOUS | Status: DC
  Filled 2020-12-28: qty 250

## 2020-12-28 MED ORDER — VANCOMYCIN HCL IN DEXTROSE 1-5 GM/200ML-% IV SOLN
1000.0000 mg | Freq: Once | INTRAVENOUS | Status: AC
  Administered 2020-12-28: 1000 mg via INTRAVENOUS
  Filled 2020-12-28: qty 200

## 2020-12-28 MED ORDER — AMLODIPINE BESYLATE 5 MG PO TABS: ORAL_TABLET | ORAL | 0 refills | Status: DC

## 2020-12-28 MED ORDER — FENTANYL CITRATE (PF) 100 MCG/2ML IJ SOLN
50.0000 ug | Freq: Once | INTRAMUSCULAR | Status: AC
Start: 2020-12-28 — End: 2020-12-28
  Administered 2020-12-28: 50 ug via INTRAVENOUS
  Filled 2020-12-28: qty 2

## 2020-12-28 MED ORDER — DOXYCYCLINE HYCLATE 100 MG PO CAPS: 100.0000 mg | ORAL_CAPSULE | Freq: Two times a day (BID) | ORAL | 0 refills | Status: DC

## 2020-12-28 MED ORDER — HYDROCODONE-ACETAMINOPHEN 5-325 MG PO TABS: 2.0000 | ORAL_TABLET | Freq: Four times a day (QID) | ORAL | 0 refills | Status: DC | PRN

## 2020-12-28 MED ORDER — HYDROCODONE-ACETAMINOPHEN 5-325 MG PO TABS
2.0000 | ORAL_TABLET | Freq: Once | ORAL | Status: AC
  Administered 2020-12-28: 2 via ORAL
  Filled 2020-12-28: qty 2

## 2020-12-28 MED ORDER — IOHEXOL 300 MG/ML  SOLN
100.0000 mL | Freq: Once | INTRAMUSCULAR | Status: AC | PRN
  Administered 2020-12-28: 100 mL via INTRAVENOUS

## 2020-12-28 NOTE — ED Notes (Signed)
Patient transported to CT 

## 2020-12-28 NOTE — Progress Notes (Addendum)
Subjective:    Patient ID: Jackson Hatfield, male    DOB: 09-21-1970, 50 y.o.   MRN: [id number]  HPI  Pt in for wellness exam.  Pt truck driver.  He is walking daily for about 30 minutes. Has changes his diet as well. Cut out sugars. Last a1c 2 months ago 6.5. non smoker, and rare alcohol use. Drinks coke zero.   Pt needs colonoscopy.  Declines covid vaccine.   Pt has 3 days of red, swollen and tender area left hip. Area is increasingly painful. Pt felt feverish last night.  Pt has htn. When he girlfriend who is nurse checks will get 188-416 systolic at times.   Review of Systems  Constitutional:  Negative for chills, fatigue and fever.  Respiratory:  Negative for cough, chest tightness, shortness of breath and wheezing.   Cardiovascular:  Negative for chest pain and palpitations.  Gastrointestinal:  Negative for abdominal pain, blood in stool, diarrhea and rectal pain.  Musculoskeletal:  Negative for back pain, myalgias and neck stiffness.  Skin:  Negative for rash.  Neurological:  Negative for dizziness, syncope, weakness, numbness and headaches.  Hematological:  Negative for adenopathy. Does not bruise/bleed easily.  Psychiatric/Behavioral:  Negative for behavioral problems, confusion, dysphoric mood and suicidal ideas. The patient is not nervous/anxious and is not hyperactive.     Past Medical History:  Diagnosis Date   Diabetes mellitus without complication ([place])    Dyspnea    exerting self,ie: running   Hyperlipidemia    Hypertension    Pneumonia    Pre-diabetes    Sarcoidosis    Sleep apnea      Social History   Socioeconomic History   Marital status: Married    Spouse name: Not on file   Number of children: Not on file   Years of education: Not on file   Highest education level: Not on file  Occupational History   Not on file  Tobacco Use   Smoking status: Never   Smokeless tobacco: Never  Vaping Use   Vaping Use: Never used  Substance and Sexual  Activity   Alcohol use: Yes    Comment: ocassionally   Drug use: No   Sexual activity: Not on file  Other Topics Concern   Not on file  Social History Narrative   Not on file   Social Determinants of Health   Financial Resource Strain: Not on file  Food Insecurity: Not on file  Transportation Needs: Not on file  Physical Activity: Not on file  Stress: Not on file  Social Connections: Not on file  Intimate Partner Violence: Not on file    Past Surgical History:  Procedure Laterality Date   KNEE ARTHROSCOPY WITH MEDIAL MENISECTOMY Right 06/18/2018   Procedure: Right knee arthroscopy, partial medial menisectomy, debridement and drainage of cyst;  Surgeon: [name], MD;  Location: [facility];  Service: Orthopedics;  Laterality: Right;  60 mins   LOBECTOMY Left 2002   lower left  ( cyst on the lobe)in [facility] in [place], [place]    Family History  Problem Relation Age of Onset   COPD Mother    Alcohol abuse Father    Arthritis Father    Diabetes Father    Heart disease Father    Hyperlipidemia Father    Kidney disease Father    Diabetes Brother    Stroke Brother    Alcohol abuse Brother    Diabetes Brother    Drug abuse Brother  Allergies  Allergen Reactions   Naproxen Hives   Penicillins Hives    Current Outpatient Medications on File Prior to Visit  Medication Sig Dispense Refill   albuterol (ACCUNEB) 0.63 MG/3ML nebulizer solution Take 3 mLs (0.63 mg total) by nebulization every 6 (six) hours as needed for wheezing. 75 mL 5   albuterol (PROVENTIL HFA;VENTOLIN HFA) 108 (90 Base) MCG/ACT inhaler Inhale 2 puffs into the lungs every 4 (four) hours as needed for wheezing or shortness of breath. 1 Inhaler 2   aspirin EC 325 MG tablet Take 1 tablet (325 mg total) by mouth daily. 30 tablet 0   blood glucose meter kit and supplies Check blood sugar 2 times daily (E11.9). 1 each 0   clotrimazole (LOTRIMIN) 1 % cream Apply to affected area 2 times daily 15 g  0   famotidine (PEPCID) 20 MG tablet Take 1 tablet (20 mg total) by mouth daily. 30 tablet 0   hydrOXYzine (ATARAX/VISTARIL) 25 MG tablet Take 1 tablet (25 mg total) by mouth every 6 (six) hours. 12 tablet 0   lidocaine (XYLOCAINE) 2 % solution Use as directed 15 mLs in the mouth or throat as needed for mouth pain. 100 mL 0   losartan (COZAAR) 100 MG tablet TAKE 1 TABLET BY MOUTH EVERY DAY 60 tablet 0   metFORMIN (GLUCOPHAGE) 500 MG tablet Take 1 tablet (500 mg total) by mouth daily. 30 tablet 1   Miconazole Nitrate 2 % AERP Apply 1 application topically daily. 85 g 0   Multiple Vitamins-Minerals (MENS ONE DAILY PO) Take 1 tablet by mouth daily.      oseltamivir (TAMIFLU) 75 MG capsule Take 1 capsule (75 mg total) by mouth every 12 (twelve) hours. 10 capsule 0   tamsulosin (FLOMAX) 0.4 MG CAPS capsule Take 1 capsule (0.4 mg total) by mouth daily. 90 capsule 3   triamcinolone cream (KENALOG) 0.1 % Apply 1 application topically 2 (two) times daily. 30 g 0   No current facility-administered medications on file prior to visit.    BP (!) 142/80   Pulse (!) 50   Temp 99.1 F (37.3 C)   Ht $R'6\' 4"'NA$  (1.93 m)   Wt 293 lb 6.4 oz (133.1 kg)   SpO2 95%   BMI 35.71 kg/m       Objective:   Physical Exam  General Mental Status- Alert. General Appearance- Not in acute distress.   Skin General: Color- Normal Color. Moisture- Normal Moisture.  Neck Carotid Arteries- Normal color. Moisture- Normal Moisture. No carotid bruits. No JVD.  Chest and Lung Exam Auscultation: Breath Sounds:-Normal.  Cardiovascular Auscultation:Rythm- Regular. Murmurs & Other Heart Sounds:Auscultation of the heart reveals- No Murmurs.  Abdomen Inspection:-Inspeection Normal. Palpation/Percussion:Note:No mass. Palpation and Percussion of the abdomen reveal- Non Tender, Non Distended + BS, no rebound or guarding.   Neurologic Cranial Nerve exam:- CN III-XII intact(No nystagmus), symmetric smile. Drift Test:- No  drift. Romberg Exam:- Negative.  Heal to Toe Gait exam:-Normal. Finger to Nose:- Normal/Intact Strength:- 5/5 equal and symmetric strength both upper and lower extremities.   Left hip area- 9 cm x 9 cm red raised area that is warm and tender. Scab in center that has slight dc coming out of scab portion.    Assessment & Plan:  For you wellness exam today I have ordered cbc, cmp and lipid panel.  For diabetes get a1c today.   Psa today as well.  Refer to gi for colonoscopy.  Covid vaccine declined. For shingles vaccines check with  insurance to see if covered.   Recommend exercise and healthy diet.  We will let you know lab results as they come in.  Refer for wt loss management.  Follow up date appointment will be determined after lab review.     For moderate-large left hip area abscess placing referral to general surgeon. Since we are approaching weekend hoping surgeon will see you today for [place] and [place], [place] charge as well. Addressed obesity, placed referral wt loss, order a1c for diabetes and addressed abscess/placed referral(discuss plan over weekend going forward in event appt delayed). Answered questions.

## 2020-12-28 NOTE — Progress Notes (Signed)
Pharmacy Antibiotic Note  Jackson Hatfield is a 50 y.o. male admitted on 12/28/2020 with cellulitis on left hip.  Pharmacy has been consulted for vancomycin dosing.  Plan: TBW 133.1 kg, IBW 91.4, Scr 0.75, CrCl 149 ml/min, Ht 78 in, BMI 37.53  Loading: Vancomycin 2000 mg IV bolus Maintenance: Vancomycin 1250 mg IV q8h (eAUC 468.4)  Monitor renal function, cx results, clinical progression, and LOT  Temp (24hrs), Avg:99.1 F (37.3 C), Min:98.5 F (36.9 C), Max:99.6 F (37.6 C)  Recent Labs  Lab 12/28/20 1157 12/28/20 1540  WBC  --  14.4*  CREATININE 0.75 0.75  LATICACIDVEN  --  0.8     Allergies  Allergen Reactions   Naproxen Hives   Penicillins Hives    Antimicrobials this admission: 7/8 Vancomycin >>   Microbiology results: 7/8 BCx: Pending  Thank you for allowing pharmacy to be a part of this patient's care.  Marlowe Alt, PharmD Candidate 12/28/2020 6:55 PM

## 2020-12-28 NOTE — ED Triage Notes (Signed)
Abscess to his left hip x 3 days. He has used hot compresses with no improvement. He was seen by his MD this am and told to come here for I&D.

## 2020-12-28 NOTE — ED Notes (Signed)
ED Provider at bedside. 

## 2020-12-28 NOTE — Patient Instructions (Addendum)
For you wellness exam today I have ordered cbc, cmp and lipid panel.  For diabetes get a1c today.   Psa today as well.  Refer to gi for colonoscopy.  Covid vaccine declined. For shingles vaccines check with insurance to see if covered.   Recommend exercise and healthy diet.  We will let you know lab results as they come in.  Refer for wt loss management.  Follow up date appointment will be determined after lab review.     For moderate-large left hip area abscess placing referral to general surgeon. Since we are approaching weekend hoping surgeon will see you today for I and D.   We did contact general surgeon's office and they stated based on the size of the would not see patient and recommended emergency department evaluation now.  I will call downstairs to talk with ED staff to notify of presentation. MA had to call since I could not get thru  Preventive Care 21-46 Years Old, Male Preventive care refers to lifestyle choices and visits with your health care provider that can promote health and wellness. This includes: A yearly physical exam. This is also called an annual wellness visit. Regular dental and eye exams. Immunizations. Screening for certain conditions. Healthy lifestyle choices, such as: Eating a healthy diet. Getting regular exercise. Not using drugs or products that contain nicotine and tobacco. Limiting alcohol use. What can I expect for my preventive care visit? Physical exam Your health care provider will check your: Height and weight. These may be used to calculate your BMI (body mass index). BMI is a measurement that tells if you are at a healthy weight. Heart rate and blood pressure. Body temperature. Skin for abnormal spots. Counseling Your health care provider may ask you questions about your: Past medical problems. Family's medical history. Alcohol, tobacco, and drug use. Emotional well-being. Home life and relationship well-being. Sexual  activity. Diet, exercise, and sleep habits. Work and work Statistician. Access to firearms. What immunizations do I need?  Vaccines are usually given at various ages, according to a schedule. Your health care provider will recommend vaccines for you based on your age, medicalhistory, and lifestyle or other factors, such as travel or where you work. What tests do I need? Blood tests Lipid and cholesterol levels. These may be checked every 5 years, or more often if you are over 23 years old. Hepatitis C test. Hepatitis B test. Screening Lung cancer screening. You may have this screening every year starting at age 1 if you have a 30-pack-year history of smoking and currently smoke or have quit within the past 15 years. Prostate cancer screening. Recommendations will vary depending on your family history and other risks. Genital exam to check for testicular cancer or hernias. Colorectal cancer screening. All adults should have this screening starting at age 21 and continuing until age 24. Your health care provider may recommend screening at age 38 if you are at increased risk. You will have tests every 1-10 years, depending on your results and the type of screening test. Diabetes screening. This is done by checking your blood sugar (glucose) after you have not eaten for a while (fasting). You may have this done every 1-3 years. STD (sexually transmitted disease) testing, if you are at risk. Follow these instructions at home: Eating and drinking  Eat a diet that includes fresh fruits and vegetables, whole grains, lean protein, and low-fat dairy products. Take vitamin and mineral supplements as recommended by your health care provider. Do not  drink alcohol if your health care provider tells you not to drink. If you drink alcohol: Limit how much you have to 0-2 drinks a day. Be aware of how much alcohol is in your drink. In the [place] one drink equals one 12 oz bottle of beer (355 mL), one 5  oz glass of wine (148 mL), or one 1 oz glass of hard liquor (44 mL).  Lifestyle Take daily care of your teeth and gums. Brush your teeth every morning and night with fluoride toothpaste. Floss one time each day. Stay active. Exercise for at least 30 minutes 5 or more days each week. Do not use any products that contain nicotine or tobacco, such as cigarettes, e-cigarettes, and chewing tobacco. If you need help quitting, ask your health care provider. Do not use drugs. If you are sexually active, practice safe sex. Use a condom or other form of protection to prevent STIs (sexually transmitted infections). If told by your health care provider, take low-dose aspirin daily starting at age 39. Find healthy ways to cope with stress, such as: Meditation, yoga, or listening to music. Journaling. Talking to a trusted person. Spending time with friends and family. Safety Always wear your seat belt while driving or riding in a vehicle. Do not drive: If you have been drinking alcohol. Do not ride with someone who has been drinking. When you are tired or distracted. While texting. Wear a helmet and other protective equipment during sports activities. If you have firearms in your house, make sure you follow all gun safety procedures. What's next? Go to your health care provider once a year for an annual wellness visit. Ask your health care provider how often you should have your eyes and teeth checked. Stay up to date on all vaccines. This information is not intended to replace advice given to you by your health care provider. Make sure you discuss any questions you have with your healthcare provider. Document Revised: 03/08/2019 Document Reviewed: 06/03/2018 Elsevier Patient Education  2022 [employer].

## 2020-12-28 NOTE — Addendum Note (Signed)
Addended by: Kem Boroughs D on: 12/28/2020 12:25 PM   Modules accepted: Orders

## 2020-12-28 NOTE — ED Provider Notes (Signed)
[facility] EMERGENCY DEPARTMENT Provider Note   CSN: [id number] Arrival date & time: 12/28/20  1220     History Chief Complaint  Patient presents with   Abscess    Jackson Hatfield is a 50 y.o. male.  50yo M w/ PMH below including sarcoidosis, T2DM, HTN, HLD, OSA who p/w abscess. Pt reports skin infection on L hip that started 3 days ago and has been progressively worsening in size and pain. He has tried warm compresses and peroxide without improvement. He reports he may have had a fever last night, feels chilled currently. No vomiting or other areas of skin infection. No hx of the same. No preceding trauma or insect bite. BG has been well controlled lately. No meds PTA.  Of note, he often has wheezing due to sarcoid but denies any breathing problems or interference with usual physical activities.  The history is provided by the patient.  Abscess     Past Medical History:  Diagnosis Date   Diabetes mellitus without complication ([place])    Dyspnea    exerting self,ie: running   Hyperlipidemia    Hypertension    Pneumonia    Pre-diabetes    Sarcoidosis    Sleep apnea     Patient Active Problem List   Diagnosis Date Noted   Morbid obesity ([place]) 10/05/2017   Sarcoidosis 08/06/2017   OSA (obstructive sleep apnea) 08/06/2017    Past Surgical History:  Procedure Laterality Date   KNEE ARTHROSCOPY WITH MEDIAL MENISECTOMY Right 06/18/2018   Procedure: Right knee arthroscopy, partial medial menisectomy, debridement and drainage of cyst;  Surgeon: [name], MD;  Location: [facility];  Service: Orthopedics;  Laterality: Right;  60 mins   LOBECTOMY Left 2002   lower left  ( cyst on the lobe)in [facility] in [place], [place]       Family History  Problem Relation Age of Onset   COPD Mother    Alcohol abuse Father    Arthritis Father    Diabetes Father    Heart disease Father    Hyperlipidemia Father    Kidney disease Father    Diabetes Brother     Stroke Brother    Alcohol abuse Brother    Diabetes Brother    Drug abuse Brother     Social History   Tobacco Use   Smoking status: Never   Smokeless tobacco: Never  Vaping Use   Vaping Use: Never used  Substance Use Topics   Alcohol use: Yes    Comment: ocassionally   Drug use: No    Home Medications Prior to Admission medications   Medication Sig Start Date End Date Taking? Authorizing Provider  doxycycline (VIBRAMYCIN) 100 MG capsule Take 1 capsule (100 mg total) by mouth 2 (two) times daily. 12/28/20  Yes Paizleigh Wilds, Wenda Overland, MD  HYDROcodone-acetaminophen (NORCO/VICODIN) 5-325 MG tablet Take 2 tablets by mouth every 6 (six) hours as needed for severe pain. 12/28/20  Yes Cheick Suhr, Wenda Overland, MD  albuterol (ACCUNEB) 0.63 MG/3ML nebulizer solution Take 3 mLs (0.63 mg total) by nebulization every 6 (six) hours as needed for wheezing. 02/25/18   [name], [name], PA-C  albuterol (PROVENTIL HFA;VENTOLIN HFA) 108 (90 Base) MCG/ACT inhaler Inhale 2 puffs into the lungs every 4 (four) hours as needed for wheezing or shortness of breath. 02/25/18   [name], [name], PA-C  amLODipine (NORVASC) 5 MG tablet 1-2 tab po q day 12/28/20   [name], [name], PA-C  aspirin EC 325 MG tablet Take 1 tablet (  325 mg total) by mouth daily. 06/18/18   [name], MD  blood glucose meter kit and supplies Check blood sugar 2 times daily (E11.9). 08/17/18   [name], [name], PA-C  clotrimazole (LOTRIMIN) 1 % cream Apply to affected area 2 times daily 10/24/19   Hall-Potvin, [place], PA-C  famotidine (PEPCID) 20 MG tablet Take 1 tablet (20 mg total) by mouth daily. 12/23/18   [name], [name], PA-C  hydrOXYzine (ATARAX/VISTARIL) 25 MG tablet Take 1 tablet (25 mg total) by mouth every 6 (six) hours. 10/24/19   Hall-Potvin, [place], PA-C  lidocaine (XYLOCAINE) 2 % solution Use as directed 15 mLs in the mouth or throat as needed for mouth pain. 11/23/20   Hazel Sams, PA-C  losartan (COZAAR) 100 MG tablet TAKE 1 TABLET BY  MOUTH EVERY DAY 10/22/20   [name], [name], PA-C  metFORMIN (GLUCOPHAGE) 500 MG tablet Take 1 tablet (500 mg total) by mouth daily. 12/04/20   [name], [name], PA-C  Miconazole Nitrate 2 % AERP Apply 1 application topically daily. 10/24/19   Hall-Potvin, [place], PA-C  Multiple Vitamins-Minerals (MENS ONE DAILY PO) Take 1 tablet by mouth daily.     [provider]  oseltamivir (TAMIFLU) 75 MG capsule Take 1 capsule (75 mg total) by mouth every 12 (twelve) hours. 11/23/20   Hazel Sams, PA-C  tamsulosin (FLOMAX) 0.4 MG CAPS capsule Take 1 capsule (0.4 mg total) by mouth daily. 03/26/20   [name], [name], PA-C  triamcinolone cream (KENALOG) 0.1 % Apply 1 application topically 2 (two) times daily. 10/24/19   Hall-Potvin, [place], PA-C    Allergies    Naproxen and Penicillins  Review of Systems   Review of Systems All other systems reviewed and are negative except that which was mentioned in HPI  Physical Exam Updated Vital Signs BP 133/74 (BP Location: Right Arm)   Pulse 81   Temp 99.6 F (37.6 C) (Oral)   Resp (!) 23   Ht 6' 4" (1.93 m)   Wt 133.1 kg   SpO2 98%   BMI 35.72 kg/m   Physical Exam Constitutional:      General: He is not in acute distress.    Appearance: Normal appearance. He is obese.     Comments: Laying on right side  HENT:     Head: Normocephalic and atraumatic.  Eyes:     Conjunctiva/sclera: Conjunctivae normal.  Cardiovascular:     Rate and Rhythm: Normal rate and regular rhythm.     Heart sounds: Normal heart sounds. No murmur heard. Pulmonary:     Effort: Pulmonary effort is normal.     Breath sounds: Wheezing present.     Comments: Scattered wheezes R lung Abdominal:     General: Abdomen is flat. Bowel sounds are normal. There is no distension.     Palpations: Abdomen is soft.     Tenderness: There is no abdominal tenderness.  Musculoskeletal:     Right lower leg: No edema.     Left lower leg: No edema.  Skin:    General: Skin is warm and  dry.     Findings: Erythema present.     Comments: Large 9cm x 9cm area of induration, erythema, and central necrosis on L lateral proximal thigh, no crepitus  Neurological:     Mental Status: He is alert and oriented to person, place, and time.     Comments: fluent  Psychiatric:        Mood and Affect: Mood normal.  Behavior: Behavior normal.    ED Results / Procedures / Treatments   Labs (all labs ordered are listed, but only abnormal results are displayed) Labs Reviewed  BASIC METABOLIC PANEL - Abnormal; Notable for the following components:      Result Value   Glucose, Bld 103 (*)    All other components within normal limits  CBC WITH DIFFERENTIAL/PLATELET - Abnormal; Notable for the following components:   WBC 14.4 (*)    Neutro Abs 10.8 (*)    Monocytes Absolute 1.4 (*)    All other components within normal limits  CULTURE, BLOOD (ROUTINE X 2)  CULTURE, BLOOD (ROUTINE X 2)  LACTIC ACID, PLASMA    EKG None  Radiology CT HIP LEFT W CONTRAST  Result Date: 12/28/2020 CLINICAL DATA:  Diabetic patient with left hip pain for 3 days. No known injury. EXAM: CT OF THE LOWER LEFT EXTREMITY WITH CONTRAST TECHNIQUE: Multidetector CT imaging of the lower left extremity was performed according to the standard protocol following intravenous contrast administration. CONTRAST:  100 mL OMNIPAQUE IOHEXOL 300 MG/ML  SOLN COMPARISON:  None. FINDINGS: Bones/Joint/Cartilage No acute bony or joint abnormality is identified. Left hip joint space is preserved. Small synovial herniation pit at the head neck junction of the left hip is identified. No joint effusion. Ligaments Suboptimally assessed by CT. Muscles and Tendons No intramuscular fluid collection or gas is seen. No evidence of tear or strain. Soft tissues There is infiltration of subcutaneous fat lateral to the left hip and overlying skin is thickened. No soft tissue gas, radiopaque foreign body or focal fluid collection. IMPRESSION:  Findings most consistent with cellulitis lateral to the left hip. No abscess is identified. Negative for evidence of osteomyelitis, septic joint or myositis. Electronically Signed   By: Inge Rise M.D.   On: 12/28/2020 18:20    Procedures .Marland KitchenIncision and Drainage  Date/Time: 12/28/2020 8:11 PM Performed by: Sharlett Iles, MD Authorized by: Sharlett Iles, MD   Consent:    Consent obtained:  Verbal   Consent given by:  Patient   Risks discussed:  Bleeding and pain Universal protocol:    Patient identity confirmed:  Verbally with patient Location:    Type:  Abscess   Size:  3 cm   Location:  Lower extremity   Lower extremity location:  Hip   Hip location:  L hip Pre-procedure details:    Skin preparation:  Povidone-iodine Sedation:    Sedation type:  None Anesthesia:    Anesthesia method:  Local infiltration   Local anesthetic:  Lidocaine 1% WITH epi Procedure type:    Complexity:  Complex Procedure details:    Ultrasound guidance: yes     Incision types:  Single straight   Incision depth:  Dermal   Wound management:  Probed and deloculated   Drainage:  Purulent   Drainage amount:  Copious   Wound treatment:  Wound left open   Packing materials:  1/2 in iodoform gauze Post-procedure details:    Procedure completion:  Tolerated well, no immediate complications   Medications Ordered in ED Medications  vancomycin (VANCOREADY) IVPB 1250 mg/250 mL (has no administration in time range)  0.9 %  sodium chloride infusion (has no administration in time range)  vancomycin (VANCOCIN) IVPB 1000 mg/200 mL premix (1,000 mg Intravenous New Bag/Given 12/28/20 1930)    Followed by  vancomycin (VANCOCIN) IVPB 1000 mg/200 mL premix (1,000 mg Intravenous New Bag/Given 12/28/20 1931)  HYDROcodone-acetaminophen (NORCO/VICODIN) 5-325 MG per tablet 2 tablet (has  no administration in time range)  lidocaine-EPINEPHrine (XYLOCAINE W/EPI) 1 %-1:100000 (with pres) injection 10 mL (10  mLs Other Given 12/28/20 1512)  ondansetron (ZOFRAN) injection 4 mg (4 mg Intravenous Given 12/28/20 1551)  fentaNYL (SUBLIMAZE) injection 50 mcg (50 mcg Intravenous Given 12/28/20 1552)  iohexol (OMNIPAQUE) 300 MG/ML solution 100 mL (100 mLs Intravenous Contrast Given 12/28/20 1707)    ED Course  I have reviewed the triage vital signs and the nursing notes.  Pertinent labs & imaging results that were available during my care of the patient were reviewed by me and considered in my medical decision making (see chart for details).    MDM Rules/Calculators/A&P                          Afebrile, reassuring VS. Large infection on L thigh, concern for deep space infection or nec fasc therefore recommended labs and CT.   Lab work shows WBC 14.4, normal lactate.  CT shows cellulitis of soft tissue lateral to hip, no deep space infection, obvious abscess, or concerns for septic joint.  Bedside ultrasound does show some fluid collection and he has had some drainage on his clothes therefore recommended incision and drainage.  See procedure note.  Copious pus on I&D.  Gave dose of vancomycin here and will discharge on course of doxycycline.  I have discussed wound care including warm compresses and bacitracin.  Extensively reviewed return precautions and he voiced understanding. Final Clinical Impression(s) / ED Diagnoses Final diagnoses:  Cellulitis and abscess of left leg    Rx / DC Orders ED Discharge Orders          Ordered    doxycycline (VIBRAMYCIN) 100 MG capsule  2 times daily        12/28/20 2010    HYDROcodone-acetaminophen (NORCO/VICODIN) 5-325 MG tablet  Every 6 hours PRN        12/28/20 2010             Little, Wenda Overland, MD 12/28/20 2015

## 2020-12-30 ENCOUNTER — Other Ambulatory Visit: Payer: Self-pay | Admitting: Medical

## 2021-01-02 LAB — CULTURE, BLOOD (ROUTINE X 2)
Culture: NO GROWTH
Culture: NO GROWTH
Special Requests: ADEQUATE
Special Requests: ADEQUATE

## 2021-01-04 ENCOUNTER — Ambulatory Visit (INDEPENDENT_AMBULATORY_CARE_PROVIDER_SITE_OTHER): Payer: 59 | Admitting: Family Medicine

## 2021-01-04 ENCOUNTER — Encounter: Payer: Self-pay | Admitting: Family Medicine

## 2021-01-04 ENCOUNTER — Other Ambulatory Visit: Payer: Self-pay

## 2021-01-04 VITALS — BP 124/84 | HR 78 | Temp 98.4°F | Ht 76.0 in | Wt 289.2 lb

## 2021-01-04 DIAGNOSIS — S71002A Unspecified open wound, left hip, initial encounter: Secondary | ICD-10-CM | POA: Diagnosis not present

## 2021-01-04 NOTE — Patient Instructions (Signed)
When you do wash it, use only soap and water. Do not vigorously scrub. Apply triple antibiotic ointment (like Neosporin) twice daily. Keep the area clean and dry. No more peroxide.   Things to look out for: increasing pain not relieved by ibuprofen/acetaminophen, fevers, spreading redness, drainage of pus, or foul odor. Come back or see more urgent care.   Send me a message in 10 days if you are plateauing/not improving.   Let us know if you need anything.

## 2021-01-04 NOTE — Progress Notes (Signed)
Chief Complaint  Patient presents with   left hip wound is not healing    Jackson Hatfield is a 50 y.o. male here for a skin complaint. Here w wife.   Duration: 1 week Location: L hip Pruritic? No Painful? Yes Drainage? Yes New soaps/lotions/topicals/detergents? No Sick contacts? No Other associated symptoms: drained in ED 1 week ago; no fevers or spreading redness Therapies tried thus far: doxycycline  Past Medical History:  Diagnosis Date   Diabetes mellitus without complication ([facility])    Dyspnea    exerting self,ie: running   Hyperlipidemia    Hypertension    Pneumonia    Pre-diabetes    Sarcoidosis    Sleep apnea     BP 124/84   Pulse 78   Temp 98.4 F (36.9 C) (Oral)   Ht 6\' 4"  (1.93 m)   Wt 289 lb 4 oz (131.2 kg)   SpO2 96%   BMI 35.21 kg/m  Gen: awake, alert, appearing stated age Lungs: No accessory muscle use Skin: elliptical lesion measuring 4 cm x 2 cm on L lateral hip with granulation tissue and surround hyperemia. Slight warmth, mild ttp. No drainage, fluctuance, excoriation Psych: Age appropriate judgment and insight  Open wound of left hip, initial encounter  Cont care. This is slowly improving. Send message in 10 d if no better and will start referral process. TAO bid. Keep clean and dry. Warning signs and symptoms verbalized and written down in AVS.  F/u prn. The patient and his spouse voiced understanding and agreement to the plan.  [place], DO 01/04/21 3:45 PM

## 2021-02-18 ENCOUNTER — Other Ambulatory Visit: Payer: Self-pay | Admitting: Medical

## 2021-02-20 ENCOUNTER — Other Ambulatory Visit: Payer: Self-pay | Admitting: Medical

## 2021-03-02 ENCOUNTER — Encounter: Payer: Self-pay | Admitting: Emergency Medicine

## 2021-03-02 ENCOUNTER — Ambulatory Visit
Admission: EM | Admit: 2021-03-02 | Discharge: 2021-03-02 | Disposition: A | Discharge status: Home / Self Care | Payer: 59 | Attending: Emergency Medicine | Admitting: Emergency Medicine

## 2021-03-02 DIAGNOSIS — Z1152 Encounter for screening for COVID-19: Secondary | ICD-10-CM

## 2021-03-02 DIAGNOSIS — J209 Acute bronchitis, unspecified: Secondary | ICD-10-CM

## 2021-03-02 DIAGNOSIS — J069 Acute upper respiratory infection, unspecified: Secondary | ICD-10-CM

## 2021-03-02 MED ORDER — PREDNISONE 10 MG (21) PO TBPK: ORAL_TABLET | Freq: Every day | ORAL | 0 refills | Status: DC

## 2021-03-02 MED ORDER — ALBUTEROL SULFATE HFA 108 (90 BASE) MCG/ACT IN AERS
2.0000 | INHALATION_SPRAY | Freq: Once | RESPIRATORY_TRACT | Status: AC
  Administered 2021-03-02: 2 via RESPIRATORY_TRACT

## 2021-03-02 MED ORDER — BENZONATATE 100 MG PO CAPS: 100.0000 mg | ORAL_CAPSULE | Freq: Three times a day (TID) | ORAL | 0 refills | Status: DC

## 2021-03-02 MED ORDER — DEXAMETHASONE SODIUM PHOSPHATE 10 MG/ML IJ SOLN
10.0000 mg | Freq: Once | INTRAMUSCULAR | Status: AC
  Administered 2021-03-02: 10 mg via INTRAMUSCULAR

## 2021-03-02 MED ORDER — DOXYCYCLINE HYCLATE 100 MG PO CAPS: 100.0000 mg | ORAL_CAPSULE | Freq: Two times a day (BID) | ORAL | 0 refills | Status: DC

## 2021-03-02 NOTE — ED Provider Notes (Signed)
[place]   [id number] 03/02/21 Arrival Time: 1212   CC: COVID symptoms  SUBJECTIVE: History from: patient.  Jackson Hatfield is a 50 y.o. male who presents with nasal congestion, cough, and fever x 3 days.  Denies sick exposure to COVID, flu or strep.  Home covid test negative.  Has tried OTC medications without relief.  Symptoms are made worse with at night.  Reports previous symptoms in the past with flu.   Denies fever, SOB, wheezing, chest pain, nausea, changes in bowel or bladder habits.     ROS: As per HPI.  All other pertinent ROS negative.     Past Medical History:  Diagnosis Date   Diabetes mellitus without complication ([place])    Dyspnea    exerting self,ie: running   Hyperlipidemia    Hypertension    Pneumonia    Pre-diabetes    Sarcoidosis    Sleep apnea    Past Surgical History:  Procedure Laterality Date   KNEE ARTHROSCOPY WITH MEDIAL MENISECTOMY Right 06/18/2018   Procedure: Right knee arthroscopy, partial medial menisectomy, debridement and drainage of cyst;  Surgeon: [name], MD;  Location: [facility];  Service: Orthopedics;  Laterality: Right;  60 mins   LOBECTOMY Left 2002   lower left  ( cyst on the lobe)in [facility] in [place], [place]   Allergies  Allergen Reactions   Naproxen Hives   Penicillins Hives   No current facility-administered medications on file prior to encounter.   Current Outpatient Medications on File Prior to Encounter  Medication Sig Dispense Refill   albuterol (PROVENTIL HFA;VENTOLIN HFA) 108 (90 Base) MCG/ACT inhaler Inhale 2 puffs into the lungs every 4 (four) hours as needed for wheezing or shortness of breath. 1 Inhaler 2   blood glucose meter kit and supplies Check blood sugar 2 times daily (E11.9). 1 each 0   felodipine (PLENDIL) 5 MG 24 hr tablet Take 1 tablet (5 mg total) by mouth daily. 90 tablet 1   HYDROcodone-acetaminophen (NORCO/VICODIN) 5-325 MG tablet Take 2 tablets by mouth every 6 (six) hours  as needed for severe pain. 10 tablet 0   lidocaine (XYLOCAINE) 2 % solution Use as directed 15 mLs in the mouth or throat as needed for mouth pain. 100 mL 0   losartan (COZAAR) 100 MG tablet TAKE 1 TABLET BY MOUTH EVERY DAY 90 tablet 1   metFORMIN (GLUCOPHAGE) 500 MG tablet TAKE 1 TABLET (500 MG TOTAL) BY MOUTH DAILY. 90 tablet 1   Multiple Vitamins-Minerals (MENS ONE DAILY PO) Take 1 tablet by mouth daily.      oseltamivir (TAMIFLU) 75 MG capsule Take 1 capsule (75 mg total) by mouth every 12 (twelve) hours. 10 capsule 0   tamsulosin (FLOMAX) 0.4 MG CAPS capsule Take 1 capsule (0.4 mg total) by mouth daily. 90 capsule 3   Social History   Socioeconomic History   Marital status: Married    Spouse name: Not on file   Number of children: Not on file   Years of education: Not on file   Highest education level: Not on file  Occupational History   Not on file  Tobacco Use   Smoking status: Never   Smokeless tobacco: Never  Vaping Use   Vaping Use: Never used  Substance and Sexual Activity   Alcohol use: Yes    Comment: ocassionally   Drug use: No   Sexual activity: Not on file  Other Topics Concern   Not on file  Social History Narrative  Not on file   Social Determinants of Health   Financial Resource Strain: Not on file  Food Insecurity: Not on file  Transportation Needs: Not on file  Physical Activity: Not on file  Stress: Not on file  Social Connections: Not on file  Intimate Partner Violence: Not on file   Family History  Problem Relation Age of Onset   COPD Mother    Alcohol abuse Father    Arthritis Father    Diabetes Father    Heart disease Father    Hyperlipidemia Father    Kidney disease Father    Diabetes Brother    Stroke Brother    Alcohol abuse Brother    Diabetes Brother    Drug abuse Brother     OBJECTIVE:  Vitals:   03/02/21 1253  BP: (!) 150/82  Pulse: 82  Resp: 20  Temp: 99.3 F (37.4 C)  TempSrc: Oral  SpO2: 95%     General  appearance: alert; appears fatigued, but nontoxic; speaking in full sentences and tolerating own secretions HEENT: NCAT; Ears: EACs clear, TMs pearly gray; Eyes: PERRL.  EOM grossly intact. Sinuses: nontender; Nose: nares patent without rhinorrhea, Throat: oropharynx clear, tonsils non erythematous or enlarged, uvula midline  Neck: supple without LAD Lungs: unlabored respirations, symmetrical air entry; cough: mild; no respiratory distress; diffuse wheezes throughout Heart: regular rate and rhythm.   Skin: warm and dry Psychological: alert and cooperative; normal mood and affect  ASSESSMENT & PLAN:  1. Encounter for screening for COVID-19   2. Viral URI with cough   3. Acute bronchitis, unspecified organism     Meds ordered this encounter  Medications   benzonatate (TESSALON) 100 MG capsule    Sig: Take 1 capsule (100 mg total) by mouth every 8 (eight) hours.    Dispense:  21 capsule    Refill:  0    Order Specific Question:   Supervising Provider    Answer:   Raylene Everts [[id number]]   dexamethasone (DECADRON) injection 10 mg   predniSONE (STERAPRED UNI-PAK 21 TAB) 10 MG (21) TBPK tablet    Sig: Take by mouth daily. Take 6 tabs by mouth daily  for 2 days, then 5 tabs for 2 days, then 4 tabs for 2 days, then 3 tabs for 2 days, 2 tabs for 2 days, then 1 tab by mouth daily for 2 days    Dispense:  42 tablet    Refill:  0    Order Specific Question:   Supervising Provider    Answer:   Raylene Everts [[id number]]   doxycycline (VIBRAMYCIN) 100 MG capsule    Sig: Take 1 capsule (100 mg total) by mouth 2 (two) times daily.    Dispense:  20 capsule    Refill:  0    Order Specific Question:   Supervising Provider    Answer:   Raylene Everts [[id number]]    COVID testing ordered.  It will take between 5-7 days for test results.  Someone will contact you regarding abnormal results.    In the meantime: You should remain isolated in your home for 5 days from symptom onset AND  greater than 72 hours after symptoms resolution (absence of fever without the use of fever-reducing medication and improvement in respiratory symptoms), whichever is longer Get plenty of rest and push fluids Steroid shot given Tessalon Perles prescribed for cough Prednisone prescribed Doxycycline prescribed Please fill medication and start taking if not having improvement in symptoms over the  next few days.   Use OTC zyrtec for nasal congestion, runny nose, and/or sore throat Use OTC flonase for nasal congestion and runny nose Use medications daily for symptom relief Use OTC medications like ibuprofen or tylenol as needed fever or pain Call or go to the ED if you have any new or worsening symptoms such as fever, worsening cough, shortness of breath, chest tightness, chest pain, turning blue, changes in mental status, etc...   Reviewed expectations re: course of current medical issues. Questions answered. Outlined signs and symptoms indicating need for more acute intervention. Patient verbalized understanding. After Visit Summary given.          Lestine Box, PA-C 03/02/21 1321

## 2021-03-02 NOTE — Discharge Instructions (Signed)

## 2021-03-02 NOTE — ED Triage Notes (Signed)
Pt presents today with nasal congestion, cough and fever x 3 days. Home Covid test neg today.

## 2021-03-03 LAB — COVID-19, FLU A+B NAA
Influenza A, NAA: NOT DETECTED
Influenza B, NAA: NOT DETECTED
SARS-CoV-2, NAA: NOT DETECTED

## 2021-03-04 ENCOUNTER — Ambulatory Visit (AMBULATORY_SURGERY_CENTER): Payer: 59 | Admitting: *Deleted

## 2021-03-04 ENCOUNTER — Other Ambulatory Visit: Payer: Self-pay

## 2021-03-04 VITALS — Ht 74.0 in | Wt 286.0 lb

## 2021-03-04 DIAGNOSIS — Z1211 Encounter for screening for malignant neoplasm of colon: Secondary | ICD-10-CM

## 2021-03-04 MED ORDER — NA SULFATE-K SULFATE-MG SULF 17.5-3.13-1.6 GM/177ML PO SOLN: 1.0000 | Freq: Once | ORAL | 0 refills | Status: AC

## 2021-03-04 NOTE — Progress Notes (Signed)
No egg or soy allergy known to patient  No issues known to pt with past sedation with any surgeries or procedures Patient denies ever being told they had issues or difficulty with intubation  No FH of Malignant Hyperthermia Pt is not on diet pills Pt is not on  home 02  Pt is not on blood thinners  Pt denies issues with constipation - in the last week some issues but prior to recent illness no issues- daily soft bm's  No A fib or A flutter  EMMI video to pt or via MyChart   Pt is not vaccinated  for Covid - had covid 10-2020  Due to the COVID-19 pandemic we are asking patients to follow certain guidelines.  Pt aware of COVID protocols and [facility] guidelines  Pt verified name, DOB, address and insurance during PV today.  Pt mailed instruction packet of Emmi video, copy of consent form to read and not return, and instructions.  PV completed over the phone.  Pt encouraged to call with questions or issues.  My Chart instructions to pt as well    Pt verified name, DOB, address and insurance during PV today.  Pt mailed instruction packet of Emmi video, copy of consent form to read and not return, and instructions.  PV completed over the phone.  Pt encouraged to call with questions or issues.  My Chart instructions to pt as well

## 2021-03-05 ENCOUNTER — Encounter: Payer: Self-pay | Admitting: Gastroenterology

## 2021-03-18 ENCOUNTER — Ambulatory Visit (AMBULATORY_SURGERY_CENTER): Payer: 59 | Admitting: Gastroenterology

## 2021-03-18 ENCOUNTER — Encounter: Payer: Self-pay | Admitting: Gastroenterology

## 2021-03-18 ENCOUNTER — Other Ambulatory Visit: Payer: Self-pay

## 2021-03-18 VITALS — BP 121/63 | HR 51 | Temp 98.6°F | Resp 12 | Ht 74.0 in | Wt 286.0 lb

## 2021-03-18 DIAGNOSIS — Z1211 Encounter for screening for malignant neoplasm of colon: Secondary | ICD-10-CM | POA: Diagnosis not present

## 2021-03-18 DIAGNOSIS — D125 Benign neoplasm of sigmoid colon: Secondary | ICD-10-CM | POA: Diagnosis not present

## 2021-03-18 DIAGNOSIS — K635 Polyp of colon: Secondary | ICD-10-CM | POA: Diagnosis not present

## 2021-03-18 MED ORDER — SODIUM CHLORIDE 0.9 % IV SOLN: 500.0000 mL | Freq: Once | INTRAVENOUS | Status: DC

## 2021-03-18 NOTE — Progress Notes (Signed)
Pt's states no medical or surgical changes since previsit or office visit. VS assessed by C.W 

## 2021-03-18 NOTE — Progress Notes (Signed)
Report given to PACU, vss 

## 2021-03-18 NOTE — Patient Instructions (Signed)
Information on polyps and hemorrhoids given to you today.  Await pathology results.  Resume previous diet and medications.  YOU HAD AN ENDOSCOPIC PROCEDURE TODAY AT [facility]:   Refer to the procedure report that was given to you for any specific questions about what was found during the examination.  If the procedure report does not answer your questions, please call your gastroenterologist to clarify.  If you requested that your care partner not be given the details of your procedure findings, then the procedure report has been included in a sealed envelope for you to review at your convenience later.  YOU SHOULD EXPECT: Some feelings of bloating in the abdomen. Passage of more gas than usual.  Walking can help get rid of the air that was put into your GI tract during the procedure and reduce the bloating. If you had a lower endoscopy (such as a colonoscopy or flexible sigmoidoscopy) you may notice spotting of blood in your stool or on the toilet paper. If you underwent a bowel prep for your procedure, you may not have a normal bowel movement for a few days.  Please Note:  You might notice some irritation and congestion in your nose or some drainage.  This is from the oxygen used during your procedure.  There is no need for concern and it should clear up in a day or so.  SYMPTOMS TO REPORT IMMEDIATELY:  Following lower endoscopy (colonoscopy or flexible sigmoidoscopy):  Excessive amounts of blood in the stool  Significant tenderness or worsening of abdominal pains  Swelling of the abdomen that is new, acute  Fever of 100F or higher  For urgent or emergent issues, a gastroenterologist can be reached at any hour by calling [phone]. Do not use MyChart messaging for urgent concerns.    DIET:  We do recommend a small meal at first, but then you may proceed to your regular diet.  Drink plenty of fluids but you should avoid alcoholic beverages for 24 hours.  ACTIVITY:   You should plan to take it easy for the rest of today and you should NOT DRIVE or use heavy machinery until tomorrow (because of the sedation medicines used during the test).    FOLLOW UP: Our staff will call the number listed on your records 48-72 hours following your procedure to check on you and address any questions or concerns that you may have regarding the information given to you following your procedure. If we do not reach you, we will leave a message.  We will attempt to reach you two times.  During this call, we will ask if you have developed any symptoms of COVID 19. If you develop any symptoms (ie: fever, flu-like symptoms, shortness of breath, cough etc.) before then, please call [phone].  If you test positive for Covid 19 in the 2 weeks post procedure, please call and report this information to [place].    If any biopsies were taken you will be contacted by phone or by letter within the next 1-3 weeks.  Please call us at [phone] if you have not heard about the biopsies in 3 weeks.    SIGNATURES/CONFIDENTIALITY: You and/or your care partner have signed paperwork which will be entered into your electronic medical record.  These signatures attest to the fact that that the information above on your After Visit Summary has been reviewed and is understood.  Full responsibility of the confidentiality of this discharge information lies with you and/or your care-partner.

## 2021-03-18 NOTE — Progress Notes (Signed)
Called to room to assist during endoscopic procedure.  Patient ID and intended procedure confirmed with present staff. Received instructions for my participation in the procedure from the performing physician.  

## 2021-03-18 NOTE — Op Note (Signed)
[facility] Patient Name: Jackson Hatfield Procedure Date: 03/18/2021 10:14 AM MRN: [id number] Endoscopist: Ladene Artist , MD Age: 50 Referring MD:  Date of Birth: [birth year] Gender: Male Account #: [id number] Procedure:                Colonoscopy Indications:              Screening for colorectal malignant neoplasm Medicines:                Monitored Anesthesia Care Procedure:                Pre-Anesthesia Assessment:                           - Prior to the procedure, a History and Physical                            was performed, and patient medications and                            allergies were reviewed. The patient's tolerance of                            previous anesthesia was also reviewed. The risks                            and benefits of the procedure and the sedation                            options and risks were discussed with the patient.                            All questions were answered, and informed consent                            was obtained. Prior Anticoagulants: The patient has                            taken no previous anticoagulant or antiplatelet                            agents. ASA Grade Assessment: II - A patient with                            mild systemic disease. After reviewing the risks                            and benefits, the patient was deemed in                            satisfactory condition to undergo the procedure.                           After obtaining informed consent, the colonoscope  was passed under direct vision. Throughout the                            procedure, the patient's blood pressure, pulse, and                            oxygen saturations were monitored continuously. The                            Olympus CF-HQ190L ([id number]) Colonoscope was                            introduced through the anus and advanced to the the                            cecum, identified  by appendiceal orifice and                            ileocecal valve. The ileocecal valve, appendiceal                            orifice, and rectum were photographed. The quality                            of the bowel preparation was good. The colonoscopy                            was performed without difficulty. The patient                            tolerated the procedure well. Scope In: 10:30:31 AM Scope Out: 10:46:28 AM Scope Withdrawal Time: 0 hours 13 minutes 45 seconds  Total Procedure Duration: 0 hours 15 minutes 57 seconds  Findings:                 The perianal and digital rectal examinations were                            normal.                           Two sessile polyps were found in the sigmoid colon.                            The polyps were 7 to 8 mm in size. These polyps                            were removed with a cold snare. Resection and                            retrieval were complete.                           Internal hemorrhoids were found during  retroflexion. The hemorrhoids were moderate and                            Grade I (internal hemorrhoids that do not prolapse).                           The exam was otherwise without abnormality on                            direct and retroflexion views. Complications:            No immediate complications. Estimated blood loss:                            None. Estimated Blood Loss:     Estimated blood loss: none. Impression:               - Two 7 to 8 mm polyps in the sigmoid colon,                            removed with a cold snare. Resected and retrieved.                           - Internal hemorrhoids.                           - The examination was otherwise normal on direct                            and retroflexion views. Recommendation:           - Repeat colonoscopy after studies are complete for                            surveillance based on pathology  results.                           - Patient has a contact number available for                            emergencies. The signs and symptoms of potential                            delayed complications were discussed with the                            patient. Return to normal activities tomorrow.                            Written discharge instructions were provided to the                            patient.                           - Resume previous diet.                           -  Continue present medications.                           - Await pathology results. Ladene Artist, MD 03/18/2021 10:49:16 AM This report has been signed electronically.

## 2021-03-18 NOTE — Progress Notes (Signed)
See 03/02/2021 H&P, URI resolved, otherwise no changes. This patient is appropriate for endoscopic procedures in the ambulatory setting.

## 2021-03-20 ENCOUNTER — Telehealth: Payer: Self-pay

## 2021-03-20 NOTE — Telephone Encounter (Signed)
  Follow up Call-  Call back number 03/18/2021  Post procedure Call Back phone  # [phone]  Permission to leave phone message No  Some recent data might be hidden     Patient questions:  Do you have a fever, pain , or abdominal swelling? No. Pain Score  0 *  Have you tolerated food without any problems? Yes.    Have you been able to return to your normal activities? Yes.    Do you have any questions about your discharge instructions: Diet   No. Medications  No. Follow up visit  No.  Do you have questions or concerns about your Care? No.  Actions: * If pain score is 4 or above: No action needed, pain <4.   Have you developed a fever since your procedure? no  2.   Have you had an respiratory symptoms (SOB or cough) since your procedure? no  3.   Have you tested positive for COVID 19 since your procedure no  4.   Have you had any family members/close contacts diagnosed with the COVID 19 since your procedure?  no   If yes to any of these questions please route to Joylene John, RN and Joella Prince, RN

## 2021-03-31 ENCOUNTER — Other Ambulatory Visit: Payer: Self-pay

## 2021-03-31 ENCOUNTER — Encounter (HOSPITAL_BASED_OUTPATIENT_CLINIC_OR_DEPARTMENT_OTHER): Payer: Self-pay | Admitting: Emergency Medicine

## 2021-03-31 ENCOUNTER — Emergency Department (HOSPITAL_BASED_OUTPATIENT_CLINIC_OR_DEPARTMENT_OTHER): Payer: 59

## 2021-03-31 ENCOUNTER — Emergency Department (HOSPITAL_BASED_OUTPATIENT_CLINIC_OR_DEPARTMENT_OTHER)
Admission: EM | Admit: 2021-03-31 | Discharge: 2021-03-31 | Disposition: A | Discharge status: Home / Self Care | Payer: 59 | Attending: Emergency Medicine | Admitting: Emergency Medicine

## 2021-03-31 DIAGNOSIS — E119 Type 2 diabetes mellitus without complications: Secondary | ICD-10-CM | POA: Insufficient documentation

## 2021-03-31 DIAGNOSIS — I10 Essential (primary) hypertension: Secondary | ICD-10-CM | POA: Insufficient documentation

## 2021-03-31 DIAGNOSIS — Z20822 Contact with and (suspected) exposure to covid-19: Secondary | ICD-10-CM | POA: Diagnosis not present

## 2021-03-31 DIAGNOSIS — R0789 Other chest pain: Secondary | ICD-10-CM | POA: Insufficient documentation

## 2021-03-31 DIAGNOSIS — Z7984 Long term (current) use of oral hypoglycemic drugs: Secondary | ICD-10-CM | POA: Diagnosis not present

## 2021-03-31 DIAGNOSIS — R0602 Shortness of breath: Secondary | ICD-10-CM | POA: Insufficient documentation

## 2021-03-31 DIAGNOSIS — R509 Fever, unspecified: Secondary | ICD-10-CM | POA: Insufficient documentation

## 2021-03-31 DIAGNOSIS — Z79899 Other long term (current) drug therapy: Secondary | ICD-10-CM | POA: Insufficient documentation

## 2021-03-31 LAB — URINALYSIS, ROUTINE W REFLEX MICROSCOPIC
Bilirubin Urine: NEGATIVE
Glucose, UA: NEGATIVE mg/dL
Hgb urine dipstick: NEGATIVE
Ketones, ur: NEGATIVE mg/dL
Leukocytes,Ua: NEGATIVE
Nitrite: NEGATIVE
Protein, ur: NEGATIVE mg/dL
Specific Gravity, Urine: 1.01 (ref 1.005–1.030)
pH: 6 (ref 5.0–8.0)

## 2021-03-31 LAB — CBC WITH DIFFERENTIAL/PLATELET
Abs Immature Granulocytes: 0.03 10*3/uL (ref 0.00–0.07)
Basophils Absolute: 0 10*3/uL (ref 0.0–0.1)
Basophils Relative: 1 %
Eosinophils Absolute: 0.3 10*3/uL (ref 0.0–0.5)
Eosinophils Relative: 4 %
HCT: 37.8 % — ABNORMAL LOW (ref 39.0–52.0)
Hemoglobin: 12.8 g/dL — ABNORMAL LOW (ref 13.0–17.0)
Immature Granulocytes: 0 %
Lymphocytes Relative: 19 %
Lymphs Abs: 1.5 10*3/uL (ref 0.7–4.0)
MCH: 29 pg (ref 26.0–34.0)
MCHC: 33.9 g/dL (ref 30.0–36.0)
MCV: 85.7 fL (ref 80.0–100.0)
Monocytes Absolute: 0.8 10*3/uL (ref 0.1–1.0)
Monocytes Relative: 9 %
Neutro Abs: 5.3 10*3/uL (ref 1.7–7.7)
Neutrophils Relative %: 67 %
Platelets: 318 10*3/uL (ref 150–400)
RBC: 4.41 MIL/uL (ref 4.22–5.81)
RDW: 13.2 % (ref 11.5–15.5)
WBC: 8 10*3/uL (ref 4.0–10.5)
nRBC: 0 % (ref 0.0–0.2)

## 2021-03-31 LAB — RESP PANEL BY RT-PCR (FLU A&B, COVID) ARPGX2
Influenza A by PCR: NEGATIVE
Influenza B by PCR: NEGATIVE
SARS Coronavirus 2 by RT PCR: NEGATIVE

## 2021-03-31 LAB — BASIC METABOLIC PANEL
Anion gap: 9 (ref 5–15)
BUN: 12 mg/dL (ref 6–20)
CO2: 26 mmol/L (ref 22–32)
Calcium: 9.2 mg/dL (ref 8.9–10.3)
Chloride: 102 mmol/L (ref 98–111)
Creatinine, Ser: 0.67 mg/dL (ref 0.61–1.24)
GFR, Estimated: >60 mL/min (ref >60)
Glucose, Bld: 124 mg/dL — ABNORMAL HIGH (ref 70–99)
Potassium: 4.2 mmol/L (ref 3.5–5.1)
Sodium: 137 mmol/L (ref 135–145)

## 2021-03-31 LAB — BRAIN NATRIURETIC PEPTIDE: B Natriuretic Peptide: 78.4 pg/mL (ref 0.0–100.0)

## 2021-03-31 LAB — LACTIC ACID, PLASMA: Lactic Acid, Venous: 1.3 mmol/L (ref 0.5–1.9)

## 2021-03-31 LAB — D-DIMER, QUANTITATIVE: D-Dimer, Quant: 0.59 ug/mL-FEU — ABNORMAL HIGH (ref 0.00–0.50)

## 2021-03-31 MED ORDER — IOHEXOL 350 MG/ML SOLN
100.0000 mL | Freq: Once | INTRAVENOUS | Status: AC | PRN
  Administered 2021-03-31: 100 mL via INTRAVENOUS

## 2021-03-31 NOTE — Discharge Instructions (Addendum)
You were seen evaluated in the emergency department today for further evaluation of shortness of breath.  As we discussed, we found some contributing opacities on your CT scan.  This is likely a chronic finding however you need to get established with a pulmonologist for further evaluation.  Please return to the emergency department for experience worsening shortness of breath, loss of consciousness, severe fevers, or other concerns you may have.

## 2021-03-31 NOTE — ED Triage Notes (Signed)
Pt c/o shortness of breath increased last couple months, worse at night. Fever 102.0.

## 2021-03-31 NOTE — ED Notes (Signed)
RT at bedside to assess patient

## 2021-03-31 NOTE — ED Provider Notes (Signed)
[facility] EMERGENCY DEPARTMENT Provider Note   CSN: [id number] Arrival date & time: 03/31/21  1024     History Chief Complaint  Patient presents with   Shortness of Breath    Jackson Hatfield is a 50 y.o. 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  He states that he is always been chronically short of breath at baseline since he was a child however has been acutely worse over the last couple of months.  Shortness of breath is worse at night.  He does report associated intermittent chest heaviness however he has chest pain-free right now.  Nothing seems to make his shortness of breath better.  His fever is improved with Tylenol.  He denies any cough, congestion, sore throat, abdominal pain, nausea, vomiting, diarrhea, rectal bleeding, rectal pain, urinary symptoms, leg pain, leg swelling.  The history is provided by the patient and a significant other. No language interpreter was used.  Shortness of Breath     Past Medical History:  Diagnosis Date   Blood transfusion without reported diagnosis    many years ago   Diabetes mellitus without complication ([place])    Dyspnea    exerting self,ie: running   GERD (gastroesophageal reflux disease)    ? medicine related   Hyperlipidemia    Hypertension    Pneumonia    Pre-diabetes    Sarcoidosis    Sleep apnea    wears cpap    Patient Active Problem List   Diagnosis Date Noted   Morbid obesity ([place]) 10/05/2017   Sarcoidosis 08/06/2017   OSA (obstructive sleep apnea) 08/06/2017    Past Surgical History:  Procedure Laterality Date   KNEE ARTHROSCOPY WITH MEDIAL MENISECTOMY Right 06/18/2018   Procedure: Right knee arthroscopy, partial medial menisectomy, debridement and drainage of cyst;  Surgeon: [name], MD;   Location: [facility];  Service: Orthopedics;  Laterality: Right;  60 mins   LOBECTOMY Left 2002   lower left  ( cyst on the lobe)in [facility] in [place], [place]       Family History  Problem Relation Age of Onset   COPD Mother    Alcohol abuse Father    Arthritis Father    Diabetes Father    Heart disease Father    Hyperlipidemia Father    Kidney disease Father    Diabetes Brother    Stroke Brother    Alcohol abuse Brother    Diabetes Brother    Drug abuse Brother    Stomach cancer Paternal Uncle    Colon cancer Neg Hx    Colon polyps Neg Hx    Esophageal cancer Neg Hx    Rectal cancer Neg Hx     Social History   Tobacco Use   Smoking status: Never   Smokeless tobacco: Never  Vaping Use   Vaping Use: Never used  Substance Use Topics   Alcohol use: Yes    Comment: ocassionally   Drug use: No    Home Medications Prior to Admission medications   Medication Sig Start Date End Date Taking? Authorizing Provider  albuterol (PROVENTIL HFA;VENTOLIN HFA) 108 (90 Base) MCG/ACT inhaler Inhale 2 puffs into the lungs every 4 (four) hours as needed for wheezing or shortness of breath. 02/25/18   [name], [name], PA-C  blood glucose meter  kit and supplies Check blood sugar 2 times daily (E11.9). 08/17/18   [name], [name], PA-C  losartan (COZAAR) 100 MG tablet TAKE 1 TABLET BY MOUTH EVERY DAY 12/31/20   [name], [name], PA-C  metFORMIN (GLUCOPHAGE) 500 MG tablet TAKE 1 TABLET (500 MG TOTAL) BY MOUTH DAILY. 12/31/20   [name], [name], PA-C  Multiple Vitamins-Minerals (MENS ONE DAILY PO) Take 1 tablet by mouth daily.     [provider]  tamsulosin (FLOMAX) 0.4 MG CAPS capsule Take 1 capsule (0.4 mg total) by mouth daily. 03/26/20   [name], [name], PA-C    Allergies    Naproxen and Penicillins  Review of Systems   Review of Systems  Respiratory:  Positive for shortness of breath.   All other systems reviewed and are negative.  Physical Exam Updated Vital Signs BP  125/70   Pulse (!) 53   Temp 98.1 F (36.7 C) (Oral)   Resp 15   Ht _0  (1.88 m)   Wt 130.2 kg   SpO2 98%   BMI 36.85 kg/m   Physical Exam Constitutional:      General: He is not in acute distress.    Appearance: Normal appearance.  HENT:     Head: Normocephalic and atraumatic.  Eyes:     General:        Right eye: No discharge.        Left eye: No discharge.  Cardiovascular:     Comments: Regular rate and rhythm.  S1/S2 are distinct without any evidence of murmur, rubs, or gallops.  Radial pulses are 2+ bilaterally.  Dorsalis pedis pulses are 2+ bilaterally.  No evidence of pedal edema. Pulmonary:     Comments: Clear to auscultation bilaterally.  Normal effort.  No respiratory distress.  No evidence of wheezes, rales, or rhonchi heard throughout. Abdominal:     General: Abdomen is flat. Bowel sounds are normal. There is no distension.     Tenderness: There is no abdominal tenderness. There is no guarding or rebound.  Musculoskeletal:        General: Normal range of motion.     Cervical back: Neck supple.  Skin:    General: Skin is warm and dry.     Findings: No rash.  Neurological:     General: No focal deficit present.     Mental Status: He is alert.  Psychiatric:        Mood and Affect: Mood normal.        Behavior: Behavior normal.    ED Results / Procedures / Treatments   Labs (all labs ordered are listed, but only abnormal results are displayed) Labs Reviewed  CBC WITH DIFFERENTIAL/PLATELET - Abnormal; Notable for the following components:      Result Value   Hemoglobin 12.8 (*)    HCT 37.8 (*)    All other components within normal limits  BASIC METABOLIC PANEL - Abnormal; Notable for the following components:   Glucose, Bld 124 (*)    All other components within normal limits  D-DIMER, QUANTITATIVE - Abnormal; Notable for the following components:   D-Dimer, Quant 0.59 (*)    All other components within normal limits  RESP PANEL BY RT-PCR (FLU A&B,  COVID) ARPGX2  CULTURE, BLOOD (ROUTINE X 2)  CULTURE, BLOOD (ROUTINE X 2)  BRAIN NATRIURETIC PEPTIDE  LACTIC ACID, PLASMA  URINALYSIS, ROUTINE W REFLEX MICROSCOPIC    EKG None  Radiology DG Chest 2 View  Result Date: 03/31/2021 CLINICAL DATA:  50 year old male with  a history shortness of breath. Fever EXAM: CHEST - 2 VIEW COMPARISON:  08/06/2017, 11/23/2020 FINDINGS: Cardiomediastinal silhouette unchanged in size and contour. The left heart border is not well visualized, which is a change from the comparison. No interlobular septal thickening.  Coarsened interstitial markings. Blunting of the left costophrenic angle on the frontal view with decreased clarity of the left hemidiaphragm compared to the prior. No acute displaced fracture. Degenerative changes of the spine. IMPRESSION: Decreased clarity of the left hemidiaphragm and the left heart border, concerning for lingular airspace disease/pneumonia. At least a follow-up PA and lateral chest X-ray is recommended in 3-4 weeks following therapy to assure resolution, if not follow-up CT. Electronically Signed   By: Corrie Mckusick D.O.   On: 03/31/2021 11:47   CT Angio Chest PE W and/or Wo Contrast  Result Date: 03/31/2021 CLINICAL DATA:  Shortness of breath increased in past several months, fever 102 degrees, D-dimer 0.59, diabetes mellitus, hypertension, sarcoidosis EXAM: CT ANGIOGRAPHY CHEST WITH CONTRAST TECHNIQUE: Multidetector CT imaging of the chest was performed using the standard protocol during bolus administration of intravenous contrast. Multiplanar CT image reconstructions and MIPs were obtained to evaluate the vascular anatomy. CONTRAST:  123m OMNIPAQUE IOHEXOL 350 MG/ML SOLN IV COMPARISON:  None. FINDINGS: Cardiovascular: Aorta normal caliber without aneurysm or dissection. Heart unremarkable. No pericardial effusion. Pulmonary arteries patent. No evidence of pulmonary embolism. Mediastinum/Nodes: Numerous normal sized to mildly  enlarged mediastinal and hilar lymph nodes identified, some containing central calcifications, consistent with history of sarcoidosis. Largest nodes include 14 mm LEFT hilar node image 63 and 9 mm subcarinal and RIGHT paratracheal nodes. Esophagus unremarkable. Base of cervical region normal appearance. Lungs/Pleura: Prior LEFT lower lobectomy. Scarring LEFT upper lobe. Central peribronchial thickening especially LEFT lung. Bronchiectasis LEFT upper lobe, with some of the dilated airways containing fluid/mucous. BILATERAL pulmonary nodules and ill-defined subsolid opacities in LEFT lung. Tree-in-bud opacities in LEFT upper lobe question infection/bronchiolitis. No pleural effusion or pneumothorax. Upper Abdomen: Unremarkable Musculoskeletal: No acute osseous findings. Review of the MIP images confirms the above findings. IMPRESSION: No evidence of pulmonary embolism. Numerous normal sized to mildly enlarged mediastinal and hilar lymph nodes consistent with history of sarcoidosis. Post LEFT lower lobectomy. Scattered pulmonary nodular foci bilaterally, could be related to sarcoidosis, stability uncertain; if patient has any prior outside CT imaging, these would be of benefit in establishing stability of these findings. Central bronchitic changes. 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 Aortic Atherosclerosis (ICD10-I70.0). Electronically Signed   By: MLavonia DanaM.D.   On: 03/31/2021 13:18    Procedures Procedures   Medications Ordered in ED Medications  iohexol (OMNIPAQUE) 350 MG/ML injection 100 mL (100 mLs Intravenous Contrast Given 03/31/21 1234)    ED Course  I have reviewed the triage vital signs and the nursing notes.  Pertinent labs & imaging results that were available during my care of the patient were reviewed by me and considered in my medical decision making (see chart  for details).    MDM Rules/Calculators/A&P                          RAlegandro Macnaughtonis a 50y.o. male who presents to the emergency department for further evaluation of shortness of breath.  History and physical exam is somewhat concerning for underlying infection versus possible malignancy.  CBC was without leukocytosis.  CMP was normal.  D-dimer slightly elevated.  BNP was negative.  COVID and influenza were negative.  Blood cultures are pending.  First lactic acid was negative.  I discontinued the second.  Chest x-ray showed possible lingular disease and recommended CT.  Given the D-dimer elevation and chest x-ray findings CT angio was ordered which revealed tree abutting opacities with possible MAC.  Given the clinical scenario, we touch base with pulmonology who recommended follow-up in the office with no immediate antibiotics at this time.  All questions and concerns addressed from the patient.  He is amenable with this plan.  He is hemodynamically stable and safe for discharge.   Final Clinical Impression(s) / ED Diagnoses Final diagnoses:  Shortness of breath    Rx / DC Orders ED Discharge Orders          Ordered    Ambulatory referral to Pulmonology        03/31/21 1649             Hendricks Limes, PA-C 03/31/21 1653    Gareth Morgan, MD 03/31/21 2307

## 2021-03-31 NOTE — ED Notes (Signed)
PA at bedside.

## 2021-03-31 NOTE — ED Notes (Signed)
Patient ambulatory to Xray.

## 2021-03-31 NOTE — ED Notes (Signed)
Pt ambulated to XR.

## 2021-04-03 ENCOUNTER — Ambulatory Visit (INDEPENDENT_AMBULATORY_CARE_PROVIDER_SITE_OTHER): Payer: 59 | Admitting: Medical

## 2021-04-03 ENCOUNTER — Other Ambulatory Visit: Payer: Self-pay

## 2021-04-03 ENCOUNTER — Encounter: Payer: Self-pay | Admitting: Gastroenterology

## 2021-04-03 VITALS — BP 130/77 | HR 56 | Temp 98.2°F | Resp 18 | Ht 74.0 in | Wt 286.0 lb

## 2021-04-03 DIAGNOSIS — R509 Fever, unspecified: Secondary | ICD-10-CM

## 2021-04-03 DIAGNOSIS — R0600 Dyspnea, unspecified: Secondary | ICD-10-CM | POA: Diagnosis not present

## 2021-04-03 DIAGNOSIS — R5383 Other fatigue: Secondary | ICD-10-CM

## 2021-04-03 DIAGNOSIS — D869 Sarcoidosis, unspecified: Secondary | ICD-10-CM | POA: Diagnosis not present

## 2021-04-03 DIAGNOSIS — R9389 Abnormal findings on diagnostic imaging of other specified body structures: Secondary | ICD-10-CM | POA: Diagnosis not present

## 2021-04-03 DIAGNOSIS — J4520 Mild intermittent asthma, uncomplicated: Secondary | ICD-10-CM

## 2021-04-03 MED ORDER — ALBUTEROL SULFATE HFA 108 (90 BASE) MCG/ACT IN AERS: 2.0000 | INHALATION_SPRAY | RESPIRATORY_TRACT | 2 refills | Status: DC | PRN

## 2021-04-03 NOTE — Patient Instructions (Addendum)
History of sarcoidosis in the past.  Recent worsening shortness of breath and cough.  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   Glad to hear that you have pulmonology appointment in 8 days.  For shortness of breath refilling your albuterol inhaler.  Continue to check O2 sat levels and if O2 sats dropping less than 96% let me know.  We will repeat CBC to evaluate infection fighting cells and get a chest x-ray.  Appointment for lab is tomorrow morning at 315.  Recommend getting chest x-ray before the lab tomorrow.   After reviewing lab and chest x-ray considering reaching out to pulmonologist office to consider possible treatment can be given for possible MAC or if better to wait until her pulmonology appointment.  If you need some days off from work intermittently before pulmonology appointment please let me know.  Follow-up date to be determined after labs, imaging and pulmonology visit.  But to office if needed.

## 2021-04-03 NOTE — Progress Notes (Signed)
Subjective:    Patient ID: Jackson Hatfield, male    DOB: Oct 17, 1970, 50 y.o.   MRN: [id number]  HPI  Pt in for recent shortness of breath and cough. Some fevers as well. Pt states having this for 2-3 months.   Pt recent ED visit below.  "Arrival date & time: 03/31/21  1024     History    Chief Complaint  Patient presents with   Shortness of Breath      Jackson Hatfield is a 50 y.o. 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  He states that he is always been chronically short of breath at baseline since he was a child however has been acutely worse over the last couple of months.  Shortness of breath is worse at night.  He does report associated intermittent chest heaviness however he has chest pain-free right now.  Nothing seems to make his shortness of breath better.  His fever is improved with Tylenol.  He denies any cough, congestion, sore throat, abdominal pain, nausea, vomiting, diarrhea, rectal bleeding, rectal pain, urinary symptoms, leg pain, leg swelling.   The history is provided by the patient and a significant other. No language interpreter was used.  Shortness of Breath    MDM Rules/Calculators/A&P                           Jackson Hatfield is a 50 y.o. male who presents to the emergency department for further evaluation of shortness of breath.  History and physical exam is somewhat concerning for underlying infection versus possible malignancy.   CBC was without leukocytosis.  CMP was normal.  D-dimer slightly elevated.  BNP was negative.  COVID and influenza were negative.  Blood cultures are pending.  First lactic acid was negative.  I discontinued the second.  Chest x-ray showed possible lingular disease and recommended CT.  Given the D-dimer elevation and chest x-ray  findings CT angio was ordered which revealed tree abutting opacities with possible MAC.   Given the clinical scenario, we touch base with pulmonology who recommended follow-up in the office with no immediate antibiotics at this time.  All questions and concerns addressed from the patient.  He is amenable with this plan.  He is hemodynamically stable and safe for discharge."  Pt has appointment next week with pulmonologist 04-11-2021.   Smoked marijuana briefly as teen and cigars for 2 years early 20's.  Pt states when younger had cyst lower left lobe and when younger states some concern cystic fibrosis.  When pt checks his oxygen at home gets 96-98%.   Pt states he is out albuterol inhaler.    Review of Systems  Constitutional:  Negative for chills, fatigue and fever.  Respiratory:  Positive for shortness of breath. Negative for cough, chest tightness and wheezing.   Cardiovascular:  Negative for chest pain and palpitations.  Gastrointestinal:  Negative for abdominal pain.  Genitourinary:  Negative for difficulty urinating, enuresis, flank pain and frequency.  Musculoskeletal:  Negative for back pain, joint swelling and neck pain.  Neurological:  Negative for dizziness, light-headedness and headaches.  Hematological:  Negative for adenopathy.  Psychiatric/Behavioral:  Negative for behavioral problems and confusion.     Past Medical History:  Diagnosis Date   Blood transfusion  without reported diagnosis    many years ago   Diabetes mellitus without complication ([place])    Dyspnea    exerting self,ie: running   GERD (gastroesophageal reflux disease)    ? medicine related   Hyperlipidemia    Hypertension    Pneumonia    Pre-diabetes    Sarcoidosis    Sleep apnea    wears cpap     Social History   Socioeconomic History   Marital status: Divorced    Spouse name: Not on file   Number of children: Not on file   Years of education: Not on file   Highest education level: Not  on file  Occupational History   Not on file  Tobacco Use   Smoking status: Never   Smokeless tobacco: Never  Vaping Use   Vaping Use: Never used  Substance and Sexual Activity   Alcohol use: Yes    Comment: ocassionally   Drug use: No   Sexual activity: Not on file  Other Topics Concern   Not on file  Social History Narrative   Not on file   Social Determinants of Health   Financial Resource Strain: Not on file  Food Insecurity: Not on file  Transportation Needs: Not on file  Physical Activity: Not on file  Stress: Not on file  Social Connections: Not on file  Intimate Partner Violence: Not on file    Past Surgical History:  Procedure Laterality Date   KNEE ARTHROSCOPY WITH MEDIAL MENISECTOMY Right 06/18/2018   Procedure: Right knee arthroscopy, partial medial menisectomy, debridement and drainage of cyst;  Surgeon: [name], MD;  Location: [facility];  Service: Orthopedics;  Laterality: Right;  60 mins   LOBECTOMY Left 2002   lower left  ( cyst on the lobe)in [facility] in [place], [place]    Family History  Problem Relation Age of Onset   COPD Mother    Alcohol abuse Father    Arthritis Father    Diabetes Father    Heart disease Father    Hyperlipidemia Father    Kidney disease Father    Diabetes Brother    Stroke Brother    Alcohol abuse Brother    Diabetes Brother    Drug abuse Brother    Stomach cancer Paternal Uncle    Colon cancer Neg Hx    Colon polyps Neg Hx    Esophageal cancer Neg Hx    Rectal cancer Neg Hx     Allergies  Allergen Reactions   Naproxen Hives   Penicillins Hives    Current Outpatient Medications on File Prior to Visit  Medication Sig Dispense Refill   albuterol (PROVENTIL HFA;VENTOLIN HFA) 108 (90 Base) MCG/ACT inhaler Inhale 2 puffs into the lungs every 4 (four) hours as needed for wheezing or shortness of breath. 1 Inhaler 2   blood glucose meter kit and supplies Check blood sugar 2 times daily (E11.9). 1 each 0    losartan (COZAAR) 100 MG tablet TAKE 1 TABLET BY MOUTH EVERY DAY 90 tablet 1   metFORMIN (GLUCOPHAGE) 500 MG tablet TAKE 1 TABLET (500 MG TOTAL) BY MOUTH DAILY. 90 tablet 1   Multiple Vitamins-Minerals (MENS ONE DAILY PO) Take 1 tablet by mouth daily.      tamsulosin (FLOMAX) 0.4 MG CAPS capsule Take 1 capsule (0.4 mg total) by mouth daily. 90 capsule 3   Current Facility-Administered Medications on File Prior to Visit  Medication Dose Route Frequency Provider Last Rate Last Admin  0.9 %  sodium chloride infusion  500 mL Intravenous Once Ladene Artist, MD        BP 130/77   Pulse (!) 56   Temp 98.2 F (36.8 C)   Resp 18   Ht _0  (1.88 m)   Wt 286 lb (129.7 kg)   SpO2 97%   BMI 36.72 kg/m       Objective:   Physical Exam  General Mental Status- Alert. General Appearance- Not in acute distress.   Skin General: Color- Normal Color. Moisture- Normal Moisture.  Neck Carotid Arteries- Normal color. Moisture- Normal Moisture. No carotid bruits. No JVD.  Chest and Lung Exam Auscultation: Breath Sounds:-Normal.  Cardiovascular Auscultation:Rythm- Regular. Murmurs & Other Heart Sounds:Auscultation of the heart reveals- No Murmurs.  Abdomen Inspection:-Inspeection Normal. Palpation/Percussion:Note:No mass. Palpation and Percussion of the abdomen reveal- Non Tender, Non Distended + BS, no rebound or guarding.   Neurologic Cranial Nerve exam:- CN III-XII intact(No nystagmus), symmetric smile. Strength:- 5/5 equal and symmetric strength both upper and lower extremities.   Lower ext- no pedal edema calfs symmetric.    Assessment & Plan:   Patient Instructions  History of sarcoidosis in the past.  Recent worsening shortness of breath and cough.  Work-up in the emergency department negative for PE but some findings of possible Scattered tree-in-bud opacities in LEFT lung, as well as bronchiectasis in LEFT upper lobe, suggestive of mycobacterium avium intracellulare  complex (MAC) infection.   Glad to hear that you have pulmonology appointment in 8 days.  For shortness of breath refilling your albuterol inhaler.  Continue to check O2 sat levels and if O2 sats dropping less than 96% let me know.  We will repeat CBC to evaluate infection fighting cells and get a chest x-ray.  Appointment for lab is tomorrow morning at 315.  Recommend getting chest x-ray before the lab tomorrow.   After reviewing lab and chest x-ray considering reaching out to pulmonologist office to consider possible treatment can be given for possible MAC or if better to wait until her pulmonology appointment.  If you need some days off from work intermittently before pulmonology appointment please let me know.  Follow-up date to be determined after labs, imaging and pulmonology visit.  But to office if needed.   Mackie Pai, PA-C   Time spent with patient today was  33 minutes which consisted of chart revdiew, discussing diagnosis, work up treatment and documentation.

## 2021-04-04 ENCOUNTER — Telehealth: Payer: Self-pay | Admitting: Medical

## 2021-04-04 ENCOUNTER — Ambulatory Visit (HOSPITAL_BASED_OUTPATIENT_CLINIC_OR_DEPARTMENT_OTHER)
Admission: RE | Admit: 2021-04-04 | Discharge: 2021-04-04 | Disposition: A | Discharge status: Home / Self Care | Payer: 59 | Source: Ambulatory Visit | Attending: Medical | Admitting: Medical

## 2021-04-04 ENCOUNTER — Other Ambulatory Visit (INDEPENDENT_AMBULATORY_CARE_PROVIDER_SITE_OTHER): Payer: 59

## 2021-04-04 DIAGNOSIS — R509 Fever, unspecified: Secondary | ICD-10-CM | POA: Diagnosis not present

## 2021-04-04 DIAGNOSIS — R0600 Dyspnea, unspecified: Secondary | ICD-10-CM | POA: Diagnosis not present

## 2021-04-04 DIAGNOSIS — R5383 Other fatigue: Secondary | ICD-10-CM | POA: Diagnosis not present

## 2021-04-04 LAB — COMPREHENSIVE METABOLIC PANEL
ALT: 20 U/L (ref 0–53)
AST: 17 U/L (ref 0–37)
Albumin: 4.2 g/dL (ref 3.5–5.2)
Alkaline Phosphatase: 68 U/L (ref 39–117)
BUN: 21 mg/dL (ref 6–23)
CO2: 29 mEq/L (ref 19–32)
Calcium: 9.5 mg/dL (ref 8.4–10.5)
Chloride: 104 mEq/L (ref 96–112)
Creatinine, Ser: 0.7 mg/dL (ref 0.40–1.50)
GFR: 106.02 mL/min (ref >60.00)
Glucose, Bld: 121 mg/dL — ABNORMAL HIGH (ref 70–99)
Potassium: 4 mEq/L (ref 3.5–5.1)
Sodium: 139 mEq/L (ref 135–145)
Total Bilirubin: 0.3 mg/dL (ref 0.2–1.2)
Total Protein: 7.4 g/dL (ref 6.0–8.3)

## 2021-04-04 LAB — CBC WITH DIFFERENTIAL/PLATELET
Basophils Absolute: 0.1 10*3/uL (ref 0.0–0.1)
Basophils Relative: 0.7 % (ref 0.0–3.0)
Eosinophils Absolute: 0.3 10*3/uL (ref 0.0–0.7)
Eosinophils Relative: 3.4 % (ref 0.0–5.0)
HCT: 37.8 % — ABNORMAL LOW (ref 39.0–52.0)
Hemoglobin: 12.6 g/dL — ABNORMAL LOW (ref 13.0–17.0)
Lymphocytes Relative: 20 % (ref 12.0–46.0)
Lymphs Abs: 1.7 10*3/uL (ref 0.7–4.0)
MCHC: 33.3 g/dL (ref 30.0–36.0)
MCV: 85.7 fl (ref 78.0–100.0)
Monocytes Absolute: 0.7 10*3/uL (ref 0.1–1.0)
Monocytes Relative: 8.3 % (ref 3.0–12.0)
Neutro Abs: 5.7 10*3/uL (ref 1.4–7.7)
Neutrophils Relative %: 67.6 % (ref 43.0–77.0)
Platelets: 380 10*3/uL (ref 150.0–400.0)
RBC: 4.41 Mil/uL (ref 4.22–5.81)
RDW: 14.2 % (ref 11.5–15.5)
WBC: 8.4 10*3/uL (ref 4.0–10.5)

## 2021-04-04 NOTE — Telephone Encounter (Signed)
  Dr. Erin Fulling,  Pt Jackson Hatfield [facility] Work up included ct chest which showed possible MAC. On follow up pt reported having chills, sweats, fever and shortness of breath. Similar persisting as when was in ED.   I believe he is scheduled to see you in about one week. On follow up with me his cbc did not show elevated wbc. Cxr stated similar finding to what ct showed. Overall clinically stable when I saw him. ED did not treat with antibiotic though MAC concern. Thanks for seeing him. Is it possible to see he sooner than scheduled?  Thanks, [employer] PA-C  Ct impression.  "Central bronchitic changes.   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

## 2021-04-04 NOTE — Addendum Note (Signed)
Addended by: Kelle Darting A on: 04/04/2021 11:47 AM   Modules accepted: Orders

## 2021-04-05 LAB — CULTURE, BLOOD (ROUTINE X 2)
Culture: NO GROWTH
Culture: NO GROWTH
Special Requests: ADEQUATE
Special Requests: ADEQUATE

## 2021-04-08 ENCOUNTER — Encounter: Payer: Self-pay | Admitting: Medical

## 2021-04-11 ENCOUNTER — Other Ambulatory Visit: Payer: Self-pay

## 2021-04-11 ENCOUNTER — Encounter: Payer: Self-pay | Admitting: Pulmonary Disease

## 2021-04-11 ENCOUNTER — Ambulatory Visit (INDEPENDENT_AMBULATORY_CARE_PROVIDER_SITE_OTHER): Payer: 59 | Admitting: Pulmonary Disease

## 2021-04-11 VITALS — BP 120/72 | HR 88 | Ht 74.0 in | Wt 285.4 lb

## 2021-04-11 DIAGNOSIS — J479 Bronchiectasis, uncomplicated: Secondary | ICD-10-CM | POA: Diagnosis not present

## 2021-04-11 MED ORDER — ALBUTEROL SULFATE (2.5 MG/3ML) 0.083% IN NEBU: 2.5000 mg | INHALATION_SOLUTION | Freq: Three times a day (TID) | RESPIRATORY_TRACT | 5 refills | Status: DC | PRN

## 2021-04-11 MED ORDER — STIOLTO RESPIMAT 2.5-2.5 MCG/ACT IN AERS: 2.0000 | INHALATION_SPRAY | Freq: Every day | RESPIRATORY_TRACT | 0 refills | Status: DC

## 2021-04-11 NOTE — Progress Notes (Signed)
Patient seen in the office today and instructed on use of Stiolto.  Patient expressed understanding and demonstrated technique.  Jackson Hatfield [facility] 04/11/2021

## 2021-04-11 NOTE — H&P (View-Only) (Signed)
Synopsis: Referred in October 2022 for abnormal CT Chest by Collier Flowers, PA  Subjective:   PATIENT ID: Jackson Hatfield GENDER: male DOB: 07-25-1970, MRN: [id number]   HPI  Chief Complaint  Patient presents with   Consult    Referred by ED for SOB. States this has been doing on for about a month. The SOB tends to get worse with activities.    Jackson Hatfield is a 50 year old male, former smoker with history of obesity, obstructive sleep apnea on CPAP, sarcoidosis and status post left lower lobectomy in 2003 for abnormal nodule who is referred to pulmonary clinic for abnormal CT Chest scan.   He recently went to the ER on 03/31/2021 for shortness of breath, fatigue, weight loss, fever and chills.  Laboratory evaluation was unremarkable including CBC and BMP.  CT chest scan was obtained which showed mildly enlarged mediastinal and hilar lymph nodes with some containing central calcifications consistent with his history of sarcoidosis.  There is notable left lower lobectomy.  Scarring of the left upper lobe.  There is central peribronchial thickening especially of the left lung bronchiectasis of the left upper lobe with some dilated airways containing fluid/mucus.  Tree-in-bud opacities in the left upper lobe.  Patient reports a 25 pound weight loss since March.  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  He does not have any wheezing and denies any sputum production.  He reports being diagnosed with cystic fibrosis when he was younger but has not followed up with any cystic fibrosis specialist most of his life.  He is using CPAP nightly for his obstructive sleep apnea.  He underwent left lower lobectomy in 2003 when he used to live in [place] and was told that he has a cyst.  When he moved to [place] he  underwent further evaluation including a mediastinoscopy and was diagnosed with sarcoidosis in 2006.  Past Medical History:  Diagnosis Date   Blood transfusion without reported diagnosis    many years ago   Diabetes mellitus without complication ([place])    Dyspnea    exerting self,ie: running   GERD (gastroesophageal reflux disease)    ? medicine related   Hyperlipidemia    Hypertension    Pneumonia    Pre-diabetes    Sarcoidosis    Sleep apnea    wears cpap     Family History  Problem Relation Age of Onset   COPD Mother    Alcohol abuse Father    Arthritis Father    Diabetes Father    Heart disease Father    Hyperlipidemia Father    Kidney disease Father    Diabetes Brother    Stroke Brother    Alcohol abuse Brother    Diabetes Brother    Drug abuse Brother    Stomach cancer Paternal Uncle    Colon cancer Neg Hx    Colon polyps Neg Hx    Esophageal cancer Neg Hx    Rectal cancer Neg Hx      Social History   Socioeconomic History   Marital status: Divorced    Spouse name: Not on file   Number of children: Not on file   Years of education: Not on file   Highest education level: Not on file  Occupational History   Not  on file  Tobacco Use   Smoking status: Never   Smokeless tobacco: Never  Vaping Use   Vaping Use: Never used  Substance and Sexual Activity   Alcohol use: Yes    Comment: ocassionally   Drug use: No   Sexual activity: Not on file  Other Topics Concern   Not on file  Social History Narrative   Not on file   Social Determinants of Health   Financial Resource Strain: Not on file  Food Insecurity: Not on file  Transportation Needs: Not on file  Physical Activity: Not on file  Stress: Not on file  Social Connections: Not on file  Intimate Partner Violence: Not on file     Allergies  Allergen Reactions   Naproxen Hives   Penicillins Hives     Outpatient Medications Prior to Visit  Medication Sig Dispense Refill   albuterol  (VENTOLIN HFA) 108 (90 Base) MCG/ACT inhaler Inhale 2 puffs into the lungs every 4 (four) hours as needed for wheezing or shortness of breath. 1 each 2   blood glucose meter kit and supplies Check blood sugar 2 times daily (E11.9). 1 each 0   losartan (COZAAR) 100 MG tablet TAKE 1 TABLET BY MOUTH EVERY DAY 90 tablet 1   metFORMIN (GLUCOPHAGE) 500 MG tablet TAKE 1 TABLET (500 MG TOTAL) BY MOUTH DAILY. 90 tablet 1   Multiple Vitamins-Minerals (MENS ONE DAILY PO) Take 1 tablet by mouth daily.      tamsulosin (FLOMAX) 0.4 MG CAPS capsule Take 1 capsule (0.4 mg total) by mouth daily. 90 capsule 3   Facility-Administered Medications Prior to Visit  Medication Dose Route Frequency Provider Last Rate Last Admin   0.9 %  sodium chloride infusion  500 mL Intravenous Once Ladene Artist, MD        Review of Systems  Constitutional:  Positive for chills, fever, malaise/fatigue and weight loss.  HENT:  Negative for congestion, sinus pain and sore throat.   Eyes: Negative.   Respiratory:  Positive for shortness of breath. Negative for cough, hemoptysis, sputum production and wheezing.   Cardiovascular:  Negative for chest pain, palpitations, orthopnea, claudication and leg swelling.  Gastrointestinal:  Negative for abdominal pain, heartburn, nausea and vomiting.  Genitourinary: Negative.   Musculoskeletal:  Negative for joint pain and myalgias.  Skin:  Negative for rash.  Neurological:  Negative for weakness.  Endo/Heme/Allergies: Negative.   Psychiatric/Behavioral: Negative.       Objective:   Vitals:   04/11/21 1602  BP: 120/72  Pulse: 88  SpO2: 96%  Weight: 285 lb 6.4 oz (129.5 kg)  Height: _0  (1.88 m)     Physical Exam Constitutional:      General: He is not in acute distress.    Appearance: Normal appearance. He is obese.  HENT:     Head: Normocephalic and atraumatic.  Eyes:     Extraocular Movements: Extraocular movements intact.     Conjunctiva/sclera: Conjunctivae  normal.     Pupils: Pupils are equal, round, and reactive to light.  Cardiovascular:     Rate and Rhythm: Normal rate and regular rhythm.     Pulses: Normal pulses.     Heart sounds: Normal heart sounds. No murmur heard. Pulmonary:     Effort: Pulmonary effort is normal.     Breath sounds: Decreased breath sounds present. No wheezing, rhonchi or rales.  Abdominal:     General: Bowel sounds are normal.     Palpations: Abdomen is soft.  Musculoskeletal:     Right lower leg: No edema.     Left lower leg: No edema.  Lymphadenopathy:     Cervical: No cervical adenopathy.  Skin:    General: Skin is warm and dry.  Neurological:     General: No focal deficit present.     Mental Status: He is alert.  Psychiatric:        Mood and Affect: Mood normal.        Behavior: Behavior normal.        Thought Content: Thought content normal.        Judgment: Judgment normal.   CBC    Component Value Date/Time   WBC 8.4 04/04/2021 1443   RBC 4.41 04/04/2021 1443   HGB 12.6 (L) 04/04/2021 1443   HCT 37.8 (L) 04/04/2021 1443   PLT 380.0 04/04/2021 1443   MCV 85.7 04/04/2021 1443   MCH 29.0 03/31/2021 1116   MCHC 33.3 04/04/2021 1443   RDW 14.2 04/04/2021 1443   LYMPHSABS 1.7 04/04/2021 1443   MONOABS 0.7 04/04/2021 1443   EOSABS 0.3 04/04/2021 1443   BASOSABS 0.1 04/04/2021 1443   BMP Latest Ref Rng & Units 04/04/2021 03/31/2021 12/28/2020  Glucose 70 - 99 mg/dL 121(H) 124(H) 103(H)  BUN 6 - 23 mg/dL _0 Creatinine 0.40 - 1.50 mg/dL 0.70 0.67 0.75  Sodium 135 - 145 mEq/L 139 137 135  Potassium 3.5 - 5.1 mEq/L 4.0 4.2 4.2  Chloride 96 - 112 mEq/L 104 102 101  CO2 19 - 32 mEq/L _1 Calcium 8.4 - 10.5 mg/dL 9.5 9.2 8.9   Chest imaging: CTA Chest 03/31/21 -No evidence of pulmonary embolism. -Numerous normal sized to mildly enlarged mediastinal and hilar lymph nodes consistent with history of sarcoidosis. -Post LEFT lower lobectomy.  -Scattered pulmonary nodular foci  bilaterally, could be related to sarcoidosis, stability uncertain; if patient has any prior outside CT imaging, these would be of benefit in establishing stability of these findings. -Central bronchitic changes.  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   PFT: No flowsheet data found.  Labs:  Path:  Echo:  Heart Catheterization:  Assessment & Plan:   Bronchiectasis without complication ([place]) - Plan: Ambulatory Referral for DME  Discussion: Jackson Hatfield is a 50 year old male, former smoker with history of obesity, obstructive sleep apnea on CPAP, sarcoidosis and status post left lower lobectomy in 2003 for abnormal nodule who is referred to pulmonary clinic for abnormal CT Chest scan.   Patient has significant tubular bronchiectasis with mucoid impaction of his left lung with tree-in-bud opacities concerning for infectious process.  Given that he is unable to provide sputum samples for respiratory cultures and AFB testing we will schedule him for bronchoscopy on 10/25 in order to obtain BAL samples for respiratory, AFB and fungal cultures.  We will also send a sample for cytology.  For treatment of his bronchiectasis we will order him a nebulizer machine and albuterol nebulizer solution.  He is to use the nebulizer treatments 2-3 times per day followed by flutter valve therapy for bronchial hygiene and mucus clearance.  We will also start him on Stiolto 2 puffs once daily and monitor for any improvement in his shortness of breath.  Once we have culture results we will plan treatment accordingly with antibiotic therapy.  Once he has recovered from this current issue with his bronchiectasis we  will consider further evaluation for cystic fibrosis based on his reported history.  Freda Jackson, MD [facility] Office:  [phone]   Current Outpatient Medications:    albuterol (PROVENTIL) (2.5 MG/3ML) 0.083% nebulizer solution, Take 3 mLs (2.5 mg total) by nebulization 3 (three) times daily as needed for wheezing or shortness of breath., Disp: 30 mL, Rfl: 5   albuterol (VENTOLIN HFA) 108 (90 Base) MCG/ACT inhaler, Inhale 2 puffs into the lungs every 4 (four) hours as needed for wheezing or shortness of breath., Disp: 1 each, Rfl: 2   blood glucose meter kit and supplies, Check blood sugar 2 times daily (E11.9)., Disp: 1 each, Rfl: 0   losartan (COZAAR) 100 MG tablet, TAKE 1 TABLET BY MOUTH EVERY DAY, Disp: 90 tablet, Rfl: 1   metFORMIN (GLUCOPHAGE) 500 MG tablet, TAKE 1 TABLET (500 MG TOTAL) BY MOUTH DAILY., Disp: 90 tablet, Rfl: 1   Multiple Vitamins-Minerals (MENS ONE DAILY PO), Take 1 tablet by mouth daily. , Disp: , Rfl:    tamsulosin (FLOMAX) 0.4 MG CAPS capsule, Take 1 capsule (0.4 mg total) by mouth daily., Disp: 90 capsule, Rfl: 3   Tiotropium Bromide-Olodaterol (STIOLTO RESPIMAT) 2.5-2.5 MCG/ACT AERS, Inhale 2 puffs into the lungs daily., Disp: 8 g, Rfl: 0  Current Facility-Administered Medications:    0.9 %  sodium chloride infusion, 500 mL, Intravenous, Once, Fuller Plan Pricilla Riffle, MD

## 2021-04-11 NOTE — Patient Instructions (Addendum)
Your CT Chest scan is concerning for cylindrical bronchiectasis with mucous impaction. I am concerned for an on going infection in this area that is leading to a lot of your symptoms.   We will order you a nebulizer machine, supplies and albuterol solution.   Use albuterol nebulizer treatment 2-3 times per day followed by flutter valve  Use stiolto inhaler 2 puffs daily  You should remain out of work until further workup is completed. We will be happy to fill out any information needed for short term disability.

## 2021-04-11 NOTE — Progress Notes (Signed)
Synopsis: Referred in October 2022 for abnormal CT Chest by Collier Flowers, PA  Subjective:   PATIENT ID: Jackson Hatfield GENDER: male DOB: 07-25-1970, MRN: [id number]   HPI  Chief Complaint  Patient presents with   Consult    Referred by ED for SOB. States this has been doing on for about a month. The SOB tends to get worse with activities.    Marcello Tuzzolino is a 50 year old male, former smoker with history of obesity, obstructive sleep apnea on CPAP, sarcoidosis and status post left lower lobectomy in 2003 for abnormal nodule who is referred to pulmonary clinic for abnormal CT Chest scan.   He recently went to the ER on 03/31/2021 for shortness of breath, fatigue, weight loss, fever and chills.  Laboratory evaluation was unremarkable including CBC and BMP.  CT chest scan was obtained which showed mildly enlarged mediastinal and hilar lymph nodes with some containing central calcifications consistent with his history of sarcoidosis.  There is notable left lower lobectomy.  Scarring of the left upper lobe.  There is central peribronchial thickening especially of the left lung bronchiectasis of the left upper lobe with some dilated airways containing fluid/mucus.  Tree-in-bud opacities in the left upper lobe.  Patient reports a 25 pound weight loss since March.  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  He does not have any wheezing and denies any sputum production.  He reports being diagnosed with cystic fibrosis when he was younger but has not followed up with any cystic fibrosis specialist most of his life.  He is using CPAP nightly for his obstructive sleep apnea.  He underwent left lower lobectomy in 2003 when he used to live in [place] and was told that he has a cyst.  When he moved to [place] he  underwent further evaluation including a mediastinoscopy and was diagnosed with sarcoidosis in 2006.  Past Medical History:  Diagnosis Date   Blood transfusion without reported diagnosis    many years ago   Diabetes mellitus without complication ([place])    Dyspnea    exerting self,ie: running   GERD (gastroesophageal reflux disease)    ? medicine related   Hyperlipidemia    Hypertension    Pneumonia    Pre-diabetes    Sarcoidosis    Sleep apnea    wears cpap     Family History  Problem Relation Age of Onset   COPD Mother    Alcohol abuse Father    Arthritis Father    Diabetes Father    Heart disease Father    Hyperlipidemia Father    Kidney disease Father    Diabetes Brother    Stroke Brother    Alcohol abuse Brother    Diabetes Brother    Drug abuse Brother    Stomach cancer Paternal Uncle    Colon cancer Neg Hx    Colon polyps Neg Hx    Esophageal cancer Neg Hx    Rectal cancer Neg Hx      Social History   Socioeconomic History   Marital status: Divorced    Spouse name: Not on file   Number of children: Not on file   Years of education: Not on file   Highest education level: Not on file  Occupational History   Not  on file  Tobacco Use   Smoking status: Never   Smokeless tobacco: Never  Vaping Use   Vaping Use: Never used  Substance and Sexual Activity   Alcohol use: Yes    Comment: ocassionally   Drug use: No   Sexual activity: Not on file  Other Topics Concern   Not on file  Social History Narrative   Not on file   Social Determinants of Health   Financial Resource Strain: Not on file  Food Insecurity: Not on file  Transportation Needs: Not on file  Physical Activity: Not on file  Stress: Not on file  Social Connections: Not on file  Intimate Partner Violence: Not on file     Allergies  Allergen Reactions   Naproxen Hives   Penicillins Hives     Outpatient Medications Prior to Visit  Medication Sig Dispense Refill   albuterol  (VENTOLIN HFA) 108 (90 Base) MCG/ACT inhaler Inhale 2 puffs into the lungs every 4 (four) hours as needed for wheezing or shortness of breath. 1 each 2   blood glucose meter kit and supplies Check blood sugar 2 times daily (E11.9). 1 each 0   losartan (COZAAR) 100 MG tablet TAKE 1 TABLET BY MOUTH EVERY DAY 90 tablet 1   metFORMIN (GLUCOPHAGE) 500 MG tablet TAKE 1 TABLET (500 MG TOTAL) BY MOUTH DAILY. 90 tablet 1   Multiple Vitamins-Minerals (MENS ONE DAILY PO) Take 1 tablet by mouth daily.      tamsulosin (FLOMAX) 0.4 MG CAPS capsule Take 1 capsule (0.4 mg total) by mouth daily. 90 capsule 3   Facility-Administered Medications Prior to Visit  Medication Dose Route Frequency Provider Last Rate Last Admin   0.9 %  sodium chloride infusion  500 mL Intravenous Once Ladene Artist, MD        Review of Systems  Constitutional:  Positive for chills, fever, malaise/fatigue and weight loss.  HENT:  Negative for congestion, sinus pain and sore throat.   Eyes: Negative.   Respiratory:  Positive for shortness of breath. Negative for cough, hemoptysis, sputum production and wheezing.   Cardiovascular:  Negative for chest pain, palpitations, orthopnea, claudication and leg swelling.  Gastrointestinal:  Negative for abdominal pain, heartburn, nausea and vomiting.  Genitourinary: Negative.   Musculoskeletal:  Negative for joint pain and myalgias.  Skin:  Negative for rash.  Neurological:  Negative for weakness.  Endo/Heme/Allergies: Negative.   Psychiatric/Behavioral: Negative.       Objective:   Vitals:   04/11/21 1602  BP: 120/72  Pulse: 88  SpO2: 96%  Weight: 285 lb 6.4 oz (129.5 kg)  Height: _0  (1.88 m)     Physical Exam Constitutional:      General: He is not in acute distress.    Appearance: Normal appearance. He is obese.  HENT:     Head: Normocephalic and atraumatic.  Eyes:     Extraocular Movements: Extraocular movements intact.     Conjunctiva/sclera: Conjunctivae  normal.     Pupils: Pupils are equal, round, and reactive to light.  Cardiovascular:     Rate and Rhythm: Normal rate and regular rhythm.     Pulses: Normal pulses.     Heart sounds: Normal heart sounds. No murmur heard. Pulmonary:     Effort: Pulmonary effort is normal.     Breath sounds: Decreased breath sounds present. No wheezing, rhonchi or rales.  Abdominal:     General: Bowel sounds are normal.     Palpations: Abdomen is soft.  Musculoskeletal:     Right lower leg: No edema.     Left lower leg: No edema.  Lymphadenopathy:     Cervical: No cervical adenopathy.  Skin:    General: Skin is warm and dry.  Neurological:     General: No focal deficit present.     Mental Status: He is alert.  Psychiatric:        Mood and Affect: Mood normal.        Behavior: Behavior normal.        Thought Content: Thought content normal.        Judgment: Judgment normal.   CBC    Component Value Date/Time   WBC 8.4 04/04/2021 1443   RBC 4.41 04/04/2021 1443   HGB 12.6 (L) 04/04/2021 1443   HCT 37.8 (L) 04/04/2021 1443   PLT 380.0 04/04/2021 1443   MCV 85.7 04/04/2021 1443   MCH 29.0 03/31/2021 1116   MCHC 33.3 04/04/2021 1443   RDW 14.2 04/04/2021 1443   LYMPHSABS 1.7 04/04/2021 1443   MONOABS 0.7 04/04/2021 1443   EOSABS 0.3 04/04/2021 1443   BASOSABS 0.1 04/04/2021 1443   BMP Latest Ref Rng & Units 04/04/2021 03/31/2021 12/28/2020  Glucose 70 - 99 mg/dL 121(H) 124(H) 103(H)  BUN 6 - 23 mg/dL _0 Creatinine 0.40 - 1.50 mg/dL 0.70 0.67 0.75  Sodium 135 - 145 mEq/L 139 137 135  Potassium 3.5 - 5.1 mEq/L 4.0 4.2 4.2  Chloride 96 - 112 mEq/L 104 102 101  CO2 19 - 32 mEq/L _1 Calcium 8.4 - 10.5 mg/dL 9.5 9.2 8.9   Chest imaging: CTA Chest 03/31/21 -No evidence of pulmonary embolism. -Numerous normal sized to mildly enlarged mediastinal and hilar lymph nodes consistent with history of sarcoidosis. -Post LEFT lower lobectomy.  -Scattered pulmonary nodular foci  bilaterally, could be related to sarcoidosis, stability uncertain; if patient has any prior outside CT imaging, these would be of benefit in establishing stability of these findings. -Central bronchitic changes.  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   PFT: No flowsheet data found.  Labs:  Path:  Echo:  Heart Catheterization:  Assessment & Plan:   Bronchiectasis without complication ([place]) - Plan: Ambulatory Referral for DME  Discussion: Rubin Dais is a 50 year old male, former smoker with history of obesity, obstructive sleep apnea on CPAP, sarcoidosis and status post left lower lobectomy in 2003 for abnormal nodule who is referred to pulmonary clinic for abnormal CT Chest scan.   Patient has significant tubular bronchiectasis with mucoid impaction of his left lung with tree-in-bud opacities concerning for infectious process.  Given that he is unable to provide sputum samples for respiratory cultures and AFB testing we will schedule him for bronchoscopy on 10/25 in order to obtain BAL samples for respiratory, AFB and fungal cultures.  We will also send a sample for cytology.  For treatment of his bronchiectasis we will order him a nebulizer machine and albuterol nebulizer solution.  He is to use the nebulizer treatments 2-3 times per day followed by flutter valve therapy for bronchial hygiene and mucus clearance.  We will also start him on Stiolto 2 puffs once daily and monitor for any improvement in his shortness of breath.  Once we have culture results we will plan treatment accordingly with antibiotic therapy.  Once he has recovered from this current issue with his bronchiectasis we  will consider further evaluation for cystic fibrosis based on his reported history.  Freda Jackson, MD [facility] Office:  [phone]   Current Outpatient Medications:    albuterol (PROVENTIL) (2.5 MG/3ML) 0.083% nebulizer solution, Take 3 mLs (2.5 mg total) by nebulization 3 (three) times daily as needed for wheezing or shortness of breath., Disp: 30 mL, Rfl: 5   albuterol (VENTOLIN HFA) 108 (90 Base) MCG/ACT inhaler, Inhale 2 puffs into the lungs every 4 (four) hours as needed for wheezing or shortness of breath., Disp: 1 each, Rfl: 2   blood glucose meter kit and supplies, Check blood sugar 2 times daily (E11.9)., Disp: 1 each, Rfl: 0   losartan (COZAAR) 100 MG tablet, TAKE 1 TABLET BY MOUTH EVERY DAY, Disp: 90 tablet, Rfl: 1   metFORMIN (GLUCOPHAGE) 500 MG tablet, TAKE 1 TABLET (500 MG TOTAL) BY MOUTH DAILY., Disp: 90 tablet, Rfl: 1   Multiple Vitamins-Minerals (MENS ONE DAILY PO), Take 1 tablet by mouth daily. , Disp: , Rfl:    tamsulosin (FLOMAX) 0.4 MG CAPS capsule, Take 1 capsule (0.4 mg total) by mouth daily., Disp: 90 capsule, Rfl: 3   Tiotropium Bromide-Olodaterol (STIOLTO RESPIMAT) 2.5-2.5 MCG/ACT AERS, Inhale 2 puffs into the lungs daily., Disp: 8 g, Rfl: 0  Current Facility-Administered Medications:    0.9 %  sodium chloride infusion, 500 mL, Intravenous, Once, Fuller Plan Pricilla Riffle, MD

## 2021-04-12 ENCOUNTER — Encounter: Payer: Self-pay | Admitting: Pulmonary Disease

## 2021-04-12 ENCOUNTER — Telehealth: Payer: Self-pay | Admitting: *Deleted

## 2021-04-12 DIAGNOSIS — J479 Bronchiectasis, uncomplicated: Secondary | ICD-10-CM

## 2021-04-12 NOTE — Telephone Encounter (Signed)
10/26 at 8am would work as well. Please check with the patient if this date and time will work. The patient requested 10/25 as he would have a ride that morning.   Thanks, jon

## 2021-04-12 NOTE — Telephone Encounter (Signed)
Dr Erin Fulling, I called to set this up for you and was advised by [facility] with scheduling that this date is not available. Is there another date you want to try for? Please advise and route this back to the procedure pool, thanks!

## 2021-04-12 NOTE — Telephone Encounter (Signed)
-----   Message from [name], MD sent at 04/11/2021  4:47 PM EDT ----- Regarding: Flex Bronch w/ BAL Please schedule the following:  Provider performing procedure: Freda Jackson Diagnosis: Bronchiectasis Which side if for nodule / mass? N/a Procedure: Flexible Bronchoscopy with BAL  Has patient been spoken to by Provider and given informed consent? yes Anesthesia: yes Do you need Fluro? no Duration of procedure: 30 minutes Date: 10/25 Alternate Date: n/a Time: 8 AM Location: [facility] Does patient have OSA? Yes DM? No Or Latex allergy? No Medication Restriction: No Anticoagulate/Antiplatelet: No Pre-op Labs Ordered:determined by Anesthesia Imaging request: None  (If, SuperDimension CT Chest, please have STAT courier sent to ENDO)  Please coordinate Pre-op COVID Testing   Thanks, Freda Jackson, MD [facility] Office: [phone]

## 2021-04-12 NOTE — Telephone Encounter (Signed)
Spoke with the pt and made aware we can't do 10/25. He says he has to check with his daughter about 10/26 and will call us back. Will hold in procedure pool.

## 2021-04-15 NOTE — Telephone Encounter (Signed)
Called and spoke with patient. Let them know their Bronch is scheduled for 04/17/2021 with Dr. Erin Fulling at [facility] at 8 am.  Patient was instructed to arrive at hospital at 6 am. They were instructed to bring someone with them as they will not be able to drive home from procedure. Patient instructed not to have anything to eat or drink after midnight.   Patient's covid screening is scheduled for 04/16/2021  Patient voiced understanding, nothing further needed  Routing to Dr. Erin Fulling as Juluis Rainier

## 2021-04-16 ENCOUNTER — Telehealth: Payer: Self-pay | Admitting: Pulmonary Disease

## 2021-04-16 ENCOUNTER — Other Ambulatory Visit: Payer: Self-pay | Admitting: Internal Medicine

## 2021-04-16 NOTE — Telephone Encounter (Signed)
Received and Epic message from Tory Emerald, RN with Endo for patient's bronch scheduled for tomorrow. She stated that it was scheduled in a room that was closed due to staffing and that patient needs to be rescheduled to Friday morning if possible. ATC patient to see if he got his covid test done today and to let him know, LMTCB   When patient calls back please let [facility] or someone in Triage know ASAP so this can be handled ASAP

## 2021-04-16 NOTE — Telephone Encounter (Signed)
Patient called back and I informed him that we need to cancel his procedure for tomorrow due to scheduling error. Asked patient if he got his covid test today and he said yes he did and also asked him if he was able to do procedure on Friday morning instead. He said he will call and check with his daughter to see if that will work. He also asked if next Friday would be possible. Looks like Dr. Erin Fulling will be working in the hospital that day so will forward this message to him to see if that is a possible option. Patient asked me to call him back before end of day to see if either date works for them.   Dr. Erin Fulling please advise if next Friday 11/4 is a possible date.

## 2021-04-17 ENCOUNTER — Other Ambulatory Visit: Payer: Self-pay | Admitting: Medical

## 2021-04-17 DIAGNOSIS — N401 Enlarged prostate with lower urinary tract symptoms: Secondary | ICD-10-CM

## 2021-04-17 LAB — SARS CORONAVIRUS 2 (TAT 6-24 HRS): SARS Coronavirus 2: NEGATIVE

## 2021-04-17 NOTE — Telephone Encounter (Signed)
Friday the 11/14 would work fine since I will be working at [employer] long that day.   Thanks, Wille Glaser

## 2021-04-17 NOTE — Telephone Encounter (Signed)
ATC patient to make sure that 11/4 still works for him, [facility]

## 2021-04-17 NOTE — Telephone Encounter (Signed)
I mean 11/4 would work. It would be best if it could be scheduled in the afternoon that day though. I am at main campus that day and would have to got to [place] for the procedure.  Wille Glaser

## 2021-04-18 NOTE — Telephone Encounter (Signed)
Thank you for your help.  Jackson Hatfield

## 2021-04-18 NOTE — Telephone Encounter (Signed)
Called and spoke with patient. Let them know their Bronch is scheduled for 04/26/21 with Dr. Erin Fulling at [facility].  Patient was instructed to arrive at hospital at 1:00 pm. They were instructed to bring someone with them as they will not be able to drive home from procedure. Patient instructed not to have anything to eat or drink after midnight. Patient's covid screening is scheduled at covid testing site for 04/23/2021 anytime between 8-3 pm.  Patient voiced understanding, nothing further needed  Routing to Dr. Erin Fulling as Juluis Rainier

## 2021-04-22 ENCOUNTER — Other Ambulatory Visit: Payer: Self-pay | Admitting: Internal Medicine

## 2021-04-22 LAB — SARS CORONAVIRUS 2 (TAT 6-24 HRS): SARS Coronavirus 2: NEGATIVE

## 2021-04-26 ENCOUNTER — Encounter (HOSPITAL_COMMUNITY)
Admission: RE | Disposition: A | Discharge status: Home / Self Care | Payer: Self-pay | Source: Ambulatory Visit | Attending: Pulmonary Disease

## 2021-04-26 ENCOUNTER — Ambulatory Visit (HOSPITAL_COMMUNITY)
Admission: RE | Admit: 2021-04-26 | Discharge: 2021-04-26 | Disposition: A | Discharge status: Home / Self Care | Payer: 59 | Source: Ambulatory Visit | Attending: Pulmonary Disease | Admitting: Pulmonary Disease

## 2021-04-26 ENCOUNTER — Other Ambulatory Visit: Payer: Self-pay

## 2021-04-26 ENCOUNTER — Telehealth: Payer: Self-pay | Admitting: Pulmonary Disease

## 2021-04-26 ENCOUNTER — Encounter (HOSPITAL_COMMUNITY): Payer: Self-pay | Admitting: Pulmonary Disease

## 2021-04-26 ENCOUNTER — Ambulatory Visit (HOSPITAL_COMMUNITY): Payer: 59 | Admitting: Anesthesiology

## 2021-04-26 DIAGNOSIS — D869 Sarcoidosis, unspecified: Secondary | ICD-10-CM | POA: Diagnosis not present

## 2021-04-26 DIAGNOSIS — R0602 Shortness of breath: Secondary | ICD-10-CM | POA: Insufficient documentation

## 2021-04-26 DIAGNOSIS — G4733 Obstructive sleep apnea (adult) (pediatric): Secondary | ICD-10-CM | POA: Insufficient documentation

## 2021-04-26 DIAGNOSIS — Z79899 Other long term (current) drug therapy: Secondary | ICD-10-CM | POA: Diagnosis not present

## 2021-04-26 DIAGNOSIS — E119 Type 2 diabetes mellitus without complications: Secondary | ICD-10-CM | POA: Diagnosis not present

## 2021-04-26 DIAGNOSIS — Z87891 Personal history of nicotine dependence: Secondary | ICD-10-CM | POA: Diagnosis not present

## 2021-04-26 DIAGNOSIS — Z8249 Family history of ischemic heart disease and other diseases of the circulatory system: Secondary | ICD-10-CM | POA: Insufficient documentation

## 2021-04-26 DIAGNOSIS — Z7951 Long term (current) use of inhaled steroids: Secondary | ICD-10-CM | POA: Diagnosis not present

## 2021-04-26 DIAGNOSIS — Z833 Family history of diabetes mellitus: Secondary | ICD-10-CM | POA: Insufficient documentation

## 2021-04-26 DIAGNOSIS — Z841 Family history of disorders of kidney and ureter: Secondary | ICD-10-CM | POA: Diagnosis not present

## 2021-04-26 DIAGNOSIS — Z823 Family history of stroke: Secondary | ICD-10-CM | POA: Insufficient documentation

## 2021-04-26 DIAGNOSIS — Z902 Acquired absence of lung [part of]: Secondary | ICD-10-CM | POA: Diagnosis not present

## 2021-04-26 DIAGNOSIS — J479 Bronchiectasis, uncomplicated: Secondary | ICD-10-CM | POA: Diagnosis not present

## 2021-04-26 DIAGNOSIS — J470 Bronchiectasis with acute lower respiratory infection: Secondary | ICD-10-CM

## 2021-04-26 HISTORY — PX: VIDEO BRONCHOSCOPY: SHX5072

## 2021-04-26 HISTORY — PX: BRONCHIAL WASHINGS: SHX5105

## 2021-04-26 LAB — BODY FLUID CELL COUNT WITH DIFFERENTIAL
Eos, Fluid: 1 %
Lymphs, Fluid: 0 %
Monocyte-Macrophage-Serous Fluid: 0 % — ABNORMAL LOW (ref 50–90)
Neutrophil Count, Fluid: 99 % — ABNORMAL HIGH (ref 0–25)
Total Nucleated Cell Count, Fluid: 4720 cu mm — ABNORMAL HIGH (ref 0–1000)

## 2021-04-26 LAB — GLUCOSE, CAPILLARY: Glucose-Capillary: 96 mg/dL (ref 70–99)

## 2021-04-26 SURGERY — VIDEO BRONCHOSCOPY WITHOUT FLUORO
Anesthesia: General

## 2021-04-26 MED ORDER — FENTANYL CITRATE (PF) 100 MCG/2ML IJ SOLN
INTRAMUSCULAR | Status: DC | PRN
  Administered 2021-04-26 (×2): 25 ug via INTRAVENOUS
  Administered 2021-04-26: 50 ug via INTRAVENOUS

## 2021-04-26 MED ORDER — PROPOFOL 10 MG/ML IV BOLUS
INTRAVENOUS | Status: DC | PRN
Start: 2021-04-26 — End: 2021-04-26
  Administered 2021-04-26: 200 mg via INTRAVENOUS

## 2021-04-26 MED ORDER — SUGAMMADEX SODIUM 200 MG/2ML IV SOLN
INTRAVENOUS | Status: DC | PRN
  Administered 2021-04-26: 200 mg via INTRAVENOUS

## 2021-04-26 MED ORDER — LACTATED RINGERS IV SOLN
INTRAVENOUS | Status: DC
  Administered 2021-04-26: 1000 mL via INTRAVENOUS

## 2021-04-26 MED ORDER — LIDOCAINE HCL (CARDIAC) PF 100 MG/5ML IV SOSY
PREFILLED_SYRINGE | INTRAVENOUS | Status: DC | PRN
Start: 2021-04-26 — End: 2021-04-26
  Administered 2021-04-26: 100 mg via INTRAVENOUS

## 2021-04-26 MED ORDER — ROCURONIUM BROMIDE 100 MG/10ML IV SOLN
INTRAVENOUS | Status: DC | PRN
  Administered 2021-04-26: 20 mg via INTRAVENOUS

## 2021-04-26 MED ORDER — ONDANSETRON HCL 4 MG/2ML IJ SOLN
INTRAMUSCULAR | Status: DC | PRN
  Administered 2021-04-26: 4 mg via INTRAVENOUS

## 2021-04-26 MED ORDER — SUCCINYLCHOLINE CHLORIDE 200 MG/10ML IV SOSY
PREFILLED_SYRINGE | INTRAVENOUS | Status: DC | PRN
  Administered 2021-04-26: 120 mg via INTRAVENOUS

## 2021-04-26 MED ORDER — PROPOFOL 10 MG/ML IV BOLUS
INTRAVENOUS | Status: AC
  Filled 2021-04-26: qty 20

## 2021-04-26 MED ORDER — FENTANYL CITRATE (PF) 100 MCG/2ML IJ SOLN
INTRAMUSCULAR | Status: AC
  Filled 2021-04-26: qty 2

## 2021-04-26 MED ORDER — PHENYLEPHRINE HCL (PRESSORS) 10 MG/ML IV SOLN
INTRAVENOUS | Status: DC | PRN
  Administered 2021-04-26: 80 ug via INTRAVENOUS

## 2021-04-26 NOTE — Transfer of Care (Signed)
Immediate Anesthesia Transfer of Care Note  Patient: Jackson Hatfield  Procedure(s) Performed: FLEX BRONCHOSCOPY WITHOUT FLUORO BRONCHIAL WASHINGS  Patient Location: PACU  Anesthesia Type:General  Level of Consciousness: awake, alert  and oriented  Airway & Oxygen Therapy: Patient Spontanous Breathing  Post-op Assessment: Report given to RN and Post -op Vital signs reviewed and stable  Post vital signs: Reviewed and stable  Last Vitals:  Vitals Value Taken Time  BP 124/54 04/26/21 1315  Temp    Pulse 84 04/26/21 1317  Resp 20 04/26/21 1317  SpO2 89 % 04/26/21 1317  Vitals shown include unvalidated device data.  Last Pain:  Vitals:   04/26/21 1122  TempSrc: Oral  PainSc: 0-No pain         Complications: No notable events documented.

## 2021-04-26 NOTE — Anesthesia Procedure Notes (Addendum)
Procedure Name: Intubation Date/Time: 04/26/2021 12:45 PM Performed by: Hedda Slade, CRNA Pre-anesthesia Checklist: Patient identified, Patient being monitored, Timeout performed, Emergency Drugs available and Suction available Patient Re-evaluated:Patient Re-evaluated prior to induction Oxygen Delivery Method: Circle system utilized Preoxygenation: Pre-oxygenation with 100% oxygen Induction Type: IV induction Ventilation: Mask ventilation without difficulty Laryngoscope Size: Mac and 4 Grade View: Grade II Tube type: Oral Tube size: 7.5 mm Number of attempts: 1 Airway Equipment and Method: Stylet Placement Confirmation: ETT inserted through vocal cords under direct vision, positive ETCO2 and breath sounds checked- equal and bilateral Secured at: 22 cm Tube secured with: Tape Dental Injury: Teeth and Oropharynx as per pre-operative assessment

## 2021-04-26 NOTE — Op Note (Signed)
Bronchoscopy Procedure Note  Jackson Hatfield  [id number]  [birth year]  Date:04/26/21  Time:1:08 PM   Provider Performing:Junie Engram B Jubal Rademaker   Procedure(s):  Flexible bronchoscopy with bronchial alveolar lavage (78675)  Indication(s) Bronchiectasis  Consent Risks of the procedure as well as the alternatives and risks of each were explained to the patient and/or caregiver.  Consent for the procedure was obtained and is signed in the bedside chart  Anesthesia General   Time Out Verified patient identification, verified procedure, site/side was marked, verified correct patient position, special equipment/implants available, medications/allergies/relevant history reviewed, required imaging and test results available.   Sterile Technique Usual hand hygiene, masks, gowns, and gloves were used   Procedure Description Bronchoscope advanced through endotracheal tube and into airway.  Airways were examined down to subsegmental level with findings noted below.   Following diagnostic evaluation, BAL(s) performed in Left Upper Lobe and lingula with normal saline and return of 20-35m fluid  Findings:  - Right bronchial tree is unremarkable - Copiuos amounts of purulent secretions within the left main stem bronchus - Bronchial wahing performed and secretions suctioned to clarity of left main stem bronchus -Left bronchial tree with left upper lobe and lingula present. There was a broncholith noted in the medial or lateral airway of the lingula with some occlusion of the airway. The broncholith appeared fixated to the bronchial wall and was not free moving. - BALs performed in the LUL and Lingular segments with purulent return.   Complications/Tolerance None; patient tolerated the procedure well. Chest X-ray is not needed post procedure.   EBL Minimal   Specimen(s) Bronchial washing for culture BAL for cytology BAL for cell count, respiratory culture, fungal culture and AFB smear  and culture

## 2021-04-26 NOTE — Anesthesia Preprocedure Evaluation (Addendum)
Anesthesia Evaluation  Patient identified by MRN, date of birth, ID band Patient awake    Reviewed: Allergy & Precautions, NPO status , Patient's Chart, lab work & pertinent test results  Airway Mallampati: II  TM Distance: >3 FB Neck ROM: Full    Dental no notable dental hx.    Pulmonary sleep apnea ,  sarcoidosis  CTA on R  Rhonchi on L  breath sounds clear to auscultation       Cardiovascular hypertension, Pt. on medications Normal cardiovascular exam Rhythm:Regular Rate:Normal     Neuro/Psych negative neurological ROS  negative psych ROS   GI/Hepatic Neg liver ROS, GERD  ,  Endo/Other  diabetes, Type 2  Renal/GU negative Renal ROS  negative genitourinary   Musculoskeletal negative musculoskeletal ROS (+)   Abdominal   Peds negative pediatric ROS (+)  Hematology negative hematology ROS (+)   Anesthesia Other Findings   Reproductive/Obstetrics negative OB ROS                            Anesthesia Physical Anesthesia Plan  ASA: 3  Anesthesia Plan: MAC   Post-op Pain Management:    Induction: Intravenous  PONV Risk Score and Plan: 1  Airway Management Planned: Simple Face Mask  Additional Equipment:   Intra-op Plan:   Post-operative Plan:   Informed Consent: I have reviewed the patients History and Physical, chart, labs and discussed the procedure including the risks, benefits and alternatives for the proposed anesthesia with the patient or authorized representative who has indicated his/her understanding and acceptance.     Dental advisory given  Plan Discussed with: CRNA and Surgeon  Anesthesia Plan Comments:         Anesthesia Quick Evaluation

## 2021-04-26 NOTE — Telephone Encounter (Signed)
Patient here for bronchoscopy today and he reports not receiving his nebulizer machine yet. Can we look into this and let the patient know what he needs to do in order get the necessary equipment.   Thanks, Jackson Hatfield

## 2021-04-26 NOTE — Interval H&P Note (Signed)
History and Physical Interval Note:  04/26/2021 11:57 AM  Jackson Hatfield  has presented today for surgery, with the diagnosis of BRONCHIECTASIS.  The various methods of treatment have been discussed with the patient and family. After consideration of risks, benefits and other options for treatment, the patient has consented to  Procedure(s): FLEX BRONCHOSCOPY WITHOUT FLUORO (N/A) as a surgical intervention.  The patient's history has been reviewed, patient examined, no change in status, stable for surgery.  I have reviewed the patient's chart and labs.  Questions were answered to the patient's satisfaction.     [name]

## 2021-04-26 NOTE — Telephone Encounter (Signed)
I have sent a community staff message to [place] with [employer] To find out about the neb machine.

## 2021-04-26 NOTE — Discharge Instructions (Signed)
Please call the office if you notice blood in your sputum Develop fever 24 hours after procedure We will call you with the results and antibiotic orders once we have the results.

## 2021-04-26 NOTE — Anesthesia Postprocedure Evaluation (Signed)
Anesthesia Post Note  Patient: Jackson Hatfield  Procedure(s) Performed: FLEX BRONCHOSCOPY WITHOUT FLUORO BRONCHIAL WASHINGS     Patient location during evaluation: PACU Anesthesia Type: General Level of consciousness: awake and alert Pain management: pain level controlled Vital Signs Assessment: post-procedure vital signs reviewed and stable Respiratory status: spontaneous breathing, nonlabored ventilation, respiratory function stable and patient connected to nasal cannula oxygen Cardiovascular status: blood pressure returned to baseline and stable Postop Assessment: no apparent nausea or vomiting Anesthetic complications: no   No notable events documented.  Last Vitals:  Vitals:   04/26/21 1313 04/26/21 1323  BP: (!) 124/54 (!) 111/49  Pulse: 82 79  Resp: 16 11  Temp:  36.6 C  SpO2: 92% 95%    Last Pain:  Vitals:   04/26/21 1323  TempSrc: Oral  PainSc: 0-No pain                 Honest Safranek S

## 2021-04-27 NOTE — Telephone Encounter (Signed)
I received a message back from [facility] with [employer] and she stated that she was going to call the pt since it stated that the nebulizer was delivered to the pt yesterday.   I have called and LM on VM for the pt and advised him to call the office on Monday if anything further is needed.  Will close out of the message for now.

## 2021-04-28 ENCOUNTER — Encounter (HOSPITAL_COMMUNITY): Payer: Self-pay | Admitting: Pulmonary Disease

## 2021-04-28 LAB — CULTURE, RESPIRATORY W GRAM STAIN
Gram Stain: NONE SEEN
Gram Stain: NONE SEEN

## 2021-04-29 ENCOUNTER — Telehealth: Payer: Self-pay | Admitting: Pulmonary Disease

## 2021-04-29 DIAGNOSIS — J479 Bronchiectasis, uncomplicated: Secondary | ICD-10-CM

## 2021-04-29 MED ORDER — DOXYCYCLINE HYCLATE 100 MG PO TABS: 100.0000 mg | ORAL_TABLET | Freq: Two times a day (BID) | ORAL | 0 refills | Status: DC

## 2021-04-29 NOTE — Telephone Encounter (Signed)
Please let patient know that he has haemophilus influenzae growing from his cultures which is a common bacteria that can cause pneumonia.   Please send in prescription for doxycyline 100mg  twice daily for 14 days.   It is important that patient use the nebulizer treatments and flutter valve therapy religiously in order to clear out the airways with bronchiectasis that contain mucous.   Thanks, Wille Glaser

## 2021-04-29 NOTE — Telephone Encounter (Signed)
Spoke with the pt and notified of results  He verbalized understanding  He states never was given flutter valve  I have placed one up front and he is aware that someone will need to show him how to use this and have him sign  Placed up front

## 2021-04-30 LAB — CYTOLOGY - NON PAP

## 2021-05-02 LAB — ACID FAST SMEAR (AFB, MYCOBACTERIA): Acid Fast Smear: NEGATIVE

## 2021-05-09 ENCOUNTER — Encounter: Payer: Self-pay | Admitting: *Deleted

## 2021-05-09 ENCOUNTER — Telehealth: Payer: Self-pay | Admitting: Pulmonary Disease

## 2021-05-09 NOTE — Telephone Encounter (Signed)
Please schedule patient for follow up of his bronchiectasis after his recent bronchoscopy over the next week or two based on availability with me.   Thanks,  Wille Glaser

## 2021-05-09 NOTE — Telephone Encounter (Signed)
I have scheduled the pt for an appt on 11/30 with JD.  I have sent and email to the pt to see if this date and time will work.

## 2021-05-13 NOTE — Telephone Encounter (Signed)
Will forward to JD to make him aware.

## 2021-05-22 ENCOUNTER — Ambulatory Visit: Payer: 59 | Admitting: Pulmonary Disease

## 2021-05-22 NOTE — Progress Notes (Deleted)
Synopsis: Referred in October 2022 for abnormal CT Chest by Collier Flowers, PA  Subjective:   PATIENT ID: Jackson Hatfield GENDER: male DOB: Nov 02, 1970, MRN: [id number]   HPI  No chief complaint on file.  Jackson Hatfield is a 50 year old male, former smoker with history of obesity, obstructive sleep apnea on CPAP, sarcoidosis and status post left lower lobectomy in 2003 for abnormal nodule who returns to pulmonary clinic for bronchiectasis.   He had bronchoscopy on 04/26/21 with BAL samples obtained. Cultures grew hemophilus influenzae and was provided 14 days of doxycycline. He has been given flutter valve and nebulizer machine.   OV 04/11/21 He recently went to the ER on 03/31/2021 for shortness of breath, fatigue, weight loss, fever and chills.  Laboratory evaluation was unremarkable including CBC and BMP.  CT chest scan was obtained which showed mildly enlarged mediastinal and hilar lymph nodes with some containing central calcifications consistent with his history of sarcoidosis.  There is notable left lower lobectomy.  Scarring of the left upper lobe.  There is central peribronchial thickening especially of the left lung bronchiectasis of the left upper lobe with some dilated airways containing fluid/mucus.  Tree-in-bud opacities in the left upper lobe.  Patient reports a 25 pound weight loss since March.  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  He does not have any wheezing and denies any sputum production.  He reports being diagnosed with cystic fibrosis when he was younger but has not followed up with any cystic fibrosis specialist most of his life.  He is using CPAP nightly for his obstructive sleep apnea.  He underwent left lower lobectomy in 2003 when he used to live in [place] and was told  that he has a cyst.  When he moved to [place] he underwent further evaluation including a mediastinoscopy and was diagnosed with sarcoidosis in 2006.  Past Medical History:  Diagnosis Date   Blood transfusion without reported diagnosis    many years ago   Diabetes mellitus without complication ([place])    Dyspnea    exerting self,ie: running   GERD (gastroesophageal reflux disease)    ? medicine related   Hyperlipidemia    Hypertension    Pneumonia    Pre-diabetes    Sarcoidosis    Sleep apnea    wears cpap     Family History  Problem Relation Age of Onset   COPD Mother    Alcohol abuse Father    Arthritis Father    Diabetes Father    Heart disease Father    Hyperlipidemia Father    Kidney disease Father    Diabetes Brother    Stroke Brother    Alcohol abuse Brother    Diabetes Brother    Drug abuse Brother    Stomach cancer Paternal Uncle    Colon cancer Neg Hx    Colon polyps Neg Hx    Esophageal cancer Neg Hx    Rectal cancer Neg Hx      Social History   Socioeconomic History   Marital status: Divorced    Spouse name: Not on file   Number of children: Not on file   Years of education: Not on file   Highest education level: Not on file  Occupational History   Not on file  Tobacco Use   Smoking status: Never   Smokeless tobacco: Never  Vaping Use   Vaping Use: Never used  Substance and Sexual Activity   Alcohol use: Yes    Comment: ocassionally   Drug use: No   Sexual activity: Not on file  Other Topics Concern   Not on file  Social History Narrative   Not on file   Social Determinants of Health   Financial Resource Strain: Not on file  Food Insecurity: Not on file  Transportation Needs: Not on file  Physical Activity: Not on file  Stress: Not on file  Social Connections: Not on file  Intimate Partner Violence: Not on file     Allergies  Allergen Reactions   Naproxen Hives   Penicillins Hives     Outpatient Medications Prior to Visit   Medication Sig Dispense Refill   acetaminophen (TYLENOL) 500 MG tablet Take 1,000 mg by mouth every 6 (six) hours as needed for moderate pain or fever.     albuterol (PROVENTIL) (2.5 MG/3ML) 0.083% nebulizer solution Take 3 mLs (2.5 mg total) by nebulization 3 (three) times daily as needed for wheezing or shortness of breath. 30 mL 5   albuterol (VENTOLIN HFA) 108 (90 Base) MCG/ACT inhaler Inhale 2 puffs into the lungs every 4 (four) hours as needed for wheezing or shortness of breath. 1 each 2   amLODipine (NORVASC) 10 MG tablet Take 10 mg by mouth daily.     blood glucose meter kit and supplies Check blood sugar 2 times daily (E11.9). 1 each 0   doxycycline (VIBRA-TABS) 100 MG tablet Take 1 tablet (100 mg total) by mouth 2 (two) times daily. 14 tablet 0   fluticasone (FLONASE) 50 MCG/ACT nasal spray Place 1 spray into both nostrils at bedtime.     losartan (COZAAR) 100 MG tablet TAKE 1 TABLET BY MOUTH EVERY DAY 90 tablet 1   metFORMIN (GLUCOPHAGE) 500 MG tablet TAKE 1 TABLET (500 MG TOTAL) BY MOUTH DAILY. 90 tablet 1   Multiple Vitamins-Minerals (MENS ONE DAILY PO) Take 1 tablet by mouth daily.      tamsulosin (FLOMAX) 0.4 MG CAPS capsule TAKE 1 CAPSULE BY MOUTH EVERY DAY 90 capsule 0   Tiotropium Bromide-Olodaterol (STIOLTO RESPIMAT) 2.5-2.5 MCG/ACT AERS Inhale 2 puffs into the lungs daily. (Patient taking differently: Inhale 1 puff into the lungs 3 (three) times daily as needed (shortness of breath).) 8 g 0   No facility-administered medications prior to visit.    Review of Systems  Constitutional:  Positive for chills, fever, malaise/fatigue and weight loss.  HENT:  Negative for congestion, sinus pain and sore throat.   Eyes: Negative.   Respiratory:  Positive for shortness of breath. Negative for cough, hemoptysis, sputum production and wheezing.   Cardiovascular:  Negative for chest pain, palpitations, orthopnea, claudication and leg swelling.  Gastrointestinal:  Negative for  abdominal pain, heartburn, nausea and vomiting.  Genitourinary: Negative.   Musculoskeletal:  Negative for joint pain and myalgias.  Skin:  Negative for rash.  Neurological:  Negative for weakness.  Endo/Heme/Allergies: Negative.   Psychiatric/Behavioral: Negative.       Objective:   There were no vitals filed for this visit.    Physical Exam Constitutional:      General: He is not in acute distress.    Appearance: Normal appearance. He is obese.  HENT:     Head: Normocephalic and atraumatic.  Eyes:     Extraocular Movements: Extraocular movements intact.  Conjunctiva/sclera: Conjunctivae normal.     Pupils: Pupils are equal, round, and reactive to light.  Cardiovascular:     Rate and Rhythm: Normal rate and regular rhythm.     Pulses: Normal pulses.     Heart sounds: Normal heart sounds. No murmur heard. Pulmonary:     Effort: Pulmonary effort is normal.     Breath sounds: Decreased breath sounds present. No wheezing, rhonchi or rales.  Abdominal:     General: Bowel sounds are normal.     Palpations: Abdomen is soft.  Musculoskeletal:     Right lower leg: No edema.     Left lower leg: No edema.  Lymphadenopathy:     Cervical: No cervical adenopathy.  Skin:    General: Skin is warm and dry.  Neurological:     General: No focal deficit present.     Mental Status: He is alert.  Psychiatric:        Mood and Affect: Mood normal.        Behavior: Behavior normal.        Thought Content: Thought content normal.        Judgment: Judgment normal.   CBC    Component Value Date/Time   WBC 8.4 04/04/2021 1443   RBC 4.41 04/04/2021 1443   HGB 12.6 (L) 04/04/2021 1443   HCT 37.8 (L) 04/04/2021 1443   PLT 380.0 04/04/2021 1443   MCV 85.7 04/04/2021 1443   MCH 29.0 03/31/2021 1116   MCHC 33.3 04/04/2021 1443   RDW 14.2 04/04/2021 1443   LYMPHSABS 1.7 04/04/2021 1443   MONOABS 0.7 04/04/2021 1443   EOSABS 0.3 04/04/2021 1443   BASOSABS 0.1 04/04/2021 1443   BMP  Latest Ref Rng & Units 04/04/2021 03/31/2021 12/28/2020  Glucose 70 - 99 mg/dL 121(H) 124(H) 103(H)  BUN 6 - 23 mg/dL _0 Creatinine 0.40 - 1.50 mg/dL 0.70 0.67 0.75  Sodium 135 - 145 mEq/L 139 137 135  Potassium 3.5 - 5.1 mEq/L 4.0 4.2 4.2  Chloride 96 - 112 mEq/L 104 102 101  CO2 19 - 32 mEq/L _1 Calcium 8.4 - 10.5 mg/dL 9.5 9.2 8.9   Chest imaging: CTA Chest 03/31/21 -No evidence of pulmonary embolism. -Numerous normal sized to mildly enlarged mediastinal and hilar lymph nodes consistent with history of sarcoidosis. -Post LEFT lower lobectomy.  -Scattered pulmonary nodular foci bilaterally, could be related to sarcoidosis, stability uncertain; if patient has any prior outside CT imaging, these would be of benefit in establishing stability of these findings. -Central bronchitic changes.  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   PFT: No flowsheet data found.  Labs:  Path:  Echo:  Heart Catheterization:  Assessment & Plan:   No diagnosis found.  Discussion: Jackson Hatfield is a 50 year old male, former smoker with history of obesity, obstructive sleep apnea on CPAP, sarcoidosis and status post left lower lobectomy in 2003 for abnormal nodule who is referred to pulmonary clinic for abnormal CT Chest scan.   Patient has significant tubular bronchiectasis with mucoid impaction of his left lung with tree-in-bud opacities concerning for infectious process.  Given that he is unable to provide sputum samples for respiratory cultures and AFB testing we will schedule him for bronchoscopy on 10/25 in order to obtain BAL samples for respiratory, AFB and fungal cultures.  We will also send a sample for cytology.  For  treatment of his bronchiectasis we will order him a nebulizer machine and albuterol nebulizer solution.  He is to use the nebulizer  treatments 2-3 times per day followed by flutter valve therapy for bronchial hygiene and mucus clearance.  We will also start him on Stiolto 2 puffs once daily and monitor for any improvement in his shortness of breath.  Once we have culture results we will plan treatment accordingly with antibiotic therapy.  Once he has recovered from this current issue with his bronchiectasis we will consider further evaluation for cystic fibrosis based on his reported history.  Freda Jackson, MD [facility] Office: [phone]   Current Outpatient Medications:    acetaminophen (TYLENOL) 500 MG tablet, Take 1,000 mg by mouth every 6 (six) hours as needed for moderate pain or fever., Disp: , Rfl:    albuterol (PROVENTIL) (2.5 MG/3ML) 0.083% nebulizer solution, Take 3 mLs (2.5 mg total) by nebulization 3 (three) times daily as needed for wheezing or shortness of breath., Disp: 30 mL, Rfl: 5   albuterol (VENTOLIN HFA) 108 (90 Base) MCG/ACT inhaler, Inhale 2 puffs into the lungs every 4 (four) hours as needed for wheezing or shortness of breath., Disp: 1 each, Rfl: 2   amLODipine (NORVASC) 10 MG tablet, Take 10 mg by mouth daily., Disp: , Rfl:    blood glucose meter kit and supplies, Check blood sugar 2 times daily (E11.9)., Disp: 1 each, Rfl: 0   doxycycline (VIBRA-TABS) 100 MG tablet, Take 1 tablet (100 mg total) by mouth 2 (two) times daily., Disp: 14 tablet, Rfl: 0   fluticasone (FLONASE) 50 MCG/ACT nasal spray, Place 1 spray into both nostrils at bedtime., Disp: , Rfl:    losartan (COZAAR) 100 MG tablet, TAKE 1 TABLET BY MOUTH EVERY DAY, Disp: 90 tablet, Rfl: 1   metFORMIN (GLUCOPHAGE) 500 MG tablet, TAKE 1 TABLET (500 MG TOTAL) BY MOUTH DAILY., Disp: 90 tablet, Rfl: 1   Multiple Vitamins-Minerals (MENS ONE DAILY PO), Take 1 tablet by mouth daily. , Disp: , Rfl:    tamsulosin (FLOMAX) 0.4 MG CAPS capsule, TAKE 1 CAPSULE BY MOUTH EVERY DAY, Disp: 90 capsule, Rfl: 0   Tiotropium  Bromide-Olodaterol (STIOLTO RESPIMAT) 2.5-2.5 MCG/ACT AERS, Inhale 2 puffs into the lungs daily. (Patient taking differently: Inhale 1 puff into the lungs 3 (three) times daily as needed (shortness of breath).), Disp: 8 g, Rfl: 0

## 2021-05-30 LAB — FUNGUS CULTURE WITH STAIN

## 2021-05-30 LAB — FUNGAL ORGANISM REFLEX

## 2021-05-30 LAB — FUNGUS CULTURE RESULT

## 2021-05-31 ENCOUNTER — Ambulatory Visit: Payer: 59 | Admitting: Pulmonary Disease

## 2021-05-31 ENCOUNTER — Other Ambulatory Visit: Payer: Self-pay

## 2021-05-31 ENCOUNTER — Encounter: Payer: Self-pay | Admitting: Pulmonary Disease

## 2021-05-31 ENCOUNTER — Telehealth: Payer: Self-pay | Admitting: Medical

## 2021-05-31 ENCOUNTER — Ambulatory Visit (INDEPENDENT_AMBULATORY_CARE_PROVIDER_SITE_OTHER): Payer: 59 | Admitting: Pulmonary Disease

## 2021-05-31 VITALS — BP 146/74 | HR 65 | Ht 74.0 in | Wt 288.0 lb

## 2021-05-31 DIAGNOSIS — N401 Enlarged prostate with lower urinary tract symptoms: Secondary | ICD-10-CM

## 2021-05-31 DIAGNOSIS — J9809 Other diseases of bronchus, not elsewhere classified: Secondary | ICD-10-CM | POA: Diagnosis not present

## 2021-05-31 DIAGNOSIS — J479 Bronchiectasis, uncomplicated: Secondary | ICD-10-CM

## 2021-05-31 DIAGNOSIS — R3914 Feeling of incomplete bladder emptying: Secondary | ICD-10-CM

## 2021-05-31 MED ORDER — AMOXICILLIN-POT CLAVULANATE 875-125 MG PO TABS: 1.0000 | ORAL_TABLET | Freq: Two times a day (BID) | ORAL | 0 refills | Status: DC

## 2021-05-31 MED ORDER — TAMSULOSIN HCL 0.4 MG PO CAPS: 0.4000 mg | ORAL_CAPSULE | Freq: Every day | ORAL | 0 refills | Status: DC

## 2021-05-31 NOTE — Progress Notes (Signed)
Synopsis: Referred in October 2022 for abnormal CT Chest by Collier Flowers, PA  Subjective:   PATIENT ID: Jackson Hatfield GENDER: male DOB: Jun 13, 1971, MRN: [id number]   HPI  Chief Complaint  Patient presents with   Follow-up    2 mo f/u. States he still gets SOB with exertion. Has been using the flutter valve but it is not working.    Jackson Hatfield is a 50 year old male, former smoker with history of obesity, obstructive sleep apnea on CPAP, sarcoidosis and status post left lower lobectomy in 2003 for abnormal nodule who returns to pulmonary clinic for bronchiectasis.   Patient underwent bronchoscopy with BAL on 04/26/2021 with cultures growing significant amounts of haemophilus influenzae.  Bronchoscopy findings were also significant for broncholith involvement at the bifurcation of the left upper lobe and lingular segments.  There is significant narrowing of the the airway due to the broncholith.  To date fungal and AFB cultures remain negative.  He was treated with 14 days of doxycycline with initial improvement in his night sweats.  Since completing this course of antibiotics he reports he has intermittent mild night sweats.  He continues to have cough and shortness of breath.  He stopped using Stiolto 2 puffs daily once he received albuterol nebulizer treatments which she has been doing twice daily.  He reports that the flutter valve is not providing benefit to mucus clearance.  OV 04/11/21 He recently went to the ER on 03/31/2021 for shortness of breath, fatigue, weight loss, fever and chills.  Laboratory evaluation was unremarkable including CBC and BMP.  CT chest scan was obtained which showed mildly enlarged mediastinal and hilar lymph nodes with some containing central calcifications consistent with his history of sarcoidosis.  There is notable left lower lobectomy.  Scarring of the left upper lobe.  There is central peribronchial thickening especially of the left lung bronchiectasis  of the left upper lobe with some dilated airways containing fluid/mucus.  Tree-in-bud opacities in the left upper lobe.  Patient reports a 25 pound weight loss since March.  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  He does not have any wheezing and denies any sputum production.  He reports being diagnosed with cystic fibrosis when he was younger but has not followed up with any cystic fibrosis specialist most of his life.  He is using CPAP nightly for his obstructive sleep apnea.  He underwent left lower lobectomy in 2003 when he used to live in [place] and was told that he has a cyst.  When he moved to [place] he underwent further evaluation including a mediastinoscopy and was diagnosed with sarcoidosis in 2006.  Past Medical History:  Diagnosis Date   Blood transfusion without reported diagnosis    many years ago   Diabetes mellitus without complication ([place])    Dyspnea    exerting self,ie: running   GERD (gastroesophageal reflux disease)    ? medicine related   Hyperlipidemia    Hypertension    Pneumonia    Pre-diabetes    Sarcoidosis    Sleep apnea    wears cpap     Family History  Problem Relation Age of Onset   COPD Mother    Alcohol abuse Father    Arthritis Father    Diabetes Father  Heart disease Father    Hyperlipidemia Father    Kidney disease Father    Diabetes Brother    Stroke Brother    Alcohol abuse Brother    Diabetes Brother    Drug abuse Brother    Stomach cancer Paternal Uncle    Colon cancer Neg Hx    Colon polyps Neg Hx    Esophageal cancer Neg Hx    Rectal cancer Neg Hx      Social History   Socioeconomic History   Marital status: Divorced    Spouse name: Not on file   Number of children: Not on file   Years of education: Not on file   Highest  education level: Not on file  Occupational History   Not on file  Tobacco Use   Smoking status: Never   Smokeless tobacco: Never  Vaping Use   Vaping Use: Never used  Substance and Sexual Activity   Alcohol use: Yes    Comment: ocassionally   Drug use: No   Sexual activity: Not on file  Other Topics Concern   Not on file  Social History Narrative   Not on file   Social Determinants of Health   Financial Resource Strain: Not on file  Food Insecurity: Not on file  Transportation Needs: Not on file  Physical Activity: Not on file  Stress: Not on file  Social Connections: Not on file  Intimate Partner Violence: Not on file     Allergies  Allergen Reactions   Naproxen Hives   Penicillins Hives     Outpatient Medications Prior to Visit  Medication Sig Dispense Refill   acetaminophen (TYLENOL) 500 MG tablet Take 1,000 mg by mouth every 6 (six) hours as needed for moderate pain or fever.     albuterol (PROVENTIL) (2.5 MG/3ML) 0.083% nebulizer solution Take 3 mLs (2.5 mg total) by nebulization 3 (three) times daily as needed for wheezing or shortness of breath. 30 mL 5   albuterol (VENTOLIN HFA) 108 (90 Base) MCG/ACT inhaler Inhale 2 puffs into the lungs every 4 (four) hours as needed for wheezing or shortness of breath. 1 each 2   amLODipine (NORVASC) 10 MG tablet Take 10 mg by mouth daily.     blood glucose meter kit and supplies Check blood sugar 2 times daily (E11.9). 1 each 0   fluticasone (FLONASE) 50 MCG/ACT nasal spray Place 1 spray into both nostrils at bedtime.     losartan (COZAAR) 100 MG tablet TAKE 1 TABLET BY MOUTH EVERY DAY 90 tablet 1   metFORMIN (GLUCOPHAGE) 500 MG tablet TAKE 1 TABLET (500 MG TOTAL) BY MOUTH DAILY. 90 tablet 1   Multiple Vitamins-Minerals (MENS ONE DAILY PO) Take 1 tablet by mouth daily.      tamsulosin (FLOMAX) 0.4 MG CAPS capsule Take 1 capsule (0.4 mg total) by mouth daily. 90 capsule 0   doxycycline (VIBRA-TABS) 100 MG tablet Take 1 tablet  (100 mg total) by mouth 2 (two) times daily. 14 tablet 0   Tiotropium Bromide-Olodaterol (STIOLTO RESPIMAT) 2.5-2.5 MCG/ACT AERS Inhale 2 puffs into the lungs daily. (Patient taking differently: Inhale 1 puff into the lungs 3 (three) times daily as needed (shortness of breath).) 8 g 0   No facility-administered medications prior to visit.    Review of Systems  Constitutional:  Positive for chills and malaise/fatigue. Negative for fever and weight loss.  HENT:  Negative for congestion, sinus pain and sore throat.   Eyes: Negative.   Respiratory:  Positive  for cough, sputum production and shortness of breath. Negative for hemoptysis and wheezing.   Cardiovascular:  Negative for chest pain, palpitations, orthopnea, claudication and leg swelling.  Gastrointestinal:  Negative for abdominal pain, heartburn, nausea and vomiting.  Genitourinary: Negative.   Musculoskeletal:  Negative for joint pain and myalgias.  Skin:  Negative for rash.  Neurological:  Negative for weakness.  Endo/Heme/Allergies: Negative.   Psychiatric/Behavioral: Negative.     Objective:   Vitals:   05/31/21 1527  BP: (!) 146/74  Pulse: 65  SpO2: 97%  Weight: 288 lb (130.6 kg)  Height: _0  (1.88 m)     Physical Exam Constitutional:      General: He is not in acute distress.    Appearance: Normal appearance. He is obese.  HENT:     Head: Normocephalic and atraumatic.  Eyes:     Conjunctiva/sclera: Conjunctivae normal.  Cardiovascular:     Rate and Rhythm: Normal rate and regular rhythm.     Pulses: Normal pulses.     Heart sounds: Normal heart sounds. No murmur heard. Pulmonary:     Effort: Pulmonary effort is normal.     Breath sounds: Decreased breath sounds and rales (left base) present. No wheezing or rhonchi.  Abdominal:     General: Bowel sounds are normal.     Palpations: Abdomen is soft.  Musculoskeletal:     Right lower leg: No edema.     Left lower leg: No edema.  Skin:    General: Skin is  warm and dry.  Neurological:     General: No focal deficit present.     Mental Status: He is alert.  Psychiatric:        Mood and Affect: Mood normal.        Behavior: Behavior normal.        Thought Content: Thought content normal.        Judgment: Judgment normal.   CBC    Component Value Date/Time   WBC 8.4 04/04/2021 1443   RBC 4.41 04/04/2021 1443   HGB 12.6 (L) 04/04/2021 1443   HCT 37.8 (L) 04/04/2021 1443   PLT 380.0 04/04/2021 1443   MCV 85.7 04/04/2021 1443   MCH 29.0 03/31/2021 1116   MCHC 33.3 04/04/2021 1443   RDW 14.2 04/04/2021 1443   LYMPHSABS 1.7 04/04/2021 1443   MONOABS 0.7 04/04/2021 1443   EOSABS 0.3 04/04/2021 1443   BASOSABS 0.1 04/04/2021 1443   BMP Latest Ref Rng & Units 04/04/2021 03/31/2021 12/28/2020  Glucose 70 - 99 mg/dL 121(H) 124(H) 103(H)  BUN 6 - 23 mg/dL _1 Creatinine 0.40 - 1.50 mg/dL 0.70 0.67 0.75  Sodium 135 - 145 mEq/L 139 137 135  Potassium 3.5 - 5.1 mEq/L 4.0 4.2 4.2  Chloride 96 - 112 mEq/L 104 102 101  CO2 19 - 32 mEq/L _2 Calcium 8.4 - 10.5 mg/dL 9.5 9.2 8.9   Chest imaging: CTA Chest 03/31/21 -No evidence of pulmonary embolism. -Numerous normal sized to mildly enlarged mediastinal and hilar lymph nodes consistent with history of sarcoidosis. -Post LEFT lower lobectomy.  -Scattered pulmonary nodular foci bilaterally, could be related to sarcoidosis, stability uncertain; if patient has any prior outside CT imaging, these would be of benefit in establishing stability of these findings. -Central bronchitic changes.  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   PFT: No flowsheet data found.  Labs:  04/26/2021 BAL cell count 4,720 nucleated cells with neutrophil predominance. 04/26/2021 respiratory culture-abundant haemophilus influenza beta-lactamase negative  Path:  Echo:  Heart  Catheterization:  Assessment & Plan:   Bronchiectasis without complication ([place])  Broncholithiasis  Discussion: Jackson Hatfield is a 50 year old male, former smoker with history of obesity, obstructive sleep apnea on CPAP, sarcoidosis and status post left lower lobectomy in 2003 for abnormal nodule who returns to pulmonary clinic for bronchiectasis.   Patient has significant tubular bronchiectasis with mucoid impaction of his left lung with tree-in-bud opacities.  Bronchoscopy on 04/26/2021 revealed significant narrowing of the lingular segment due to broncholith involvement.  BAL cultures were notable for haemophilus influenza in which she was treated with 14 days of doxycycline.  AFB and fungal cultures remain negative to date.  He continues to have mild intermittent night sweats and ongoing shortness of breath and chest congestion.  He has a listed penicillin allergy but this is due to hives when he was younger and his mother instructed him never to take penicillin.  We discussed trialing him on Augmentin and monitoring closely for any adverse reaction.  He expressed understanding and is willing to try this treatment in order to get better.  We will plan for prolonged Augmentin therapy at this time 875 mg twice daily.  We will add hypertonic saline nebulizer treatments to use 2-3 times a day followed by flutter valve therapy.  We will also order him a chest vest for chest PT to maximize his airway clearance regimen.  I have instructed him to resume Stiolto 2 puffs daily as a maintenance regimen for his bronchiectasis.  Follow-up in 4 to 6 weeks.  Freda Jackson, MD [facility] Office: [phone]   Current Outpatient Medications:    acetaminophen (TYLENOL) 500 MG tablet, Take 1,000 mg by mouth every 6 (six) hours as needed for moderate pain or fever., Disp: , Rfl:    albuterol (PROVENTIL) (2.5 MG/3ML) 0.083% nebulizer solution, Take 3 mLs (2.5 mg total) by  nebulization 3 (three) times daily as needed for wheezing or shortness of breath., Disp: 30 mL, Rfl: 5   albuterol (VENTOLIN HFA) 108 (90 Base) MCG/ACT inhaler, Inhale 2 puffs into the lungs every 4 (four) hours as needed for wheezing or shortness of breath., Disp: 1 each, Rfl: 2   amLODipine (NORVASC) 10 MG tablet, Take 10 mg by mouth daily., Disp: , Rfl:    amoxicillin-clavulanate (AUGMENTIN) 875-125 MG tablet, Take 1 tablet by mouth 2 (two) times daily., Disp: 60 tablet, Rfl: 0   blood glucose meter kit and supplies, Check blood sugar 2 times daily (E11.9)., Disp: 1 each, Rfl: 0   fluticasone (FLONASE) 50 MCG/ACT nasal spray, Place 1 spray into both nostrils at bedtime., Disp: , Rfl:    losartan (COZAAR) 100 MG tablet, TAKE 1 TABLET BY MOUTH EVERY DAY, Disp: 90 tablet, Rfl: 1   metFORMIN (GLUCOPHAGE) 500 MG tablet, TAKE 1 TABLET (500 MG TOTAL) BY MOUTH DAILY., Disp: 90 tablet, Rfl: 1   Multiple Vitamins-Minerals (MENS ONE DAILY PO), Take 1 tablet by mouth daily. , Disp: , Rfl:    tamsulosin (FLOMAX) 0.4 MG CAPS capsule, Take 1 capsule (0.4 mg total) by mouth daily., Disp: 90 capsule, Rfl: 0

## 2021-05-31 NOTE — Patient Instructions (Signed)
Use stiolto inhaler 2 puffs daily  Use hypertonic saline nebulizer treatment 3 times per day followed by flutter valve - ok to perform manual chest percussion to increase mucous clearance - will order you a chest vest for further chest physiotherapy for mucous clearance  Start augmentin 1 tab twice daily - if you notice hives, please let us know and stop the medication

## 2021-05-31 NOTE — Telephone Encounter (Signed)
Medication: tamsulosin (FLOMAX) 0.4 MG CAPS capsule   Has the patient contacted their pharmacy? No.  Preferred Pharmacy: [id number] - [place], [address] [place] [address]  Phone:  [phone]  Fax:  [phone]

## 2021-05-31 NOTE — Telephone Encounter (Signed)
Refill sent.

## 2021-06-01 ENCOUNTER — Encounter: Payer: Self-pay | Admitting: Pulmonary Disease

## 2021-06-06 ENCOUNTER — Telehealth: Payer: Self-pay | Admitting: Pulmonary Disease

## 2021-06-06 NOTE — Telephone Encounter (Signed)
Pt states he saw JD 12/9- was told a abx and stronger neb treatment would be called into the [facility]. It looks like Augmentin was sent, but it doesn't look like it was processed or finished. Pt also needs stronger neb med. Please advise [phone] [facility] [place] [facility] [address]

## 2021-06-07 NOTE — Telephone Encounter (Signed)
JD the abx was sent in on 12/9 but the pt stated that he was going to get a stronger neb med but this was not sent to the pharmacy.  Please advise. Thanks

## 2021-06-11 ENCOUNTER — Other Ambulatory Visit: Payer: Self-pay

## 2021-06-11 DIAGNOSIS — J479 Bronchiectasis, uncomplicated: Secondary | ICD-10-CM

## 2021-06-11 MED ORDER — SODIUM CHLORIDE 3 % IN NEBU: INHALATION_SOLUTION | Freq: Three times a day (TID) | RESPIRATORY_TRACT | 12 refills | Status: DC

## 2021-06-11 NOTE — Telephone Encounter (Signed)
Rx has been sent to the pharmacy for the patient.

## 2021-06-13 LAB — ACID FAST CULTURE WITH REFLEXED SENSITIVITIES (MYCOBACTERIA): Acid Fast Culture: NEGATIVE

## 2021-07-01 ENCOUNTER — Encounter: Payer: Self-pay | Admitting: Pulmonary Disease

## 2021-07-01 ENCOUNTER — Other Ambulatory Visit: Payer: Self-pay

## 2021-07-01 ENCOUNTER — Ambulatory Visit (INDEPENDENT_AMBULATORY_CARE_PROVIDER_SITE_OTHER): Payer: 59 | Admitting: Pulmonary Disease

## 2021-07-01 VITALS — BP 140/84 | HR 76 | Ht 74.0 in | Wt 290.0 lb

## 2021-07-01 DIAGNOSIS — J479 Bronchiectasis, uncomplicated: Secondary | ICD-10-CM

## 2021-07-01 DIAGNOSIS — J9809 Other diseases of bronchus, not elsewhere classified: Secondary | ICD-10-CM

## 2021-07-01 MED ORDER — SODIUM CHLORIDE 3 % IN NEBU: INHALATION_SOLUTION | Freq: Three times a day (TID) | RESPIRATORY_TRACT | 12 refills | Status: DC

## 2021-07-01 MED ORDER — AMOXICILLIN-POT CLAVULANATE 875-125 MG PO TABS: 1.0000 | ORAL_TABLET | Freq: Two times a day (BID) | ORAL | 0 refills | Status: AC

## 2021-07-01 NOTE — Progress Notes (Signed)
Synopsis: Referred in October 2022 for abnormal CT Chest by Collier Flowers, PA  Subjective:   PATIENT ID: Jackson Hatfield GENDER: male DOB: 05/25/1971, MRN: [id number]  HPI  Chief Complaint  Patient presents with   Follow-up    4 wk f/u. Has not started vest therapy yet.    Jackson Hatfield is a 51 year old male, former smoker with history of obesity, obstructive sleep apnea on CPAP, sarcoidosis and status post left lower lobectomy in 2003 for cyst who returns to pulmonary clinic for bronchiectasis.   Patient was started on prolonged course of augmentin at last visit. He reports he has had significant improvement in his cough and night sweats. He continues to have dyspnea. Denies fevers or chills. Denies diarrhea. He is not coughing up any sputum. He was not able to get hypertonic saline as his local pharmacy did not carry the nebulizer solution. He continues to have fatigue. He is not consistently wearing his CPAP machine.  OV 05/31/21 Patient underwent bronchoscopy with BAL on 04/26/2021 with cultures growing significant amounts of haemophilus influenzae.  Bronchoscopy findings were also significant for broncholith involvement at the bifurcation of the left upper lobe and lingular segments.  There is significant narrowing of the the airway due to the broncholith.  To date fungal and AFB cultures remain negative.  He was treated with 14 days of doxycycline with initial improvement in his night sweats.  Since completing this course of antibiotics he reports he has intermittent mild night sweats.  He continues to have cough and shortness of breath.  He stopped using Stiolto 2 puffs daily once he received albuterol nebulizer treatments which she has been doing twice daily.  He reports that the flutter valve is not providing benefit to mucus clearance.  OV 04/11/21 He recently went to the ER on 03/31/2021 for shortness of breath, fatigue, weight loss, fever and chills.  Laboratory evaluation was  unremarkable including CBC and BMP.  CT chest scan was obtained which showed mildly enlarged mediastinal and hilar lymph nodes with some containing central calcifications consistent with his history of sarcoidosis.  There is notable left lower lobectomy.  Scarring of the left upper lobe.  There is central peribronchial thickening especially of the left lung bronchiectasis of the left upper lobe with some dilated airways containing fluid/mucus.  Tree-in-bud opacities in the left upper lobe.  Patient reports a 25 pound weight loss since March.  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  He does not have any wheezing and denies any sputum production.  He reports being diagnosed with cystic fibrosis when he was younger but has not followed up with any cystic fibrosis specialist most of his life.  He is using CPAP nightly for his obstructive sleep apnea.  He underwent left lower lobectomy in 2003 when he used to live in [place] and was told that he has a cyst.  When he moved to [place] he underwent further evaluation including a mediastinoscopy and was diagnosed with sarcoidosis in 2006.  Past Medical History:  Diagnosis Date   Blood transfusion without reported diagnosis    many years ago   Diabetes mellitus without complication ([place])    Dyspnea    exerting self,ie: running   GERD (gastroesophageal reflux disease)    ? medicine related  Hyperlipidemia    Hypertension    Pneumonia    Pre-diabetes    Sarcoidosis    Sleep apnea    wears cpap     Family History  Problem Relation Age of Onset   COPD Mother    Alcohol abuse Father    Arthritis Father    Diabetes Father    Heart disease Father    Hyperlipidemia Father    Kidney disease Father    Diabetes Brother    Stroke Brother    Alcohol abuse  Brother    Diabetes Brother    Drug abuse Brother    Stomach cancer Paternal Uncle    Colon cancer Neg Hx    Colon polyps Neg Hx    Esophageal cancer Neg Hx    Rectal cancer Neg Hx      Social History   Socioeconomic History   Marital status: Divorced    Spouse name: Not on file   Number of children: Not on file   Years of education: Not on file   Highest education level: Not on file  Occupational History   Not on file  Tobacco Use   Smoking status: Never   Smokeless tobacco: Never  Vaping Use   Vaping Use: Never used  Substance and Sexual Activity   Alcohol use: Yes    Comment: ocassionally   Drug use: No   Sexual activity: Not on file  Other Topics Concern   Not on file  Social History Narrative   Not on file   Social Determinants of Health   Financial Resource Strain: Not on file  Food Insecurity: Not on file  Transportation Needs: Not on file  Physical Activity: Not on file  Stress: Not on file  Social Connections: Not on file  Intimate Partner Violence: Not on file     Allergies  Allergen Reactions   Naproxen Hives     Outpatient Medications Prior to Visit  Medication Sig Dispense Refill   acetaminophen (TYLENOL) 500 MG tablet Take 1,000 mg by mouth every 6 (six) hours as needed for moderate pain or fever.     albuterol (PROVENTIL) (2.5 MG/3ML) 0.083% nebulizer solution Take 3 mLs (2.5 mg total) by nebulization 3 (three) times daily as needed for wheezing or shortness of breath. 30 mL 5   albuterol (VENTOLIN HFA) 108 (90 Base) MCG/ACT inhaler Inhale 2 puffs into the lungs every 4 (four) hours as needed for wheezing or shortness of breath. 1 each 2   amLODipine (NORVASC) 10 MG tablet Take 10 mg by mouth daily.     blood glucose meter kit and supplies Check blood sugar 2 times daily (E11.9). 1 each 0   fluticasone (FLONASE) 50 MCG/ACT nasal spray Place 1 spray into both nostrils at bedtime.     losartan (COZAAR) 100 MG tablet TAKE 1 TABLET BY MOUTH EVERY  DAY 90 tablet 1   metFORMIN (GLUCOPHAGE) 500 MG tablet TAKE 1 TABLET (500 MG TOTAL) BY MOUTH DAILY. 90 tablet 1   Multiple Vitamins-Minerals (MENS ONE DAILY PO) Take 1 tablet by mouth daily.      tamsulosin (FLOMAX) 0.4 MG CAPS capsule Take 1 capsule (0.4 mg total) by mouth daily. 90 capsule 0   sodium chloride HYPERTONIC 3 % nebulizer solution Take by nebulization 3 (three) times daily. Use flutter valve after each nebulizer use 750 mL 12   amoxicillin-clavulanate (AUGMENTIN) 875-125 MG tablet Take 1 tablet by mouth 2 (two) times daily. 60 tablet 0   No facility-administered  medications prior to visit.    Review of Systems  Constitutional:  Negative for chills, fever, malaise/fatigue and weight loss.  HENT:  Negative for congestion, sinus pain and sore throat.   Eyes: Negative.   Respiratory:  Positive for shortness of breath. Negative for cough, hemoptysis, sputum production and wheezing.   Cardiovascular:  Negative for chest pain, palpitations, orthopnea, claudication and leg swelling.  Gastrointestinal:  Negative for abdominal pain, heartburn, nausea and vomiting.  Genitourinary: Negative.   Musculoskeletal:  Negative for joint pain and myalgias.  Skin:  Negative for rash.  Neurological:  Negative for weakness.  Endo/Heme/Allergies: Negative.   Psychiatric/Behavioral: Negative.     Objective:   Vitals:   07/01/21 0941  BP: 140/84  Pulse: 76  SpO2: 98%  Weight: 290 lb (131.5 kg)  Height: _0  (1.88 m)     Physical Exam Constitutional:      General: He is not in acute distress.    Appearance: Normal appearance. He is obese.  HENT:     Head: Normocephalic and atraumatic.  Eyes:     Conjunctiva/sclera: Conjunctivae normal.  Cardiovascular:     Rate and Rhythm: Normal rate and regular rhythm.     Pulses: Normal pulses.     Heart sounds: Normal heart sounds. No murmur heard. Pulmonary:     Effort: Pulmonary effort is normal.     Breath sounds: Rales (left base)  present. No wheezing or rhonchi.  Musculoskeletal:     Right lower leg: No edema.     Left lower leg: No edema.  Skin:    General: Skin is warm and dry.  Neurological:     General: No focal deficit present.     Mental Status: He is alert.  Psychiatric:        Mood and Affect: Mood normal.        Behavior: Behavior normal.        Thought Content: Thought content normal.        Judgment: Judgment normal.   CBC    Component Value Date/Time   WBC 8.4 04/04/2021 1443   RBC 4.41 04/04/2021 1443   HGB 12.6 (L) 04/04/2021 1443   HCT 37.8 (L) 04/04/2021 1443   PLT 380.0 04/04/2021 1443   MCV 85.7 04/04/2021 1443   MCH 29.0 03/31/2021 1116   MCHC 33.3 04/04/2021 1443   RDW 14.2 04/04/2021 1443   LYMPHSABS 1.7 04/04/2021 1443   MONOABS 0.7 04/04/2021 1443   EOSABS 0.3 04/04/2021 1443   BASOSABS 0.1 04/04/2021 1443   BMP Latest Ref Rng & Units 04/04/2021 03/31/2021 12/28/2020  Glucose 70 - 99 mg/dL 121(H) 124(H) 103(H)  BUN 6 - 23 mg/dL _1 Creatinine 0.40 - 1.50 mg/dL 0.70 0.67 0.75  Sodium 135 - 145 mEq/L 139 137 135  Potassium 3.5 - 5.1 mEq/L 4.0 4.2 4.2  Chloride 96 - 112 mEq/L 104 102 101  CO2 19 - 32 mEq/L _2 Calcium 8.4 - 10.5 mg/dL 9.5 9.2 8.9   Chest imaging: CTA Chest 03/31/21 -No evidence of pulmonary embolism. -Numerous normal sized to mildly enlarged mediastinal and hilar lymph nodes consistent with history of sarcoidosis. -Post LEFT lower lobectomy.  -Scattered pulmonary nodular foci bilaterally, could be related to sarcoidosis, stability uncertain; if patient has any prior outside CT imaging, these would be of benefit in establishing stability of these findings. -Central bronchitic changes.  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   PFT: No flowsheet data found.  Labs: 04/26/2021 BAL cell count 4,720 nucleated cells with  neutrophil predominance. 04/26/2021 respiratory culture-abundant haemophilus influenza beta-lactamase negative  Path:  Echo:  Heart Catheterization:  Assessment & Plan:   Bronchiectasis without complication ([place]) - Plan: Pulmonary Function Test, CT Chest Wo Contrast  Broncholithiasis - Plan: CT Chest Wo Contrast, Ambulatory referral to Pulmonology  Discussion: Jackson Hatfield is a 51 year old male, former smoker with history of obesity, obstructive sleep apnea on CPAP, sarcoidosis and status post left lower lobectomy in 2003 for abnormal nodule who returns to pulmonary clinic for bronchiectasis.   Patient has significant tubular bronchiectasis with mucoid impaction of his left lung with tree-in-bud opacities.  Bronchoscopy on 04/26/2021 revealed significant narrowing of the lingular segment due to broncholith involvement.  BAL cultures were notable for haemophilus influenza in which she was treated with 14 days of doxycycline.  AFB and fungal cultures are negative.  He has responded to prolonged course of augmentin. We will continue antibiotic therapy for 2 more weeks until we are able to get a repeat CT Chest scan. I am concerned he has not cleared secretions from the affected airways of his the lingula due to the obstructive nature of his airways. I will refer him to the interventional pulmonary team at [facility] for further evaluation and recommendations. We will order pulmonary function tests.  We will try to obtain records from the hospital in [place] where he had his lobectomy surgery.   We will send hypertonic saline prescription to [facility] with known stock of this product. He is awaiting a chest vest for chest PT. He is to continue albuterol nebulizer treatments with flutter valve and stiolto inhaler.  Recommended increasing his CPAP usage and we will refer him to our sleep medicine team.  Follow-up in 2 months.  Freda Jackson, MD [facility] Office:  [phone]   Current Outpatient Medications:    acetaminophen (TYLENOL) 500 MG tablet, Take 1,000 mg by mouth every 6 (six) hours as needed for moderate pain or fever., Disp: , Rfl:    albuterol (PROVENTIL) (2.5 MG/3ML) 0.083% nebulizer solution, Take 3 mLs (2.5 mg total) by nebulization 3 (three) times daily as needed for wheezing or shortness of breath., Disp: 30 mL, Rfl: 5   albuterol (VENTOLIN HFA) 108 (90 Base) MCG/ACT inhaler, Inhale 2 puffs into the lungs every 4 (four) hours as needed for wheezing or shortness of breath., Disp: 1 each, Rfl: 2   amLODipine (NORVASC) 10 MG tablet, Take 10 mg by mouth daily., Disp: , Rfl:    blood glucose meter kit and supplies, Check blood sugar 2 times daily (E11.9)., Disp: 1 each, Rfl: 0   fluticasone (FLONASE) 50 MCG/ACT nasal spray, Place 1 spray into both nostrils at bedtime., Disp: , Rfl:    losartan (COZAAR) 100 MG tablet, TAKE 1 TABLET BY MOUTH EVERY DAY, Disp: 90 tablet, Rfl: 1   metFORMIN (GLUCOPHAGE) 500 MG tablet, TAKE 1 TABLET (500 MG TOTAL) BY MOUTH DAILY., Disp: 90 tablet, Rfl: 1   Multiple Vitamins-Minerals (MENS ONE DAILY PO), Take 1 tablet by mouth daily. , Disp: , Rfl:    tamsulosin (FLOMAX) 0.4 MG CAPS capsule, Take 1 capsule (0.4 mg total) by mouth daily., Disp: 90 capsule, Rfl: 0   amoxicillin-clavulanate (AUGMENTIN) 875-125 MG tablet, Take 1 tablet by mouth 2 (two) times daily for 15 days., Disp: 30 tablet, Rfl: 0  sodium chloride HYPERTONIC 3 % nebulizer solution, Take by nebulization 3 (three) times daily. Use flutter valve after each nebulizer use, Disp: 750 mL, Rfl: 12

## 2021-07-01 NOTE — Patient Instructions (Addendum)
Use stiolto inhaler 2 puffs daily   Use hypertonic saline nebulizer treatment 3 times per day followed by flutter valve - ok to perform manual chest percussion to increase mucous clearance - start chest vest for further chest physiotherapy for mucous clearance once available   Continue augmentin 1 tab twice daily  We will check a CT Chest scan and pulmonary function tests this week.   We will refer [facility] to [facility] Interventional Pulmonary team for further evaluation of the broncholithiasis  We will request records from the hospital in [place] where you had your lung surgery

## 2021-07-02 ENCOUNTER — Telehealth: Payer: Self-pay

## 2021-07-02 NOTE — Telephone Encounter (Signed)
-----   Message from [name], MD sent at 07/01/2021  8:03 PM EST ----- Regarding: Sleep Medicine Follow up appointment Please schedule patient with a sleep medicine provider for follow up of his sleep apnea. He previously saw Dr. Elsworth Soho in 2019. Please make patient aware of the appointment date/time.  Thanks, JD

## 2021-07-03 ENCOUNTER — Other Ambulatory Visit: Payer: Self-pay | Admitting: Medical

## 2021-07-03 ENCOUNTER — Ambulatory Visit (HOSPITAL_BASED_OUTPATIENT_CLINIC_OR_DEPARTMENT_OTHER)
Admission: RE | Admit: 2021-07-03 | Discharge: 2021-07-03 | Disposition: A | Discharge status: Home / Self Care | Payer: 59 | Source: Ambulatory Visit | Attending: Pulmonary Disease | Admitting: Pulmonary Disease

## 2021-07-03 ENCOUNTER — Other Ambulatory Visit: Payer: Self-pay

## 2021-07-03 DIAGNOSIS — J479 Bronchiectasis, uncomplicated: Secondary | ICD-10-CM | POA: Insufficient documentation

## 2021-07-03 DIAGNOSIS — J9809 Other diseases of bronchus, not elsewhere classified: Secondary | ICD-10-CM | POA: Insufficient documentation

## 2021-08-02 ENCOUNTER — Ambulatory Visit: Payer: 59 | Admitting: Pulmonary Disease

## 2021-09-02 ENCOUNTER — Ambulatory Visit: Payer: 59 | Admitting: Pulmonary Disease

## 2021-10-10 ENCOUNTER — Emergency Department (HOSPITAL_COMMUNITY)
Admission: EM | Admit: 2021-10-10 | Discharge: 2021-10-10 | Disposition: A | Discharge status: Home / Self Care | Payer: Self-pay | Attending: Emergency Medicine | Admitting: Emergency Medicine

## 2021-10-10 ENCOUNTER — Other Ambulatory Visit: Payer: Self-pay

## 2021-10-10 ENCOUNTER — Encounter (HOSPITAL_COMMUNITY): Payer: Self-pay | Admitting: *Deleted

## 2021-10-10 ENCOUNTER — Emergency Department (HOSPITAL_COMMUNITY): Payer: Self-pay

## 2021-10-10 DIAGNOSIS — J189 Pneumonia, unspecified organism: Secondary | ICD-10-CM | POA: Insufficient documentation

## 2021-10-10 DIAGNOSIS — Z7984 Long term (current) use of oral hypoglycemic drugs: Secondary | ICD-10-CM | POA: Insufficient documentation

## 2021-10-10 DIAGNOSIS — J471 Bronchiectasis with (acute) exacerbation: Secondary | ICD-10-CM | POA: Insufficient documentation

## 2021-10-10 DIAGNOSIS — I10 Essential (primary) hypertension: Secondary | ICD-10-CM | POA: Insufficient documentation

## 2021-10-10 DIAGNOSIS — Z20822 Contact with and (suspected) exposure to covid-19: Secondary | ICD-10-CM | POA: Insufficient documentation

## 2021-10-10 DIAGNOSIS — E119 Type 2 diabetes mellitus without complications: Secondary | ICD-10-CM | POA: Insufficient documentation

## 2021-10-10 DIAGNOSIS — G4733 Obstructive sleep apnea (adult) (pediatric): Secondary | ICD-10-CM | POA: Insufficient documentation

## 2021-10-10 DIAGNOSIS — Z79899 Other long term (current) drug therapy: Secondary | ICD-10-CM | POA: Insufficient documentation

## 2021-10-10 LAB — TROPONIN I (HIGH SENSITIVITY)
Troponin I (High Sensitivity): 5 ng/L (ref <18)
Troponin I (High Sensitivity): 5 ng/L (ref <18)

## 2021-10-10 LAB — COMPREHENSIVE METABOLIC PANEL
ALT: 22 U/L (ref 0–44)
AST: 17 U/L (ref 15–41)
Albumin: 4 g/dL (ref 3.5–5.0)
Alkaline Phosphatase: 87 U/L (ref 38–126)
Anion gap: 6 (ref 5–15)
BUN: 17 mg/dL (ref 6–20)
CO2: 28 mmol/L (ref 22–32)
Calcium: 9.4 mg/dL (ref 8.9–10.3)
Chloride: 104 mmol/L (ref 98–111)
Creatinine, Ser: 0.78 mg/dL (ref 0.61–1.24)
GFR, Estimated: >60 mL/min (ref >60)
Glucose, Bld: 95 mg/dL (ref 70–99)
Potassium: 4 mmol/L (ref 3.5–5.1)
Sodium: 138 mmol/L (ref 135–145)
Total Bilirubin: 0.3 mg/dL (ref 0.3–1.2)
Total Protein: 7.9 g/dL (ref 6.5–8.1)

## 2021-10-10 LAB — CBC WITH DIFFERENTIAL/PLATELET
Abs Immature Granulocytes: 0.03 10*3/uL (ref 0.00–0.07)
Basophils Absolute: 0.1 10*3/uL (ref 0.0–0.1)
Basophils Relative: 1 %
Eosinophils Absolute: 0.5 10*3/uL (ref 0.0–0.5)
Eosinophils Relative: 5 %
HCT: 39.6 % (ref 39.0–52.0)
Hemoglobin: 13.5 g/dL (ref 13.0–17.0)
Immature Granulocytes: 0 %
Lymphocytes Relative: 20 %
Lymphs Abs: 2 10*3/uL (ref 0.7–4.0)
MCH: 29.2 pg (ref 26.0–34.0)
MCHC: 34.1 g/dL (ref 30.0–36.0)
MCV: 85.7 fL (ref 80.0–100.0)
Monocytes Absolute: 0.8 10*3/uL (ref 0.1–1.0)
Monocytes Relative: 8 %
Neutro Abs: 6.7 10*3/uL (ref 1.7–7.7)
Neutrophils Relative %: 66 %
Platelets: 331 10*3/uL (ref 150–400)
RBC: 4.62 MIL/uL (ref 4.22–5.81)
RDW: 13.2 % (ref 11.5–15.5)
WBC: 10 10*3/uL (ref 4.0–10.5)
nRBC: 0 % (ref 0.0–0.2)

## 2021-10-10 LAB — RESP PANEL BY RT-PCR (FLU A&B, COVID) ARPGX2
Influenza A by PCR: NEGATIVE
Influenza B by PCR: NEGATIVE
SARS Coronavirus 2 by RT PCR: NEGATIVE

## 2021-10-10 LAB — LACTIC ACID, PLASMA
Lactic Acid, Venous: 1.6 mmol/L (ref 0.5–1.9)
Lactic Acid, Venous: 1.5 mmol/L (ref 0.5–1.9)

## 2021-10-10 MED ORDER — AMOXICILLIN-POT CLAVULANATE 875-125 MG PO TABS: 1.0000 | ORAL_TABLET | Freq: Two times a day (BID) | ORAL | 0 refills | Status: AC

## 2021-10-10 MED ORDER — IPRATROPIUM-ALBUTEROL 0.5-2.5 (3) MG/3ML IN SOLN
3.0000 mL | Freq: Once | RESPIRATORY_TRACT | Status: AC
  Administered 2021-10-10: 3 mL via RESPIRATORY_TRACT
  Filled 2021-10-10: qty 3

## 2021-10-10 MED ORDER — AMOXICILLIN-POT CLAVULANATE 875-125 MG PO TABS
1.0000 | ORAL_TABLET | Freq: Once | ORAL | Status: AC
Start: 2021-10-10 — End: 2021-10-10
  Administered 2021-10-10: 1 via ORAL
  Filled 2021-10-10: qty 1

## 2021-10-10 MED ORDER — IOHEXOL 350 MG/ML SOLN
100.0000 mL | Freq: Once | INTRAVENOUS | Status: AC | PRN
  Administered 2021-10-10: 100 mL via INTRAVENOUS

## 2021-10-10 NOTE — ED Provider Notes (Signed)
?[facility] ?Provider Note ? ? ?CSN: [id number] ?Arrival date & time: 10/10/21  1219 ? ?  ? ?History ? ?Chief Complaint  ?Patient presents with  ? Shortness of Breath  ? ? ?Jackson Hatfield is a 51 y.o. male with a past medical history of DM, hypertension, sarcoidosis, followed by Dr. Erin Fulling with pulmonology, former smoker, OSA, status post left lower lobectomy, and bronchiectasis. ?He presents today for evaluation of shortness of breath and cough. ?He states that he and his fianc?e about 10 to 14 days ago developed nasal congestion and cough.  He feels like this is now settled into his chest.  He reports productive cough with purulent sputum.  He has not checked his temperature however reports he is having night sweats and feels feverish.  He states that he last used albuterol yesterday. ? ?He notes that he is lost his insurance due to his inability to work.  TOC consult is ordered.  ? ?He feels like his CPAP machine is making him cough more so he has not been wearing it. ? ?His pain is in the left side of his chest, worsens with coughing and deep breathing.  Does not radiate or move. ? ?HPI ? ?  ? ?Home Medications ?Prior to Admission medications   ?Medication Sig Start Date End Date Taking? Authorizing Provider  ?albuterol (VENTOLIN HFA) 108 (90 Base) MCG/ACT inhaler Inhale 2 puffs into the lungs every 4 (four) hours as needed for wheezing or shortness of breath. 04/03/21  Yes [name], [name], PA-C  ?amoxicillin-clavulanate (AUGMENTIN) 875-125 MG tablet Take 1 tablet by mouth every 12 (twelve) hours for 14 days. 10/10/21 10/24/21 Yes Lorin Glass, PA-C  ?fluticasone (FLONASE) 50 MCG/ACT nasal spray Place 1 spray into both nostrils at bedtime.   Yes [provider]  ?losartan (COZAAR) 100 MG tablet TAKE 1 TABLET BY MOUTH EVERY DAY 07/03/21  Yes [name], [name], PA-C  ?metFORMIN (GLUCOPHAGE) 500 MG tablet TAKE 1 TABLET (500 MG TOTAL) BY MOUTH DAILY. 07/03/21  Yes [name], [name], PA-C   ?Multiple Vitamins-Minerals (MENS ONE DAILY PO) Take 1 tablet by mouth daily.    Yes [provider]  ?sodium chloride HYPERTONIC 3 % nebulizer solution Take by nebulization 3 (three) times daily. Use flutter valve after each nebulizer use 07/01/21  Yes [name], MD  ?tamsulosin (FLOMAX) 0.4 MG CAPS capsule Take 1 capsule (0.4 mg total) by mouth daily. 05/31/21  Yes [name], [name], PA-C  ?acetaminophen (TYLENOL) 500 MG tablet Take 1,000 mg by mouth every 6 (six) hours as needed for moderate pain or fever. ?Patient not taking: Reported on 10/10/2021    [provider]  ?albuterol (PROVENTIL) (2.5 MG/3ML) 0.083% nebulizer solution Take 3 mLs (2.5 mg total) by nebulization 3 (three) times daily as needed for wheezing or shortness of breath. ?Patient not taking: Reported on 10/10/2021 04/11/21   [name], MD  ?blood glucose meter kit and supplies Check blood sugar 2 times daily (E11.9). 08/17/18   [name], [name], PA-C  ?   ? ?Allergies    ?Naproxen   ? ?Review of Systems   ?Review of Systems ? ?Physical Exam ?Updated Vital Signs ?BP 129/74   Pulse 61   Temp 97.8 ?F (36.6 ?C) (Oral)   Resp 16   Ht $R'6\' 2"'FS$  (1.88 m)   Wt 131.1 kg   SpO2 97%   BMI 37.11 kg/m?  ?Physical Exam ?Vitals and nursing note reviewed.  ?Constitutional:   ?   General: He is  not in acute distress. ?   Appearance: He is not ill-appearing.  ?HENT:  ?   Head: Atraumatic.  ?Eyes:  ?   Conjunctiva/sclera: Conjunctivae normal.  ?Cardiovascular:  ?   Rate and Rhythm: Normal rate.  ?Pulmonary:  ?   Effort: Pulmonary effort is normal. No respiratory distress.  ?   Breath sounds: Examination of the left-upper field reveals wheezing and rhonchi. Examination of the left-middle field reveals wheezing and rhonchi. Examination of the left-lower field reveals wheezing and rhonchi. Wheezing and rhonchi present.  ?Chest:  ?   Chest wall: Tenderness (TTp over left anterior chest.) present.  ?Abdominal:  ?   General: There is no  distension.  ?Musculoskeletal:  ?   Cervical back: Normal range of motion and neck supple.  ?   Right lower leg: No edema.  ?   Left lower leg: No edema.  ?   Comments: No obvious acute injury  ?Skin: ?   General: Skin is warm.  ?Neurological:  ?   Mental Status: He is alert.  ?   Comments: Awake and alert, answers all questions appropriately.  Speech is not slurred.  ?Psychiatric:     ?   Mood and Affect: Mood normal.     ?   Behavior: Behavior normal.  ? ? ?ED Results / Procedures / Treatments   ?Labs ?(all labs ordered are listed, but only abnormal results are displayed) ?Labs Reviewed  ?CULTURE, BLOOD (ROUTINE X 2)  ?CULTURE, BLOOD (ROUTINE X 2)  ?RESP PANEL BY RT-PCR (FLU A&B, COVID) [id number]  ?COMPREHENSIVE METABOLIC PANEL  ?CBC WITH DIFFERENTIAL/PLATELET  ?LACTIC ACID, PLASMA  ?LACTIC ACID, PLASMA  ?TROPONIN I (HIGH SENSITIVITY)  ?TROPONIN I (HIGH SENSITIVITY)  ? ? ?EKG ?None ? ?Radiology ?DG Chest 2 View ? ?Result Date: 10/10/2021 ?CLINICAL DATA:  Shortness of breath. EXAM: CHEST - 2 VIEW COMPARISON:  Chest x-ray April 04, 2021 FINDINGS: Left lower lobe bronchiectasis with associated opacities. No visible pneumothorax or definite pleural effusions. Cardiomediastinal silhouette is similar to prior and within normal limits. No acute osseous abnormality. IMPRESSION: Left lower lobe bronchiectasis with associated opacities. Findings are likely in part chronic; however, acute superimposed pneumonia and/or aspiration is not excluded. CT chest could further characterize if warranted. Electronically Signed   By: Margaretha Sheffield M.D.   On: 10/10/2021 13:22  ? ?CT Angio Chest PE W and/or Wo Contrast ? ?Result Date: 10/10/2021 ?CLINICAL DATA:  Left-sided pleuritic chest pain, history of sarcoidosis, abnormal chest x-ray EXAM: CT ANGIOGRAPHY CHEST WITH CONTRAST TECHNIQUE: Multidetector CT imaging of the chest was performed using the standard protocol during bolus administration of intravenous contrast. Multiplanar CT  image reconstructions and MIPs were obtained to evaluate the vascular anatomy. RADIATION DOSE REDUCTION: This exam was performed according to the departmental dose-optimization program which includes automated exposure control, adjustment of the mA and/or kV according to patient size and/or use of iterative reconstruction technique. CONTRAST:  135mL OMNIPAQUE IOHEXOL 350 MG/ML SOLN COMPARISON:  10/10/2021, 07/03/2021 FINDINGS: Cardiovascular: This is a technically adequate evaluation of the pulmonary vasculature. No filling defects or pulmonary emboli. No pericardial effusion. Heart is unremarkable. No evidence of thoracic aortic aneurysm or dissection. Mediastinum/Nodes: Calcified mediastinal lymph nodes unchanged consistent with history of sarcoidosis. Stable appearance of the thyroid, trachea, and esophagus. Lungs/Pleura: Postsurgical changes are seen from left lower lobectomy. Cystic bronchiectasis within the left lung base again noted, with bronchial wall thickening and fluid-filled bronchi more pronounced in this region on prior study. Multifocal tree in bud ground-glass  nodular airspace disease again identified, increased since prior exam. This is most pronounced within the superior segment right lower lobe and bilateral upper lobes. No effusion or pneumothorax. Upper Abdomen: Hepatic steatosis.  No acute upper abdominal finding. Musculoskeletal: No acute or destructive bony lesions. Reconstructed images demonstrate no additional findings. Review of the MIP images confirms the above findings. IMPRESSION: 1. No evidence of pulmonary embolus. 2. Progressive multifocal bilateral tree-in-bud ground-glass airspace disease, consistent with progressive infection, inflammation, or sarcoidosis. 3. Cystic bronchiectasis within the inferior aspect of the left upper lobe, with increased bronchial wall thickening and fluid-filled bronchi since prior study, consistent with progressive infection. 4. Left lower lobectomy. 5.  Calcified mediastinal and hilar lymph nodes consistent with known history of sarcoidosis. No significant change. 6. Hepatic steatosis. Electronically Signed   By: Randa Ngo M.D.   On: 10/10/2021 17:54   ?

## 2021-10-10 NOTE — ED Notes (Signed)
Ambulated pt in hallway and oxygen stayed 92% and above. Pt only complaint was feeling a dizzy. Gait normal and not assistance needed. ?

## 2021-10-10 NOTE — Discharge Instructions (Addendum)
As we discussed today it appears that your CT scan shows your lungs have worsened since your prior scan. ?Please call your pulmonologist tomorrow to set up a follow-up appointment.  If you develop fevers, worsening of your shortness of breath, or have any new or concerning symptoms please seek additional medical care and evaluation. ? ?You may have diarrhea from the antibiotics.  It is very important that you continue to take the antibiotics even if you get diarrhea unless a medical professional tells you that you may stop taking them.  If you stop too early the bacteria you are being treated for will become stronger and you may need different, more powerful antibiotics that have more side effects and worsening diarrhea.  Please stay well hydrated and consider probiotics as they may decrease the severity of your diarrhea.  ? ?

## 2021-10-10 NOTE — ED Triage Notes (Signed)
Pt c/o sob x one week with some chest pain ?

## 2021-10-16 LAB — CULTURE, BLOOD (ROUTINE X 2)
Culture: NO GROWTH
Culture: NO GROWTH
Special Requests: ADEQUATE
Special Requests: ADEQUATE

## 2021-10-27 ENCOUNTER — Telehealth: Payer: Self-pay | Admitting: Medical

## 2021-10-27 NOTE — Telephone Encounter (Signed)
Opened to review since received paperwork sent to me on bankruptcy. Will give to you on Monday morning. ?

## 2021-12-20 ENCOUNTER — Other Ambulatory Visit: Payer: Self-pay | Admitting: Medical

## 2021-12-28 ENCOUNTER — Other Ambulatory Visit: Payer: Self-pay | Admitting: Medical

## 2022-01-13 ENCOUNTER — Encounter: Payer: Self-pay | Admitting: Pulmonary Disease

## 2022-01-13 ENCOUNTER — Ambulatory Visit (INDEPENDENT_AMBULATORY_CARE_PROVIDER_SITE_OTHER): Payer: Managed Care, Other (non HMO) | Admitting: Pulmonary Disease

## 2022-01-13 VITALS — BP 142/88 | HR 77 | Ht 74.0 in | Wt 301.0 lb

## 2022-01-13 DIAGNOSIS — R0601 Orthopnea: Secondary | ICD-10-CM

## 2022-01-13 DIAGNOSIS — R0600 Dyspnea, unspecified: Secondary | ICD-10-CM | POA: Diagnosis not present

## 2022-01-13 DIAGNOSIS — J9809 Other diseases of bronchus, not elsewhere classified: Secondary | ICD-10-CM | POA: Diagnosis not present

## 2022-01-13 DIAGNOSIS — J479 Bronchiectasis, uncomplicated: Secondary | ICD-10-CM | POA: Diagnosis not present

## 2022-01-13 DIAGNOSIS — I8393 Asymptomatic varicose veins of bilateral lower extremities: Secondary | ICD-10-CM

## 2022-01-13 NOTE — Progress Notes (Signed)
Synopsis: Referred in October 2022 for abnormal CT Chest by Collier Flowers, PA  Subjective:   PATIENT ID: Jackson Hatfield GENDER: male DOB: 01-20-1971, MRN: [id number]  HPI  Chief Complaint  Patient presents with   Follow-up    F/U for bronchiectasis. Increased SOB over the past few months. Non-productive cough. Increased wheezing, especially at night.    Jackson Hatfield is a 51 year old male, former smoker with history of obesity, obstructive sleep apnea on CPAP, sarcoidosis and status post left lower lobectomy in 2003 for cyst who returns to pulmonary clinic for bronchiectasis.   He lost his insurance and was not able to be seen until now. He was not able to have the referral to [facility] pulmonary due to his insruancde issue. He reports intermittent fevers, ongoing shortness of breath, orthopnea and some swelling in his legs. He has been using his CPAP. He continues to gain weight. He was seen in the ED on 10/10/21 for shortness of breath, congestion, cough and night sweats.He was treated with augmentin for respiratory infection. CTA Chest showed cystic bronchiectasis with bronchial wall thickening and fluid filled bronchi, more so than his previous scan.  In the past inhalers and steroid trials have not helped his breathing or shortness of breath.   He reports his father had MI in his late 88s and a brother with CAD who had MI and stents placed in his early 21s.  OV 07/01/21 Patient was started on prolonged course of augmentin at last visit. He reports he has had significant improvement in his cough and night sweats. He continues to have dyspnea. Denies fevers or chills. Denies diarrhea. He is not coughing up any sputum. He was not able to get hypertonic saline as his local pharmacy did not carry the nebulizer solution. He continues to have fatigue. He is not consistently wearing his CPAP machine.  OV 05/31/21 Patient underwent bronchoscopy with BAL on 04/26/2021 with cultures  growing significant amounts of haemophilus influenzae.  Bronchoscopy findings were also significant for broncholith involvement at the bifurcation of the left upper lobe and lingular segments.  There is significant narrowing of the the airway due to the broncholith.  To date fungal and AFB cultures remain negative.  He was treated with 14 days of doxycycline with initial improvement in his night sweats.  Since completing this course of antibiotics he reports he has intermittent mild night sweats.  He continues to have cough and shortness of breath.  He stopped using Stiolto 2 puffs daily once he received albuterol nebulizer treatments which she has been doing twice daily.  He reports that the flutter valve is not providing benefit to mucus clearance.  OV 04/11/21 He recently went to the ER on 03/31/2021 for shortness of breath, fatigue, weight loss, fever and chills.  Laboratory evaluation was unremarkable including CBC and BMP.  CT chest scan was obtained which showed mildly enlarged mediastinal and hilar lymph nodes with some containing central calcifications consistent with his history of sarcoidosis.  There is notable left lower lobectomy.  Scarring of the left upper lobe.  There is central peribronchial thickening especially of the left lung bronchiectasis of the left upper lobe with some dilated airways containing fluid/mucus.  Tree-in-bud opacities in the left upper lobe.  Patient reports a 25 pound weight loss since March.  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  He does not have any wheezing and denies any sputum production.  He reports being diagnosed with cystic fibrosis when he was younger but has not followed up with any cystic fibrosis specialist most of his life.  He is using CPAP nightly for his  obstructive sleep apnea.  He underwent left lower lobectomy in 2003 when he used to live in [place] and was told that he has a cyst.  When he moved to [place] he underwent further evaluation including a mediastinoscopy and was diagnosed with sarcoidosis in 2006.  Past Medical History:  Diagnosis Date   Blood transfusion without reported diagnosis    many years ago   Diabetes mellitus without complication ([place])    Dyspnea    exerting self,ie: running   GERD (gastroesophageal reflux disease)    ? medicine related   Hyperlipidemia    Hypertension    Pneumonia    Pre-diabetes    Sarcoidosis    Sleep apnea    wears cpap     Family History  Problem Relation Age of Onset   COPD Mother    Alcohol abuse Father    Arthritis Father    Diabetes Father    Heart disease Father    Hyperlipidemia Father    Kidney disease Father    Diabetes Brother    Stroke Brother    Alcohol abuse Brother    Diabetes Brother    Drug abuse Brother    Stomach cancer Paternal Uncle    Colon cancer Neg Hx    Colon polyps Neg Hx    Esophageal cancer Neg Hx    Rectal cancer Neg Hx      Social History   Socioeconomic History   Marital status: Divorced    Spouse name: Not on file   Number of children: Not on file   Years of education: Not on file   Highest education level: Not on file  Occupational History   Not on file  Tobacco Use   Smoking status: Never   Smokeless tobacco: Never  Vaping Use   Vaping Use: Never used  Substance and Sexual Activity   Alcohol use: Yes    Comment: ocassionally   Drug use: No   Sexual activity: Not on file  Other Topics Concern   Not on file  Social History Narrative   Not on file   Social Determinants of Health   Financial Resource Strain: Not on file  Food Insecurity: Not on file  Transportation Needs: Not on file  Physical Activity: Not on file  Stress: Not on file  Social Connections: Not on file  Intimate Partner Violence: Not on file      Allergies  Allergen Reactions   Naproxen Hives     Outpatient Medications Prior to Visit  Medication Sig Dispense Refill   albuterol (PROVENTIL) (2.5 MG/3ML) 0.083% nebulizer solution Take 3 mLs (2.5 mg total) by nebulization 3 (three) times daily as needed for wheezing or shortness of breath. 30 mL 5   albuterol (VENTOLIN HFA) 108 (90 Base) MCG/ACT inhaler Inhale 2 puffs into the lungs every 4 (four) hours as needed for wheezing or shortness of breath. 1 each 2   losartan (COZAAR) 100 MG tablet TAKE 1 TABLET BY MOUTH EVERY DAY 90 tablet 1   metFORMIN (GLUCOPHAGE) 500 MG tablet TAKE 1 TABLET (500 MG TOTAL) BY MOUTH DAILY. 90 tablet 1   Multiple Vitamins-Minerals (MENS ONE DAILY  PO) Take 1 tablet by mouth daily.      sodium chloride HYPERTONIC 3 % nebulizer solution Take by nebulization 3 (three) times daily. Use flutter valve after each nebulizer use 750 mL 12   tamsulosin (FLOMAX) 0.4 MG CAPS capsule Take 1 capsule (0.4 mg total) by mouth daily. 90 capsule 0   acetaminophen (TYLENOL) 500 MG tablet Take 1,000 mg by mouth every 6 (six) hours as needed for moderate pain or fever. (Patient not taking: Reported on 10/10/2021)     blood glucose meter kit and supplies Check blood sugar 2 times daily (E11.9). 1 each 0   fluticasone (FLONASE) 50 MCG/ACT nasal spray Place 1 spray into both nostrils at bedtime.     No facility-administered medications prior to visit.    Review of Systems  Constitutional:  Positive for fever. Negative for chills, malaise/fatigue and weight loss.  HENT:  Negative for congestion, sinus pain and sore throat.   Eyes: Negative.   Respiratory:  Positive for shortness of breath. Negative for cough, hemoptysis, sputum production and wheezing.   Cardiovascular:  Positive for orthopnea and leg swelling. Negative for chest pain, palpitations and claudication.  Gastrointestinal:  Negative for abdominal pain, heartburn, nausea and vomiting.  Genitourinary: Negative.    Musculoskeletal:  Negative for joint pain and myalgias.  Skin:  Negative for rash.  Neurological:  Negative for weakness.  Endo/Heme/Allergies: Negative.   Psychiatric/Behavioral: Negative.      Objective:   Vitals:   01/13/22 1453  BP: (!) 142/88  Pulse: 77  SpO2: 98%  Weight: (!) 301 lb (136.5 kg)  Height: _0  (1.88 m)     Physical Exam Constitutional:      General: He is not in acute distress.    Appearance: Normal appearance. He is obese.  HENT:     Head: Normocephalic and atraumatic.  Eyes:     Conjunctiva/sclera: Conjunctivae normal.  Cardiovascular:     Rate and Rhythm: Normal rate and regular rhythm.     Pulses: Normal pulses.     Heart sounds: Normal heart sounds. No murmur heard. Pulmonary:     Effort: Pulmonary effort is normal.     Breath sounds: Rales (left base) present. No wheezing or rhonchi.  Musculoskeletal:     Right lower leg: No edema.     Left lower leg: No edema.  Skin:    General: Skin is warm and dry.  Neurological:     General: No focal deficit present.     Mental Status: He is alert.  Psychiatric:        Mood and Affect: Mood normal.        Behavior: Behavior normal.        Thought Content: Thought content normal.        Judgment: Judgment normal.    CBC    Component Value Date/Time   WBC 10.0 10/10/2021 1617   RBC 4.62 10/10/2021 1617   HGB 13.5 10/10/2021 1617   HCT 39.6 10/10/2021 1617   PLT 331 10/10/2021 1617   MCV 85.7 10/10/2021 1617   MCH 29.2 10/10/2021 1617   MCHC 34.1 10/10/2021 1617   RDW 13.2 10/10/2021 1617   LYMPHSABS 2.0 10/10/2021 1617   MONOABS 0.8 10/10/2021 1617   EOSABS 0.5 10/10/2021 1617   BASOSABS 0.1 10/10/2021 1617      Latest Ref Rng & Units 10/10/2021    4:17 PM 04/04/2021    2:43 PM 03/31/2021   11:16 AM  BMP  Glucose 70 -  99 mg/dL 95  121  124   BUN 6 - 20 mg/dL _0 Creatinine 0.61 - 1.24 mg/dL 0.78  0.70  0.67   Sodium 135 - 145 mmol/L 138  139  137   Potassium 3.5 - 5.1  mmol/L 4.0  4.0  4.2   Chloride 98 - 111 mmol/L 104  104  102   CO2 22 - 32 mmol/L _1 Calcium 8.9 - 10.3 mg/dL 9.4  9.5  9.2    Chest imaging: CTA Chest 03/31/21 -No evidence of pulmonary embolism. -Numerous normal sized to mildly enlarged mediastinal and hilar lymph nodes consistent with history of sarcoidosis. -Post LEFT lower lobectomy.  -Scattered pulmonary nodular foci bilaterally, could be related to sarcoidosis, stability uncertain; if patient has any prior outside CT imaging, these would be of benefit in establishing stability of these findings. -Central bronchitic changes.  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   PFT:     No data to display          Labs: 04/26/2021 BAL cell count 4,720 nucleated cells with neutrophil predominance. 04/26/2021 respiratory culture-abundant haemophilus influenza beta-lactamase negative  Path:  Echo:  Heart Catheterization:  Assessment & Plan:   Bronchiectasis without complication ([place])  Broncholithiasis - Plan: Ambulatory referral to Pulmonology  Dyspnea, unspecified type - Plan: Ambulatory referral to Cardiology  Orthopnea - Plan: Ambulatory referral to Cardiology  Varicose veins of both lower extremities, unspecified whether complicated - Plan: VAS [place] LOWER EXTREMITY VENOUS (DVT)  Discussion: Jackson Hatfield is a 51 year old male, former smoker with history of obesity, obstructive sleep apnea on CPAP, sarcoidosis and status post left lower lobectomy in 2003 for abnormal nodule who returns to pulmonary clinic for bronchiectasis.   Patient has significant tubular bronchiectasis with mucoid impaction of his left lung with tree-in-bud opacities.  Bronchoscopy on 04/26/2021 revealed significant narrowing of the lingular segment due to broncholith involvement.  BAL cultures were notable for  haemophilus influenza in which she was treated with 14 days of doxycycline.  AFB and fungal cultures are negative.He responded to prolonged course of augmentin but has since developed intermittent fevers and shortness of breath.   He does have orthopnea and lower extremity edema. We will refer him to cardiology for further evaluation and consideration of cardiac sarcoidosis.   We will refer him to [facility] pulmonary for further evaluation of the broncholithiasis and if there is possible intervention that can be done for removal of the airway stones.   We will check lower extremity dopplers to rule out DVT.   He is to continue CPAP therapy for his sleep apnea.  Follow-up in 2 months.  Freda Jackson, MD [facility] Office: [phone]   Current Outpatient Medications:    albuterol (PROVENTIL) (2.5 MG/3ML) 0.083% nebulizer solution, Take 3 mLs (2.5 mg total) by nebulization 3 (three) times daily as needed for wheezing or shortness of breath., Disp: 30 mL, Rfl: 5   albuterol (VENTOLIN HFA) 108 (90 Base) MCG/ACT inhaler, Inhale 2 puffs into the lungs every 4 (four) hours as needed for wheezing or shortness of breath., Disp: 1 each, Rfl: 2   losartan (COZAAR) 100 MG tablet, TAKE 1 TABLET BY MOUTH EVERY DAY, Disp: 90 tablet, Rfl: 1   metFORMIN (GLUCOPHAGE) 500 MG tablet, TAKE 1 TABLET (500 MG TOTAL) BY MOUTH DAILY.,  Disp: 90 tablet, Rfl: 1   Multiple Vitamins-Minerals (MENS ONE DAILY PO), Take 1 tablet by mouth daily. , Disp: , Rfl:    sodium chloride HYPERTONIC 3 % nebulizer solution, Take by nebulization 3 (three) times daily. Use flutter valve after each nebulizer use, Disp: 750 mL, Rfl: 12   tamsulosin (FLOMAX) 0.4 MG CAPS capsule, Take 1 capsule (0.4 mg total) by mouth daily., Disp: 90 capsule, Rfl: 0

## 2022-01-13 NOTE — Patient Instructions (Addendum)
We will refer you to cardiology for further evaluation of your shortness of breath  We will refer you to [facility] for further evaluation of your broncholithiasis and history of Sarcoidosis  We will print out a letter for your employer to allow you to work regionally in order to make it to your upcoming medical appointments  Follow up in 2 months

## 2022-01-15 ENCOUNTER — Ambulatory Visit (HOSPITAL_COMMUNITY)
Admission: RE | Admit: 2022-01-15 | Discharge: 2022-01-15 | Disposition: A | Discharge status: Home / Self Care | Payer: Managed Care, Other (non HMO) | Source: Ambulatory Visit | Attending: Pulmonary Disease | Admitting: Pulmonary Disease

## 2022-01-15 DIAGNOSIS — I8393 Asymptomatic varicose veins of bilateral lower extremities: Secondary | ICD-10-CM

## 2022-01-19 ENCOUNTER — Encounter: Payer: Self-pay | Admitting: Pulmonary Disease

## 2022-01-20 ENCOUNTER — Encounter: Payer: Self-pay | Admitting: Medical

## 2022-01-24 ENCOUNTER — Ambulatory Visit (HOSPITAL_BASED_OUTPATIENT_CLINIC_OR_DEPARTMENT_OTHER)
Admission: RE | Admit: 2022-01-24 | Discharge: 2022-01-24 | Disposition: A | Discharge status: Home / Self Care | Payer: Managed Care, Other (non HMO) | Source: Ambulatory Visit | Attending: Medical | Admitting: Medical

## 2022-01-24 ENCOUNTER — Ambulatory Visit: Payer: Managed Care, Other (non HMO) | Admitting: Medical

## 2022-01-24 VITALS — BP 156/78 | HR 70 | Temp 98.2°F | Resp 18 | Ht 74.0 in | Wt 302.2 lb

## 2022-01-24 DIAGNOSIS — Z23 Encounter for immunization: Secondary | ICD-10-CM | POA: Diagnosis not present

## 2022-01-24 DIAGNOSIS — Z0000 Encounter for general adult medical examination without abnormal findings: Secondary | ICD-10-CM

## 2022-01-24 DIAGNOSIS — N401 Enlarged prostate with lower urinary tract symptoms: Secondary | ICD-10-CM

## 2022-01-24 DIAGNOSIS — R0600 Dyspnea, unspecified: Secondary | ICD-10-CM

## 2022-01-24 DIAGNOSIS — D869 Sarcoidosis, unspecified: Secondary | ICD-10-CM

## 2022-01-24 DIAGNOSIS — E119 Type 2 diabetes mellitus without complications: Secondary | ICD-10-CM

## 2022-01-24 DIAGNOSIS — Z125 Encounter for screening for malignant neoplasm of prostate: Secondary | ICD-10-CM

## 2022-01-24 DIAGNOSIS — R3914 Feeling of incomplete bladder emptying: Secondary | ICD-10-CM

## 2022-01-24 MED ORDER — AMLODIPINE BESYLATE 5 MG PO TABS: 5.0000 mg | ORAL_TABLET | Freq: Every day | ORAL | 3 refills | Status: DC

## 2022-01-24 NOTE — Patient Instructions (Addendum)
For you wellness exam today I have ordered cbc, cmp and lipid panel.  Vaccine given today. Shingrix.  Recommend exercise and healthy diet.  We will let you know lab results as they come in.  Follow up date appointment will be determined after lab review.     For diabetes- check a1c.  Htn- bp borderline high. Continue losartan. Add amlodipine 5 mg dailiy.  Screening psa.  Cxr- will evaluate since appt with [facility] pul[facility]ology in October. Want you to check 02 sat daily. If down trending 02/severe then be seen in ED. If worsening overall will try to expidite pulmonologist if possible.  Preventive Care 71-13 Years Old, Male Preventive care refers to lifestyle choices and visits with your health care provider that can promote health and wellness. Preventive care visits are also called wellness exams. What can I expect for my preventive care visit? Counseling During your preventive care visit, your health care provider may ask about your: Medical history, including: Past medical problems. Family medical history. Current health, including: Emotional well-being. Home life and relationship well-being. Sexual activity. Lifestyle, including: Alcohol, nicotine or tobacco, and drug use. Access to firearms. Diet, exercise, and sleep habits. Safety issues such as seatbelt and bike helmet use. Sunscreen use. Work and work Statistician. Physical exam Your health care provider will check your: Height and weight. These may be used to calculate your BMI (body mass index). BMI is a measurement that tells if you are at a healthy weight. Waist circumference. This measures the distance around your waistline. This measurement also tells if you are at a healthy weight and may help predict your risk of certain diseases, such as type 2 diabetes and high blood pressure. Heart rate and blood pressure. Body temperature. Skin for abnormal spots. What immunizations do I need?  Vaccines are usually given at  various ages, according to a schedule. Your health care provider will recommend vaccines for you based on your age, medical history, and lifestyle or other factors, such as travel or where you work. What tests do I need? Screening Your health care provider may recommend screening tests for certain conditions. This may include: Lipid and cholesterol levels. Diabetes screening. This is done by checking your blood sugar (glucose) after you have not eaten for a while (fasting). Hepatitis B test. Hepatitis C test. HIV (human immunodeficiency virus) test. STI (sexually transmitted infection) testing, if you are at risk. Lung cancer screening. Prostate cancer screening. Colorectal cancer screening. Talk with your health care provider about your test results, treatment options, and if necessary, the need for more tests. Follow these instructions at home: Eating and drinking  Eat a diet that includes fresh fruits and vegetables, whole grains, lean protein, and low-fat dairy products. Take vitamin and mineral supplements as recommended by your health care provider. Do not drink alcohol if your health care provider tells you not to drink. If you drink alcohol: Limit how much you have to 0-2 drinks a day. Know how much alcohol is in your drink. In the [place] one drink equals one 12 oz bottle of beer (355 mL), one 5 oz glass of wine (148 mL), or one 1 oz glass of hard liquor (44 mL). Lifestyle Brush your teeth every morning and night with fluoride toothpaste. Floss one time each day. Exercise for at least 30 minutes 5 or more days each week. Do not use any products that contain nicotine or tobacco. These products include cigarettes, chewing tobacco, and vaping devices, such as e-cigarettes. If you need  help quitting, ask your health care provider. Do not use drugs. If you are sexually active, practice safe sex. Use a condom or other form of protection to prevent STIs. Take aspirin only as told by  your health care provider. Make sure that you understand how much to take and what form to take. Work with your health care provider to find out whether it is safe and beneficial for you to take aspirin daily. Find healthy ways to manage stress, such as: Meditation, yoga, or listening to music. Journaling. Talking to a trusted person. Spending time with friends and family. Minimize exposure to UV radiation to reduce your risk of skin cancer. Safety Always wear your seat belt while driving or riding in a vehicle. Do not drive: If you have been drinking alcohol. Do not ride with someone who has been drinking. When you are tired or distracted. While texting. If you have been using any mind-altering substances or drugs. Wear a helmet and other protective equipment during sports activities. If you have firearms in your house, make sure you follow all gun safety procedures. What's next? Go to your health care provider once a year for an annual wellness visit. Ask your health care provider how often you should have your eyes and teeth checked. Stay up to date on all vaccines. This information is not intended to replace advice given to you by your health care provider. Make sure you discuss any questions you have with your health care provider. Document Revised: 12/05/2020 Document Reviewed: 12/05/2020 Elsevier Patient Education  [place].

## 2022-01-24 NOTE — Progress Notes (Signed)
Subjective:    Patient ID: Jackson Hatfield, male    DOB: 1971/06/05, 51 y.o.   MRN: [id number]  HPI  Pt in for wellness exam.   Pt is still working. He drives a truck.  Eating overall healthy. Nonsmoker. Alcohol [place] and coke on weekends.    Pt willing to get shingrix vaccine.   Up to date on colonscopy   He updates me on recent [facility] pulmonologist. Below is summary. Appointment was 01-13-2022.   "We will refer you to cardiology for further evaluation of your shortness of breath   We will refer you to [facility] for further evaluation of your broncholithiasis and history of Sarcoidosis   We will print out a letter for your employer to allow you to work regionally in order to make it to your upcoming medical appointments"   Pt states getting  short when doing unloading type activities/operating crank.   He states his appointment in October and would like sooner appointment. He feels same level of sob as when he last  saw Dr. Erin Fulling. Tough state overall feeling proressively worse.  Pt is diabetic. Last a1c was 6.2 one year ago.  Htn- pt is on losartan 100 mg daily.  Bph on flomax.     Review of Systems  Constitutional:  Negative for chills, fatigue and fever.  HENT:  Negative for congestion.   Respiratory:  Positive for cough and shortness of breath. Negative for chest tightness and wheezing.   Cardiovascular:  Negative for chest pain and palpitations.  Gastrointestinal:  Negative for abdominal pain, blood in stool and nausea.  Genitourinary:  Negative for difficulty urinating.  Musculoskeletal:  Negative for back pain and joint swelling.  Skin:  Negative for rash.  Neurological:  Negative for dizziness, seizures and headaches.  Hematological:  Negative for adenopathy. Does not bruise/bleed easily.  Psychiatric/Behavioral:  Negative for confusion and dysphoric mood.     Past Medical History:  Diagnosis Date   Blood transfusion without reported diagnosis    many  years ago   Diabetes mellitus without complication ([place])    Dyspnea    exerting self,ie: running   GERD (gastroesophageal reflux disease)    ? medicine related   Hyperlipidemia    Hypertension    Pneumonia    Pre-diabetes    Sarcoidosis    Sleep apnea    wears cpap     Social History   Socioeconomic History   Marital status: Divorced    Spouse name: Not on file   Number of children: Not on file   Years of education: Not on file   Highest education level: Not on file  Occupational History   Not on file  Tobacco Use   Smoking status: Never   Smokeless tobacco: Never  Vaping Use   Vaping Use: Never used  Substance and Sexual Activity   Alcohol use: Yes    Comment: ocassionally   Drug use: No   Sexual activity: Not on file  Other Topics Concern   Not on file  Social History Narrative   Not on file   Social Determinants of Health   Financial Resource Strain: Not on file  Food Insecurity: Not on file  Transportation Needs: Not on file  Physical Activity: Not on file  Stress: Not on file  Social Connections: Not on file  Intimate Partner Violence: Not on file    Past Surgical History:  Procedure Laterality Date   BRONCHIAL WASHINGS  04/26/2021   Procedure: BRONCHIAL  WASHINGS;  Surgeon: [name], MD;  Location: [facility];  Service: Pulmonary;;   KNEE ARTHROSCOPY WITH MEDIAL MENISECTOMY Right 06/18/2018   Procedure: Right knee arthroscopy, partial medial menisectomy, debridement and drainage of cyst;  Surgeon: [name], MD;  Location: [facility];  Service: Orthopedics;  Laterality: Right;  60 mins   LOBECTOMY Left 2002   lower left  ( cyst on the lobe)in [facility]. [facility] in [place], [place] N/A 04/26/2021   Procedure: FLEX BRONCHOSCOPY WITHOUT FLUORO;  Surgeon: [name], MD;  Location: [facility];  Service: Pulmonary;  Laterality: N/A;    Family History  Problem Relation Age of Onset   COPD Mother    Alcohol abuse  Father    Arthritis Father    Diabetes Father    Heart disease Father    Hyperlipidemia Father    Kidney disease Father    Diabetes Brother    Stroke Brother    Alcohol abuse Brother    Diabetes Brother    Drug abuse Brother    Stomach cancer Paternal Uncle    Colon cancer Neg Hx    Colon polyps Neg Hx    Esophageal cancer Neg Hx    Rectal cancer Neg Hx     Allergies  Allergen Reactions   Naproxen Hives    Current Outpatient Medications on File Prior to Visit  Medication Sig Dispense Refill   albuterol (PROVENTIL) (2.5 MG/3ML) 0.083% nebulizer solution Take 3 mLs (2.5 mg total) by nebulization 3 (three) times daily as needed for wheezing or shortness of breath. 30 mL 5   albuterol (VENTOLIN HFA) 108 (90 Base) MCG/ACT inhaler Inhale 2 puffs into the lungs every 4 (four) hours as needed for wheezing or shortness of breath. 1 each 2   losartan (COZAAR) 100 MG tablet TAKE 1 TABLET BY MOUTH EVERY DAY 90 tablet 1   metFORMIN (GLUCOPHAGE) 500 MG tablet TAKE 1 TABLET (500 MG TOTAL) BY MOUTH DAILY. 90 tablet 1   Multiple Vitamins-Minerals (MENS ONE DAILY PO) Take 1 tablet by mouth daily.      sodium chloride HYPERTONIC 3 % nebulizer solution Take by nebulization 3 (three) times daily. Use flutter valve after each nebulizer use 750 mL 12   tamsulosin (FLOMAX) 0.4 MG CAPS capsule Take 1 capsule (0.4 mg total) by mouth daily. 90 capsule 0   No current facility-administered medications on file prior to visit.    BP (!) 156/78   Pulse 70   Temp 98.2 F (36.8 C)   Resp 18   Ht '6\' 2"'$  (1.88 m)   Wt (!) 302 lb 3.2 oz (137.1 kg)   SpO2 95%   BMI 38.80 kg/m        Objective:   Physical Exam General Mental Status- Alert. General Appearance- Not in acute distress.   Skin General: Color- Normal Color. Moisture- Normal Moisture.  Neck Carotid Arteries- Normal color. Moisture- Normal Moisture. No carotid bruits. No JVD.  Chest and Lung Exam Auscultation: Breath  Sounds:-Normal.  Cardiovascular Auscultation:Rythm- Regular. Murmurs & Other Heart Sounds:Auscultation of the heart reveals- No Murmurs.  Abdomen Inspection:-Inspeection Normal. Palpation/Percussion:Note:No mass. Palpation and Percussion of the abdomen reveal- Non Tender, Non Distended + BS, no rebound or guarding.   Neurologic Cranial Nerve exam:- CN III-XII intact(No nystagmus), symmetric smile. Heal to Toe Gait exam:-Normal. Finger to Nose:- Normal/Intact Strength:- 5/5 equal and symmetric strength both upper and lower extremities.        Assessment & Plan:  For you wellness exam today I have ordered cbc, cmp and lipid panel.  Vaccine given today. Shingrix.  Recommend exercise and healthy diet.  We will let you know lab results as they come in.  Follow up date appointment will be determined after lab review.     For diabetes- check a1c.  Htn- bp borderline high. Continue losartan. Add amlodipine 5 mg dailiy.  Screening psa.  Cxr- will evaluate since appt with [facility]ology in October. Want you to check 02 sat daily. If down trending 02/severe then be seen in ED. If worsening overall will try to expidite pulmonologist if possible.   [id number] charge as needed to address his chronic med problems today htn, bph and sarcoidosis. Signifiant amount of extra time spend discussing on plan going forward if respiratory symptoms worsen before scheduled [facility] appt.

## 2022-01-25 LAB — CBC WITH DIFFERENTIAL/PLATELET
Absolute Monocytes: 850 cells/uL (ref 200–950)
Basophils Absolute: 60 cells/uL (ref 0–200)
Basophils Relative: 0.7 %
Eosinophils Absolute: 349 cells/uL (ref 15–500)
Eosinophils Relative: 4.1 %
HCT: 37.2 % — ABNORMAL LOW (ref 38.5–50.0)
Hemoglobin: 13 g/dL — ABNORMAL LOW (ref 13.2–17.1)
Lymphs Abs: 1751 cells/uL (ref 850–3900)
MCH: 29.2 pg (ref 27.0–33.0)
MCHC: 34.9 g/dL (ref 32.0–36.0)
MCV: 83.6 fL (ref 80.0–100.0)
MPV: 10.2 fL (ref 7.5–12.5)
Monocytes Relative: 10 %
Neutro Abs: 5491 cells/uL (ref 1500–7800)
Neutrophils Relative %: 64.6 %
Platelets: 310 10*3/uL (ref 140–400)
RBC: 4.45 10*6/uL (ref 4.20–5.80)
RDW: 13 % (ref 11.0–15.0)
Total Lymphocyte: 20.6 %
WBC: 8.5 10*3/uL (ref 3.8–10.8)

## 2022-01-25 LAB — COMPREHENSIVE METABOLIC PANEL
AG Ratio: 1.6 (calc) (ref 1.0–2.5)
ALT: 18 U/L (ref 9–46)
AST: 14 U/L (ref 10–35)
Albumin: 4.2 g/dL (ref 3.6–5.1)
Alkaline phosphatase (APISO): 77 U/L (ref 35–144)
BUN: 14 mg/dL (ref 7–25)
CO2: 29 mmol/L (ref 20–32)
Calcium: 10 mg/dL (ref 8.6–10.3)
Chloride: 102 mmol/L (ref 98–110)
Creat: 0.74 mg/dL (ref 0.70–1.30)
Globulin: 2.7 g/dL (calc) (ref 1.9–3.7)
Glucose, Bld: 95 mg/dL (ref 65–99)
Potassium: 4.9 mmol/L (ref 3.5–5.3)
Sodium: 138 mmol/L (ref 135–146)
Total Bilirubin: 0.3 mg/dL (ref 0.2–1.2)
Total Protein: 6.9 g/dL (ref 6.1–8.1)

## 2022-01-25 LAB — LIPID PANEL
Cholesterol: 175 mg/dL (ref <200)
HDL: 47 mg/dL (ref >=40)
LDL Cholesterol (Calc): 87 mg/dL (calc)
Non-HDL Cholesterol (Calc): 128 mg/dL (calc) (ref <130)
Total CHOL/HDL Ratio: 3.7 (calc) (ref <5.0)
Triglycerides: 339 mg/dL — ABNORMAL HIGH (ref <150)

## 2022-01-25 LAB — HEMOGLOBIN A1C
Hgb A1c MFr Bld: 6.5 % of total Hgb — ABNORMAL HIGH (ref <5.7)
Mean Plasma Glucose: 140 mg/dL
eAG (mmol/L): 7.7 mmol/L

## 2022-01-25 LAB — PSA: PSA: 1.46 ng/mL (ref <=4.00)

## 2022-01-27 ENCOUNTER — Telehealth: Payer: Self-pay

## 2022-01-27 MED ORDER — ATORVASTATIN CALCIUM 10 MG PO TABS: 10.0000 mg | ORAL_TABLET | Freq: Every day | ORAL | 11 refills | Status: DC

## 2022-01-27 NOTE — Telephone Encounter (Signed)
Rx has been sent by provider and I have called pt to notify him. No answer and unable to leave message. Pt stated earlier that he will just pick it up at pharmacy once he gets notification from them.

## 2022-01-27 NOTE — Addendum Note (Signed)
Addended by: Anabel Halon on: 01/27/2022 12:26 PM   Modules accepted: Orders

## 2022-01-27 NOTE — Telephone Encounter (Signed)
Pt has called back and he is okay with taking a low dose statin and would like the rx sent to pharmacy on file. He will try it for 3 months and he has a f/u scheduled.   Please assist in sending in the medication

## 2022-01-30 MED ORDER — AMOXICILLIN-POT CLAVULANATE 875-125 MG PO TABS: 1.0000 | ORAL_TABLET | Freq: Two times a day (BID) | ORAL | 0 refills | Status: DC

## 2022-01-30 NOTE — Addendum Note (Signed)
Addended by: Anabel Halon on: 01/30/2022 06:52 AM   Modules accepted: Orders

## 2022-02-05 NOTE — Progress Notes (Deleted)
Cardiology Office Note:   Date:  02/05/2022  NAME:  Jackson Hatfield    MRN: [id number] DOB:  [birth year]   PCP:  Mackie Pai, PA-C  Cardiologist:  None  Electrophysiologist:  None   Referring MD: [name], MD   No chief complaint on file. ***  History of Present Illness:   Jackson Hatfield is a 51 y.o. male with a hx of bronchiectasis, OSA, HTN, DM who is being seen today for the evaluation of SOB at the request of [name], MD.  Problem List Bronchiectasis OSA Pulmonary sarcoidosis  DM -A1c 6.5 HTN HLD -T chol 175, HDL 47, LDL 87, TG 228  Past Medical History: Past Medical History:  Diagnosis Date   Blood transfusion without reported diagnosis    many years ago   Diabetes mellitus without complication ([facility])    Dyspnea    exerting self,ie: running   GERD (gastroesophageal reflux disease)    ? medicine related   Hyperlipidemia    Hypertension    Pneumonia    Pre-diabetes    Sarcoidosis    Sleep apnea    wears cpap    Past Surgical History: Past Surgical History:  Procedure Laterality Date   BRONCHIAL WASHINGS  04/26/2021   Procedure: BRONCHIAL WASHINGS;  Surgeon: [name], MD;  Location: [facility];  Service: Pulmonary;;   KNEE ARTHROSCOPY WITH MEDIAL MENISECTOMY Right 06/18/2018   Procedure: Right knee arthroscopy, partial medial menisectomy, debridement and drainage of cyst;  Surgeon: [name], MD;  Location: [facility];  Service: Orthopedics;  Laterality: Right;  60 mins   LOBECTOMY Left 2002   lower left  ( cyst on the lobe)in [facility]. Medical in [place], [place] N/A 04/26/2021   Procedure: FLEX BRONCHOSCOPY WITHOUT FLUORO;  Surgeon: [name], MD;  Location[facility];  Service: Pulmonary;  Laterality: N/A;    Current Medications: No outpatient medications have been marked as taking for the 02/07/22 encounter (Appointment) with O'Neal, Cassie Freer, MD.     Allergies:    Naproxen    Social History: Social History   Socioeconomic History   Marital status: Divorced    Spouse name: Not on file   Number of children: Not on file   Years of education: Not on file   Highest education level: Not on file  Occupational History   Not on file  Tobacco Use   Smoking status: Never   Smokeless tobacco: Never  Vaping Use   Vaping Use: Never used  Substance and Sexual Activity   Alcohol use: Yes    Comment: ocassionally   Drug use: No   Sexual activity: Not on file  Other Topics Concern   Not on file  Social History Narrative   Not on file   Social Determinants of Health   Financial Resource Strain: Not on file  Food Insecurity: Not on file  Transportation Needs: Not on file  Physical Activity: Not on file  Stress: Not on file  Social Connections: Not on file     Family History: The patient's ***family history includes Alcohol abuse in his brother and father; Arthritis in his father; COPD in his mother; Diabetes in his brother, brother, and father; Drug abuse in his brother; Heart disease in his father; Hyperlipidemia in his father; Kidney disease in his father; Stomach cancer in his paternal uncle; Stroke in his brother. There is no history of Colon cancer, Colon polyps, Esophageal cancer, or Rectal cancer.  ROS:   All other ROS reviewed and negative. Pertinent positives noted in the HPI.     EKGs/Labs/Other Studies Reviewed:   The following studies were personally reviewed by me today:  EKG:  EKG is *** ordered today.  The ekg ordered today demonstrates ***, and was personally reviewed by me.   Recent Labs: 03/31/2021: B Natriuretic Peptide 78.4 01/24/2022: ALT 18; BUN 14; Creat 0.74; Hemoglobin 13.0; Platelets 310; Potassium 4.9; Sodium 138   Recent Lipid Panel    Component Value Date/Time   CHOL 175 01/24/2022 1356   TRIG 339 (H) 01/24/2022 1356   HDL 47 01/24/2022 1356   CHOLHDL 3.7 01/24/2022 1356   VLDL 41.0 (H) 12/28/2020 1157   LDLCALC 87  01/24/2022 1356   LDLDIRECT 108.0 12/28/2020 1157    Physical Exam:   VS:  There were no vitals taken for this visit.   Wt Readings from Last 3 Encounters:  01/24/22 (!) 302 lb 3.2 oz (137.1 kg)  01/13/22 (!) 301 lb (136.5 kg)  10/10/21 289 lb (131.1 kg)    General: Well nourished, well developed, in no acute distress Head: Atraumatic, normal size  Eyes: PEERLA, EOMI  Neck: Supple, no JVD Endocrine: No thryomegaly Cardiac: Normal S1, S2; RRR; no murmurs, rubs, or gallops Lungs: Clear to auscultation bilaterally, no wheezing, rhonchi or rales  Abd: Soft, nontender, no hepatomegaly  Ext: No edema, pulses 2+ Musculoskeletal: No deformities, BUE and BLE strength normal and equal Skin: Warm and dry, no rashes   Neuro: Alert and oriented to person, place, time, and situation, CNII-XII grossly intact, no focal deficits  Psych: Normal mood and affect   ASSESSMENT:   Jackson Hatfield is a 51 y.o. male who presents for the following: No diagnosis found.  PLAN:   There are no diagnoses linked to this encounter.  {Are you ordering a CV Procedure (e.g. stress test, cath, DCCV, TEE, etc)?   Press F2        :[id number]}  Disposition: No follow-ups on file.  Medication Adjustments/Labs and Tests Ordered: Current medicines are reviewed at length with the patient today.  Concerns regarding medicines are outlined above.  No orders of the defined types were placed in this encounter.  No orders of the defined types were placed in this encounter.   There are no Patient Instructions on file for this visit.   Time Spent with Patient: I have spent a total of *** minutes with patient reviewing hospital notes, telemetry, EKGs, labs and examining the patient as well as establishing an assessment and plan that was discussed with the patient.  > 50% of time was spent in direct patient care.  Signed, Addison Naegeli. Audie Box, MD, [facility]  [address] [place],  [place] [address] [phone]  02/05/2022 7:43 AM

## 2022-02-07 ENCOUNTER — Ambulatory Visit: Payer: Managed Care, Other (non HMO) | Admitting: Cardiovascular Disease

## 2022-02-07 DIAGNOSIS — R0602 Shortness of breath: Secondary | ICD-10-CM

## 2022-02-17 ENCOUNTER — Telehealth: Payer: Self-pay | Admitting: Medical

## 2022-02-17 ENCOUNTER — Telehealth: Payer: Self-pay | Admitting: Pulmonary Disease

## 2022-02-17 DIAGNOSIS — Z862 Personal history of diseases of the blood and blood-forming organs and certain disorders involving the immune mechanism: Secondary | ICD-10-CM

## 2022-02-17 DIAGNOSIS — J479 Bronchiectasis, uncomplicated: Secondary | ICD-10-CM

## 2022-02-17 DIAGNOSIS — J9809 Other diseases of bronchus, not elsewhere classified: Secondary | ICD-10-CM

## 2022-02-17 DIAGNOSIS — G4733 Obstructive sleep apnea (adult) (pediatric): Secondary | ICD-10-CM

## 2022-02-17 NOTE — Telephone Encounter (Signed)
Pt.notified

## 2022-02-17 NOTE — Telephone Encounter (Signed)
Was just seen by Dr. Erin Fulling.  Can have order for new CPAP machine.  Continue same settings.  If possible please get CPAP download

## 2022-02-17 NOTE — Telephone Encounter (Signed)
Patient called to advise that his cpap machine is old and when he called [facility] they advised we would need to send a new referral to them for him to get a new machine. Please call patient to advise.

## 2022-02-18 NOTE — Telephone Encounter (Signed)
Dr. Erin Fulling, please advise on this for pt.

## 2022-02-18 NOTE — Telephone Encounter (Signed)
Referral to [facility] interventional pulmonary team can be placed and see if they can see him sooner.  Thanks, JD

## 2022-02-20 NOTE — Telephone Encounter (Signed)
Called and spoke with pt letting him know the info per JD and he verbalized understanding. Order placed for pt to be referred to [facility] interventional pulmonary. Nothing further needed.

## 2022-02-20 NOTE — Telephone Encounter (Signed)
Called and spoke with pt letting him know that we have placed an order for him to receive a new cpap machine and he verbalized understanding. Nothing further needed.

## 2022-02-21 ENCOUNTER — Ambulatory Visit: Payer: Managed Care, Other (non HMO) | Attending: Cardiovascular Disease | Admitting: Internal Medicine

## 2022-02-21 ENCOUNTER — Encounter: Payer: Self-pay | Admitting: Internal Medicine

## 2022-02-21 VITALS — BP 132/68 | HR 71 | Ht 74.0 in | Wt 301.6 lb

## 2022-02-21 DIAGNOSIS — Z01812 Encounter for preprocedural laboratory examination: Secondary | ICD-10-CM | POA: Diagnosis not present

## 2022-02-21 DIAGNOSIS — R079 Chest pain, unspecified: Secondary | ICD-10-CM | POA: Diagnosis not present

## 2022-02-21 MED ORDER — METOPROLOL TARTRATE 50 MG PO TABS: ORAL_TABLET | ORAL | 0 refills | Status: DC

## 2022-02-21 NOTE — Patient Instructions (Signed)
Medication Instructions:  Your physician recommends that you continue on your current medications as directed. Please refer to the Current Medication list given to you today.    Lab Work: TODAY: BMET If you have labs (blood work) drawn today and your tests are completely normal, you will receive your results only by: [employer] (if you have MyChart) OR A paper copy in the mail If you have any lab test that is abnormal or we need to change your treatment, we will call you to review the results.   Testing/Procedures: Your physician has requested that you have an echocardiogram. Echocardiography is a painless test that uses sound waves to create images of your heart. It provides your doctor with information about the size and shape of your heart and how well your heart's chambers and valves are working. This procedure takes approximately one hour. There are no restrictions for this procedure.    Your cardiac CT will be scheduled at one of the below locations:   [facility] [address] [place], [place] [address] [phone]   If scheduled at [facility], please arrive at the [facility] and Children's Entrance (Entrance C2) of [facility] 30 minutes prior to test start time. You can use the FREE valet parking offered at entrance C (encouraged to control the heart rate for the test)  Proceed to the [facility] Radiology Department (first floor) to check-in and test prep.  All radiology patients and guests should use entrance C2 at [facility], accessed from [facility], even though the hospital's physical address listed is [address]     Please follow these instructions carefully (unless otherwise directed):  Hold all erectile dysfunction medications at least 3 days (72 hrs) prior to test.  On the Night Before the Test: Be sure to Drink plenty of water. Do not consume any caffeinated/decaffeinated beverages or  chocolate 12 hours prior to your test. Do not take any antihistamines 12 hours prior to your test.   On the Day of the Test: Drink plenty of water until 1 hour prior to the test. Do not eat any food 4 hours prior to the test. You may take your regular medications prior to the test.  Take metoprolol (Lopressor) two hours prior to test. HOLD Furosemide/Hydrochlorothiazide morning of the test.       After the Test: Drink plenty of water. After receiving IV contrast, you may experience a mild flushed feeling. This is normal. On occasion, you may experience a mild rash up to 24 hours after the test. This is not dangerous. If this occurs, you can take Benadryl 25 mg and increase your fluid intake. If you experience trouble breathing, this can be serious. If it is severe call 911 IMMEDIATELY. If it is mild, please call our office. If you take any of these medications: Metformin, please do not take 48 hours after completing test unless otherwise instructed.  We will call to schedule your test 2-4 weeks out understanding that some insurance companies will need an authorization prior to the service being performed.   For non-scheduling related questions, please contact the cardiac imaging nurse navigator should you have any questions/concerns: Marchia Bond, Cardiac Imaging Nurse Navigator Gordy Clement, Cardiac Imaging Nurse Navigator [facility] Direct Office Dial: [phone]   For scheduling needs, including cancellations and rescheduling, please call [place], [phone].    Follow-Up: At [facility], you and your health needs are our priority.  As part of our continuing mission to provide you with exceptional heart care, we have created designated Provider Care Teams.  These Care Teams include your primary Cardiologist (physician) and Advanced Practice Providers (APPs -  Physician Assistants and Nurse Practitioners) who all work together to provide  you with the care you need, when you need it.  We recommend signing up for the patient portal called "MyChart".  Sign up information is provided on this After Visit Summary.  MyChart is used to connect with patients for Virtual Visits (Telemedicine).  Patients are able to view lab/test results, encounter notes, upcoming appointments, etc.  Non-urgent messages can be sent to your provider as well.   To learn more about what you can do with MyChart, go to [email or web address].    Your next appointment:   6 month(s)  The format for your next appointment:   In Person  Provider:   [name], MD     Other Instructions   Important Information About Sugar

## 2022-02-21 NOTE — Progress Notes (Signed)
Cardiology Office Note:    Date:  02/21/2022   ID:  Jackson Hatfield, DOB 1970-08-13, MRN [id number]  PCP:  [name], Edward, [place] Providers Cardiologist:  None     Referring MD: [name], MD   No chief complaint on file. SOB  History of Present Illness:    Jackson Hatfield is a 51 y.o. male with a hx of HTN, sarcoidosis, OSA with CPAP, T2DM referral for ?orthopnea. He feels fatigue. He notes palpitations. He was encouraged to be evaluated for cardiac sarcoid. No syncope. He gets some dizziness. He had a stress test many years ago and it was normal. He goes for walks once in awhile.  He gets short of breath with this. 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  Once in a while chest pressure with exertion. Brothers have his of CAD. Sisters have heart dx. Brother had an MI at 64. Prior smoking  Past Medical History:  Diagnosis Date   Blood transfusion without reported diagnosis    many years ago   Diabetes mellitus without complication ([place])    Dyspnea    exerting self,ie: running   GERD (gastroesophageal reflux disease)    ? medicine related   Hyperlipidemia    Hypertension    Pneumonia    Pre-diabetes    Sarcoidosis    Sleep apnea    wears cpap    Past Surgical History:  Procedure Laterality Date   BRONCHIAL WASHINGS  04/26/2021   Procedure: BRONCHIAL WASHINGS;  Surgeon: [name], MD;  Location: [facility];  Service: Pulmonary;;   KNEE ARTHROSCOPY WITH MEDIAL MENISECTOMY Right 06/18/2018   Procedure: Right knee arthroscopy, partial medial menisectomy, debridement and drainage of cyst;  Surgeon: [name], MD;  Location: [place];  Service: Orthopedics;  Laterality: Right;  60 mins   LOBECTOMY Left 2002   lower left  ( cyst on the lobe)in [facility] in [place], [place] N/A 04/26/2021   Procedure: FLEX BRONCHOSCOPY WITHOUT FLUORO;  Surgeon: [name], MD;  Location: [facility];  Service: Pulmonary;   Laterality: N/A;    Current Medications: No outpatient medications have been marked as taking for the 02/21/22 encounter (Appointment) with [name], MD.     Allergies:   Naproxen   Social History   Socioeconomic History   Marital status: Divorced    Spouse name: Not on file   Number of children: Not on file   Years of education: Not on file   Highest education level: Not on file  Occupational History   Not on file  Tobacco Use   Smoking status: Never   Smokeless tobacco: Never  Vaping Use   Vaping Use: Never used  Substance and Sexual Activity   Alcohol use: Yes    Comment: ocassionally   Drug use: No   Sexual activity: Not on file  Other Topics Concern   Not on file  Social History Narrative   Not on file   Social Determinants of Health   Financial Resource Strain: Not on file  Food Insecurity: Not on file  Transportation Needs: Not on file  Physical Activity: Not on file  Stress: Not on file  Social Connections: Not on file     Family History: The patient's family history includes Alcohol abuse in his brother and father; Arthritis in his father; COPD in his mother; Diabetes in his brother, brother, and father; Drug abuse in his brother; Heart disease in  his father; Hyperlipidemia in his father; Kidney disease in his father; Stomach cancer in his paternal uncle; Stroke in his brother. There is no history of Colon cancer, Colon polyps, Esophageal cancer, or Rectal cancer.  ROS:   Please see the history of present illness.     All other systems reviewed and are negative.  EKGs/Labs/Other Studies Reviewed:    The following studies were reviewed today:   EKG:  EKG is  ordered today.  The ekg ordered today demonstrates   02/21/2022- NSR, poor R wave progression   Recent Labs: 03/31/2021: B Natriuretic Peptide 78.4 01/24/2022: ALT 18; BUN 14; Creat 0.74; Hemoglobin 13.0; Platelets 310; Potassium 4.9; Sodium 138   Recent Lipid Panel    Component Value  Date/Time   CHOL 175 01/24/2022 1356   TRIG 339 (H) 01/24/2022 1356   HDL 47 01/24/2022 1356   CHOLHDL 3.7 01/24/2022 1356   VLDL 41.0 (H) 12/28/2020 1157   LDLCALC 87 01/24/2022 1356   LDLDIRECT 108.0 12/28/2020 1157     Risk Assessment/Calculations:          Physical Exam:    VS:  Vitals:   02/21/22 1425  BP: 132/68  Pulse: 71  SpO2: 95%     Wt Readings from Last 3 Encounters:  01/24/22 (!) 302 lb 3.2 oz (137.1 kg)  01/13/22 (!) 301 lb (136.5 kg)  10/10/21 289 lb (131.1 kg)     GEN: Obese, Well nourished, well developed in no acute distress HEENT: Normal NECK: No JVD; No carotid bruits LYMPHATICS: No lymphadenopathy CARDIAC: RRR, no murmurs, rubs, gallops RESPIRATORY:  Clear to auscultation without rales, wheezing or rhonchi  ABDOMEN: Soft, non-tender, non-distended MUSCULOSKELETAL:  No edema; No deformity  SKIN: Warm and dry NEUROLOGIC:  Alert and oriented x 3 PSYCHIATRIC:  Normal affect   ASSESSMENT:   Sarcoidosis: He has hx of pulmonary sarcoid. We discussed signs of arrhythmia that may indicate cardiac sarcoid. Will plan for an echocardiogram  Angina: CCS II; he has risk factors for CAD.Will obtain a coronary CTA  HLD: just started atorvastatin 10 mg daily.   HTN: better control. Norvasc '5mg'$  daily and losartan 100 mg daily  T2DM: on metformin. A1c 6.5% PLAN:    In order of problems listed above:  Coronary CTA morph TTE Follow up 6 months           Medication Adjustments/Labs and Tests Ordered: Current medicines are reviewed at length with the patient today.  Concerns regarding medicines are outlined above.  No orders of the defined types were placed in this encounter.  No orders of the defined types were placed in this encounter.   There are no Patient Instructions on file for this visit.   Signed, [name], MD  02/21/2022 11:27 AM    [place]

## 2022-02-22 LAB — BASIC METABOLIC PANEL
BUN/Creatinine Ratio: 16 (ref 9–20)
BUN: 15 mg/dL (ref 6–24)
CO2: 21 mmol/L (ref 20–29)
Calcium: 9.8 mg/dL (ref 8.7–10.2)
Chloride: 102 mmol/L (ref 96–106)
Creatinine, Ser: 0.92 mg/dL (ref 0.76–1.27)
Glucose: 90 mg/dL (ref 70–99)
Potassium: 4.5 mmol/L (ref 3.5–5.2)
Sodium: 142 mmol/L (ref 134–144)
eGFR: 99 mL/min/{1.73_m2} (ref >59)

## 2022-02-25 ENCOUNTER — Encounter (HOSPITAL_COMMUNITY): Payer: Self-pay

## 2022-02-26 ENCOUNTER — Other Ambulatory Visit: Payer: Self-pay | Admitting: Medical

## 2022-02-26 DIAGNOSIS — N401 Enlarged prostate with lower urinary tract symptoms: Secondary | ICD-10-CM

## 2022-02-27 ENCOUNTER — Telehealth: Payer: Self-pay | Admitting: Pulmonary Disease

## 2022-02-27 DIAGNOSIS — J479 Bronchiectasis, uncomplicated: Secondary | ICD-10-CM

## 2022-02-27 NOTE — Telephone Encounter (Signed)
Sir,  A referral that was sent to [facility] is needing a new CT scan within the last 90s days. Are you ok with [place] ordering new CT scan to sent to [facility]?  And would you like contrast or no contrast?  Please advise

## 2022-02-28 NOTE — Telephone Encounter (Signed)
Ok to order CT chest scan without contrast.   Thanks, JD

## 2022-02-28 NOTE — Telephone Encounter (Signed)
CT scan order per JD orders. CT is without contrast. Will send results over to [facility] when completed. Nothing further needed

## 2022-03-04 ENCOUNTER — Encounter (HOSPITAL_COMMUNITY): Payer: Self-pay

## 2022-03-04 ENCOUNTER — Telehealth: Payer: Self-pay | Admitting: Pulmonary Disease

## 2022-03-04 ENCOUNTER — Emergency Department (HOSPITAL_COMMUNITY)
Admission: EM | Admit: 2022-03-04 | Discharge: 2022-03-04 | Disposition: A | Discharge status: Home / Self Care | Payer: Managed Care, Other (non HMO) | Attending: Emergency Medicine | Admitting: Emergency Medicine

## 2022-03-04 ENCOUNTER — Emergency Department (HOSPITAL_COMMUNITY): Payer: Managed Care, Other (non HMO)

## 2022-03-04 ENCOUNTER — Other Ambulatory Visit: Payer: Self-pay

## 2022-03-04 DIAGNOSIS — Z20822 Contact with and (suspected) exposure to covid-19: Secondary | ICD-10-CM | POA: Diagnosis not present

## 2022-03-04 DIAGNOSIS — R0602 Shortness of breath: Secondary | ICD-10-CM | POA: Diagnosis not present

## 2022-03-04 DIAGNOSIS — R0600 Dyspnea, unspecified: Secondary | ICD-10-CM

## 2022-03-04 DIAGNOSIS — R0789 Other chest pain: Secondary | ICD-10-CM

## 2022-03-04 DIAGNOSIS — D860 Sarcoidosis of lung: Secondary | ICD-10-CM | POA: Diagnosis not present

## 2022-03-04 DIAGNOSIS — F32A Depression, unspecified: Secondary | ICD-10-CM | POA: Diagnosis not present

## 2022-03-04 DIAGNOSIS — R079 Chest pain, unspecified: Secondary | ICD-10-CM | POA: Diagnosis present

## 2022-03-04 LAB — BLOOD GAS, VENOUS
Acid-Base Excess: 5.5 mmol/L — ABNORMAL HIGH (ref 0.0–2.0)
Bicarbonate: 31.6 mmol/L — ABNORMAL HIGH (ref 20.0–28.0)
Drawn by: 6892
FIO2: 21 %
O2 Saturation: 26.3 %
Patient temperature: 36.5
pCO2, Ven: 50 mmHg (ref 44–60)
pH, Ven: 7.41 (ref 7.25–7.43)
pO2, Ven: <31 mmHg — CL (ref 32–45)

## 2022-03-04 LAB — CBC
HCT: 37 % — ABNORMAL LOW (ref 39.0–52.0)
Hemoglobin: 12.7 g/dL — ABNORMAL LOW (ref 13.0–17.0)
MCH: 29.5 pg (ref 26.0–34.0)
MCHC: 34.3 g/dL (ref 30.0–36.0)
MCV: 86 fL (ref 80.0–100.0)
Platelets: 255 10*3/uL (ref 150–400)
RBC: 4.3 MIL/uL (ref 4.22–5.81)
RDW: 12.7 % (ref 11.5–15.5)
WBC: 5.7 10*3/uL (ref 4.0–10.5)
nRBC: 0 % (ref 0.0–0.2)

## 2022-03-04 LAB — BRAIN NATRIURETIC PEPTIDE: B Natriuretic Peptide: 77 pg/mL (ref 0.0–100.0)

## 2022-03-04 LAB — BASIC METABOLIC PANEL
Anion gap: 7 (ref 5–15)
BUN: 17 mg/dL (ref 6–20)
CO2: 25 mmol/L (ref 22–32)
Calcium: 8.9 mg/dL (ref 8.9–10.3)
Chloride: 108 mmol/L (ref 98–111)
Creatinine, Ser: 0.68 mg/dL (ref 0.61–1.24)
GFR, Estimated: >60 mL/min (ref >60)
Glucose, Bld: 131 mg/dL — ABNORMAL HIGH (ref 70–99)
Potassium: 4.1 mmol/L (ref 3.5–5.1)
Sodium: 140 mmol/L (ref 135–145)

## 2022-03-04 LAB — SARS CORONAVIRUS 2 BY RT PCR: SARS Coronavirus 2 by RT PCR: NEGATIVE

## 2022-03-04 LAB — TROPONIN I (HIGH SENSITIVITY)
Troponin I (High Sensitivity): 4 ng/L (ref <18)
Troponin I (High Sensitivity): 4 ng/L (ref <18)

## 2022-03-04 MED ORDER — OXYCODONE-ACETAMINOPHEN 5-325 MG PO TABS: 1.0000 | ORAL_TABLET | Freq: Four times a day (QID) | ORAL | 0 refills | Status: DC | PRN

## 2022-03-04 MED ORDER — IOHEXOL 300 MG/ML  SOLN
75.0000 mL | Freq: Once | INTRAMUSCULAR | Status: AC | PRN
  Administered 2022-03-04: 75 mL via INTRAVENOUS

## 2022-03-04 MED ORDER — PREDNISONE 20 MG PO TABS: 20.0000 mg | ORAL_TABLET | Freq: Every day | ORAL | 0 refills | Status: AC

## 2022-03-04 NOTE — ED Notes (Signed)
ED Provider at bedside. 

## 2022-03-04 NOTE — ED Triage Notes (Signed)
Patient reports chest pain to left side of chest without radiation that started yesterday. States that he is having associated SHOB. Believes that it is related to his lungs. Due for CT scan this month to see if Sarcoidosis is affecting heart.

## 2022-03-04 NOTE — ED Notes (Signed)
Pt given ginger ale per request and w/ EDP permission.

## 2022-03-04 NOTE — Discharge Instructions (Addendum)
Thank you for coming to [facility] Emergency Department. You were seen for chest pain and shortness of breath. We did an exam, labs, and imaging, and these showed no acute findings.  Please follow up with your primary care provider within 1 week.  I spoke with the pulmonologist on-call in the hospital, Dr. Melvyn Novas, who will discuss with Dr. Erin Fulling to try to get you an appointment soon.  I am writing a prescription for a few pills of Percocet for pain control to take every 6 hours as needed as well as 20 mg prednisone p.o. daily for 30 days. Please take the prednisone until you feel better.  Please attend your coronary imaging appointment on 9/25 at [facility] at 8 AM.  Do not hesitate to return to the ED or call 911 if you experience: -Worsening symptoms -Lightheadedness, passing out -Fevers/chills -Anything else that concerns you

## 2022-03-04 NOTE — Telephone Encounter (Signed)
Patient is aware of recommendations and voiced his understanding.  Nothing further needed. 

## 2022-03-04 NOTE — Telephone Encounter (Signed)
Spoke to patient. He wanted to make Dr. Erin Fulling aware that he is currently at [facility] for left lung pain, non prod cough, wheezing, chills and sweats. Unsure if he has had a fever, as he has not measured his temp. He is requesting to be seen by Pulmonary. He is aware that ED provider will request pulmonary consult if needed.   Dr. Elsworth Soho, please advise. Dr. Erin Fulling is unavailable.

## 2022-03-04 NOTE — ED Provider Notes (Signed)
[facility] EMERGENCY DEPARTMENT Provider Note   CSN: [id number] Arrival date & time: 03/04/22  4098     History  Chief Complaint  Patient presents with   Chest Pain    Jackson Hatfield is a 51 y.o. male with sarcoidosis, OSA, morbid obesity presents with CP.  Left-sided chest pain, feels like sharp pains, occurs when short of breath and when he wakes up in the AM. Worsening chronically but acutely since Sunday. Associates this with lung pain d/t sarcoidosis. Has h/o sarcoidosis and bronchiectasis. Used to be able to walk a mile but now can't ride motorcycle without getting out of breath. Exertional dyspnea worsening. Has 9/22 CT scan scheduled to evaluate if the sarcoid is in heart. Can't sleep unless sitting straight up, +orthopnea.  Has had shortness of breath since last year. Was supposed to be scheduled w/ specialist at [facility] but the wait was too long.   Denies fevers/chills, palpitations, lightheadedness, abdominal pain, nausea/vomiting, dysuria/hematuria, constipation/diarrhea, leg swelling/pain.    Has h/o left lower lobectomy.    Chest Pain      Home Medications Prior to Admission medications   Medication Sig Start Date End Date Taking? Authorizing Provider  amLODipine (NORVASC) 5 MG tablet Take 1 tablet (5 mg total) by mouth daily. 01/24/22  Yes [name], [name], PA-C  atorvastatin (LIPITOR) 10 MG tablet Take 1 tablet (10 mg total) by mouth daily. 01/27/22  Yes [name], [name], PA-C  losartan (COZAAR) 100 MG tablet TAKE 1 TABLET BY MOUTH EVERY DAY 12/20/21  Yes [name], [name], PA-C  metFORMIN (GLUCOPHAGE) 500 MG tablet TAKE 1 TABLET (500 MG TOTAL) BY MOUTH DAILY. 12/30/21  Yes [name], [name], PA-C  Multiple Vitamins-Minerals (MENS ONE DAILY PO) Take 1 tablet by mouth daily.    Yes [provider]  oxyCODONE-acetaminophen (PERCOCET/ROXICET) 5-325 MG tablet Take 1 tablet by mouth every 6 (six) hours as needed for severe pain. 03/04/22  Yes [name], MD   predniSONE (DELTASONE) 20 MG tablet Take 1 tablet (20 mg total) by mouth daily. 03/04/22 04/03/22 Yes [name], MD  tamsulosin (FLOMAX) 0.4 MG CAPS capsule TAKE 1 CAPSULE BY MOUTH EVERY DAY 02/27/22  Yes [name], [name], PA-C  albuterol (PROVENTIL) (2.5 MG/3ML) 0.083% nebulizer solution Take 3 mLs (2.5 mg total) by nebulization 3 (three) times daily as needed for wheezing or shortness of breath. Patient not taking: Reported on 03/04/2022 04/11/21   [name], MD  albuterol (VENTOLIN HFA) 108 (90 Base) MCG/ACT inhaler Inhale 2 puffs into the lungs every 4 (four) hours as needed for wheezing or shortness of breath. Patient not taking: Reported on 03/04/2022 04/03/21   [name], [name], PA-C  metoprolol tartrate (LOPRESSOR) 50 MG tablet Take 2 hours before CT scan Patient not taking: Reported on 03/04/2022 02/21/22   [name], MD  sodium chloride HYPERTONIC 3 % nebulizer solution Take by nebulization 3 (three) times daily. Use flutter valve after each nebulizer use Patient not taking: Reported on 03/04/2022 07/01/21   [name], MD      Allergies    Naproxen    Review of Systems   Review of Systems  Cardiovascular:  Positive for chest pain.   Review of systems negative for f/c. A 10 point review of systems was performed and is negative unless otherwise reported in HPI.  Physical Exam Updated Vital Signs BP 134/74   Pulse 67   Temp 97.8 F (36.6 C) (Oral)   Resp 14   Ht '6\' 2"'$  (1.88 m)  Wt 136.1 kg   SpO2 98%   BMI 38.52 kg/m  Physical Exam General: Tired- appearing male, lying in bed.  HEENT: PERRLA, Sclera anicteric, MMM, trachea midline. Cardiology: RRR, no murmurs/rubs/gallops. BL radial and DP pulses equal bilaterally.  Resp: Slightly tachypneic with mildly increased WOB. Decreased breath sounds on the left. No wheezes, rhonchi, crackles.  Abd: Soft, non-tender, non-distended. No rebound tenderness or guarding.  GU: Deferred. MSK: No peripheral edema or  signs of trauma. Extremities without deformity or TTP. No cyanosis or clubbing. Skin: warm, dry. No rashes or lesions. Neuro: A&Ox4, CNs II-XII grossly intact. MAEs. Sensation grossly intact.  Psych: Depressed mood and affect.    ED Results / Procedures / Treatments   Labs (all labs ordered are listed, but only abnormal results are displayed) Labs Reviewed  BASIC METABOLIC PANEL - Abnormal; Notable for the following components:      Result Value   Glucose, Bld 131 (*)    All other components within normal limits  CBC - Abnormal; Notable for the following components:   Hemoglobin 12.7 (*)    HCT 37.0 (*)    All other components within normal limits  BLOOD GAS, VENOUS - Abnormal; Notable for the following components:   pO2, Ven <31 (*)    Bicarbonate 31.6 (*)    Acid-Base Excess 5.5 (*)    All other components within normal limits  SARS CORONAVIRUS 2 BY RT PCR  BRAIN NATRIURETIC PEPTIDE  TROPONIN I (HIGH SENSITIVITY)  TROPONIN I (HIGH SENSITIVITY)    EKG EKG Interpretation  Date/Time:  Tuesday March 04 2022 08:39:56 EDT Ventricular Rate:  70 PR Interval:  189 QRS Duration: 96 QT Interval:  419 QTC Calculation: 453 R Axis:   94 Text Interpretation: Sinus rhythm Anterior infarct, old Baseline wander in lead(s) V2 No significant change since last tracing Confirmed by Gareth Morgan [phone]) on 03/05/2022 2:56:06 PM  Radiology No results found.  Procedures Procedures    Medications Ordered in ED Medications  iohexol (OMNIPAQUE) 300 MG/ML solution 75 mL (75 mLs Intravenous Contrast Given 03/04/22 1403)    ED Course/ Medical Decision Making/ A&P                          Medical Decision Making Amount and/or Complexity of Data Reviewed Labs: ordered. Decision-making details documented in ED Course. Radiology: ordered. Decision-making details documented in ED Course.  Risk Prescription drug management.   For patient's dyspnea and CP, consider ACS/ischemia,  arrhythmia, PE, PTX, PNA, pulm edema/pleural effusions, anemia or valvular heart disease. Also consider worsening of patient's chronic pre-existing sarcoidosis. Patient has no fever or increased cough to suggest PNA. EKG NSR with no e/o ischemia or arrhythmia. Will obtain CXR and CT scan with contrast. Patient was scheduled for coronary angiography to determine if developing cardiac sarcoid as o/p and is currently being worked up. Patient offered pain medication and refused.   I have personally reviewed and interpreted all labs and imaging.   Clinical Course as of 03/07/22 1546  Tue Mar 04, 2022  1022 DG Chest 2 View 1. No acute cardiopulmonary disease. 2. Similar postsurgical and postinflammatory/postinfectious change in the left lung.   [HN]  1022 Troponin I (High Sensitivity): 4 [HN]  1023 CBC, BMP unremarkable in context of presentation [HN]  1440 CT Chest W Contrast 1. No acute chest findings or significant changes from previous CT identified. 2. Chronic thoracic adenopathy, pulmonary scarring and nodularity attributed to sarcoidosis and/or superimposed  chronic atypical infection such as atypical mycobacterial infection. There is chronic cystic bronchiectasis at the left lung base. 3. Hepatic steatosis.   [HN]  1440 B Natriuretic Peptide: 77.0 [HN]  1640 Patient's w/u reassuring against acute life threatening etiology of chest pain/dyspnea. His symptoms most likely are caused by his known underlying diagnosis or sarcoidosis, or progression thereof. No e/o PNA, PTX, ACS, PE.  Discussed with Dr. Christinia Gully, pulmonary medicine. Recommending pain control and prednisone 20 mg PO qd until better, maybe 30 tablets. Dr. Melvyn Novas will message Dr. Erin Fulling and have him seen faster.  Patient is instructed to f/u within 1-2 weeks with pulomonlogy. Instructed to attend his scan on 9/25 at [facility] at 0800. Given percocet rx for pain control and prednisone. Discussed extensive DC instructions and return  precautions. Patient and wife report understanding.  [HN]    Clinical Course User Index [HN] [name], MD    Dispo: DC         Final Clinical Impression(s) / ED Diagnoses Final diagnoses:  Pulmonary sarcoidosis ([place])  Other chest pain  Dyspnea, unspecified type    Rx / DC Orders ED Discharge Orders          Ordered    oxyCODONE-acetaminophen (PERCOCET/ROXICET) 5-325 MG tablet  Every 6 hours PRN        03/04/22 1651    predniSONE (DELTASONE) 20 MG tablet  Daily        03/04/22 1651             This note was created using dictation software, which may contain spelling or grammatical errors.    [name], MD 03/07/22 [phone]

## 2022-03-04 NOTE — ED Notes (Signed)
Pt verbalized understanding of discharge instructions. Opportunity for questions provided.  

## 2022-03-04 NOTE — ED Notes (Signed)
Pt reports he is waiting for a "CT scan of his heart."  Sts it is scheduled for later this month.  Pt voiced frustrations around not being able to see a Pulmonologist in a timely fashion.  His appointment is in October.

## 2022-03-07 ENCOUNTER — Other Ambulatory Visit (HOSPITAL_COMMUNITY): Payer: Managed Care, Other (non HMO)

## 2022-03-14 ENCOUNTER — Telehealth (HOSPITAL_COMMUNITY): Payer: Self-pay | Admitting: Emergency Medicine

## 2022-03-14 ENCOUNTER — Telehealth (HOSPITAL_COMMUNITY): Payer: Self-pay | Admitting: *Deleted

## 2022-03-14 ENCOUNTER — Ambulatory Visit (HOSPITAL_COMMUNITY): Payer: Managed Care, Other (non HMO) | Attending: Internal Medicine

## 2022-03-14 DIAGNOSIS — R079 Chest pain, unspecified: Secondary | ICD-10-CM | POA: Diagnosis present

## 2022-03-14 NOTE — Telephone Encounter (Signed)
Attempted to call patient regarding upcoming cardiac CT appointment. °Left message on voicemail with name and callback number °Alakai Macbride RN Navigator Cardiac Imaging °[facility] °[phone] Office °[phone] Cell ° °

## 2022-03-14 NOTE — Telephone Encounter (Signed)
Patient returning call regarding upcoming cardiac imaging study; pt verbalizes understanding of appt date/time, parking situation and where to check in, medications ordered, and verified current allergies; name and call back number provided for further questions should they arise  Gordy Clement RN Navigator Cardiac Imaging [facility] [phone] office [phone] cell  Patient to take '50mg'$  metoprolol tartrate two hours prior to his cardiac CT scan. He is aware to arrive at 7:30am.

## 2022-03-17 ENCOUNTER — Ambulatory Visit (HOSPITAL_COMMUNITY)
Admission: RE | Admit: 2022-03-17 | Discharge: 2022-03-17 | Disposition: A | Discharge status: Home / Self Care | Payer: Managed Care, Other (non HMO) | Source: Ambulatory Visit | Attending: Internal Medicine | Admitting: Internal Medicine

## 2022-03-17 DIAGNOSIS — R079 Chest pain, unspecified: Secondary | ICD-10-CM | POA: Diagnosis not present

## 2022-03-17 MED ORDER — NITROGLYCERIN 0.4 MG SL SUBL
0.8000 mg | SUBLINGUAL_TABLET | Freq: Once | SUBLINGUAL | Status: AC
  Administered 2022-03-17: 0.8 mg via SUBLINGUAL

## 2022-03-17 MED ORDER — IOHEXOL 350 MG/ML SOLN
100.0000 mL | Freq: Once | INTRAVENOUS | Status: AC | PRN
  Administered 2022-03-17: 100 mL via INTRAVENOUS

## 2022-03-17 MED ORDER — NITROGLYCERIN 0.4 MG SL SUBL
SUBLINGUAL_TABLET | SUBLINGUAL | Status: AC
  Filled 2022-03-17: qty 2

## 2022-03-18 LAB — ECHOCARDIOGRAM COMPLETE: S' Lateral: 4.1 cm

## 2022-03-19 ENCOUNTER — Other Ambulatory Visit: Payer: Self-pay

## 2022-03-19 DIAGNOSIS — R9430 Abnormal result of cardiovascular function study, unspecified: Secondary | ICD-10-CM

## 2022-03-19 DIAGNOSIS — D869 Sarcoidosis, unspecified: Secondary | ICD-10-CM

## 2022-03-20 ENCOUNTER — Telehealth: Payer: Self-pay | Admitting: *Deleted

## 2022-03-20 DIAGNOSIS — G4733 Obstructive sleep apnea (adult) (pediatric): Secondary | ICD-10-CM

## 2022-03-20 NOTE — Telephone Encounter (Signed)
Patient states he was encouraged by his cardiologist to schedule a sleep study test with the [facility]. Pt states it has been about 8 years since his last sleep study and his cpap was about 53 years old.   Please contact patient

## 2022-03-21 NOTE — Telephone Encounter (Signed)
Yes, ok to order him an inlab split night sleep study.  Thanks, JD

## 2022-03-21 NOTE — Telephone Encounter (Signed)
Called and spoke with patient. He stated that his cardiologist suggested that he have another sleep study to see how his OSA is doing. He had a sleep study about 8-9 years ago. He denied any current sleeping concerns.   He wants to know if Dr. Erin Fulling would be willing to order a sleep study for him.   Dr. Erin Fulling, can you please advise? Thanks!

## 2022-03-21 NOTE — Telephone Encounter (Signed)
Patient checking on message for sleep test. Patient phone number is [phone].

## 2022-03-24 NOTE — Telephone Encounter (Signed)
Called and spoke with patient. He verbalized understanding. He stated that he would be moving away from the area on Nov 3rd and would like to have the study done before then. Advised him I would go ahead and place the order.   Nothing further needed at time of call.

## 2022-04-04 ENCOUNTER — Ambulatory Visit (HOSPITAL_COMMUNITY): Payer: Managed Care, Other (non HMO)

## 2022-04-11 ENCOUNTER — Ambulatory Visit (HOSPITAL_COMMUNITY)
Admission: RE | Admit: 2022-04-11 | Discharge: 2022-04-11 | Disposition: A | Discharge status: Home / Self Care | Payer: Managed Care, Other (non HMO) | Source: Ambulatory Visit | Attending: Family Medicine | Admitting: Family Medicine

## 2022-04-11 ENCOUNTER — Ambulatory Visit
Admission: EM | Admit: 2022-04-11 | Discharge: 2022-04-11 | Disposition: A | Discharge status: Home / Self Care | Payer: Managed Care, Other (non HMO)

## 2022-04-11 DIAGNOSIS — J22 Unspecified acute lower respiratory infection: Secondary | ICD-10-CM

## 2022-04-11 DIAGNOSIS — Z8709 Personal history of other diseases of the respiratory system: Secondary | ICD-10-CM

## 2022-04-11 MED ORDER — DOXYCYCLINE HYCLATE 100 MG PO CAPS: 100.0000 mg | ORAL_CAPSULE | Freq: Two times a day (BID) | ORAL | 0 refills | Status: DC

## 2022-04-11 MED ORDER — PROMETHAZINE-DM 6.25-15 MG/5ML PO SYRP: 5.0000 mL | ORAL_SOLUTION | Freq: Four times a day (QID) | ORAL | 0 refills | Status: DC | PRN

## 2022-04-11 MED ORDER — PREDNISONE 20 MG PO TABS: 40.0000 mg | ORAL_TABLET | Freq: Every day | ORAL | 0 refills | Status: DC

## 2022-04-11 NOTE — Discharge Instructions (Signed)
You may go to the any pending outpatient imaging center to have your chest x-ray taken.  We will call once your results come through.  In the meantime, you may start your medications that I have sent over to start treating your respiratory issues.

## 2022-04-11 NOTE — ED Triage Notes (Signed)
Pt reports cough, nasal drip, shortness of breath, nasal congestion and chest congestion x 2 days. Taking iIbuprofen and Coricidin

## 2022-04-11 NOTE — ED Provider Notes (Signed)
[facility]    CSN: [id number] Arrival date & time: 04/11/22  1040      History   Chief Complaint Chief Complaint  Patient presents with   Shortness of Breath   Nasal Congestion         HPI Jackson Hatfield is a 51 y.o. male.   Patient presenting today with 2-day history of progressively worsening hacking productive cough, nasal congestion, shortness of breath, wheezing, rattling in chest, fatigue, body aches.  Denies fever, chest pain, abdominal pain, nausea vomiting or diarrhea.  Taking ibuprofen and Coricidin HBP with no relief.  History of bronchiectasis in the left lung followed by pulmonology.  Currently on albuterol nebulizer treatments which she states do not help.  Just got over a flare and has been on prednisone for about a month, stopped 2 days ago.  No known sick contacts recently.    Past Medical History:  Diagnosis Date   Blood transfusion without reported diagnosis    many years ago   Diabetes mellitus without complication ([name])    Dyspnea    exerting self,ie: running   GERD (gastroesophageal reflux disease)    ? medicine related   Hyperlipidemia    Hypertension    Pneumonia    Pre-diabetes    Sarcoidosis    Sleep apnea    wears cpap    Patient Active Problem List   Diagnosis Date Noted   Morbid obesity ([name]) 10/05/2017   Sarcoidosis 08/06/2017   OSA (obstructive sleep apnea) 08/06/2017    Past Surgical History:  Procedure Laterality Date   BRONCHIAL WASHINGS  04/26/2021   Procedure: BRONCHIAL WASHINGS;  Surgeon: [name], MD;  Location: [facility];  Service: Pulmonary;;   KNEE ARTHROSCOPY WITH MEDIAL MENISECTOMY Right 06/18/2018   Procedure: Right knee arthroscopy, partial medial menisectomy, debridement and drainage of cyst;  Surgeon: [name], MD;  Location: [place];  Service: Orthopedics;  Laterality: Right;  60 mins   LOBECTOMY Left 2002   lower left  ( cyst on the lobe)in [facility]. Medical in [place], [name] N/A 04/26/2021   Procedure: FLEX BRONCHOSCOPY WITHOUT FLUORO;  Surgeon: [name], MD;  Location: [facility];  Service: Pulmonary;  Laterality: N/A;       Home Medications    Prior to Admission medications   Medication Sig Start Date End Date Taking? Authorizing Provider  Chlorphen-Pseudoephed-APAP (CORICIDIN D PO) Take by mouth.   Yes [provider]  doxycycline (VIBRAMYCIN) 100 MG capsule Take 1 capsule (100 mg total) by mouth 2 (two) times daily. 04/11/22  Yes [name], PA-C  predniSONE (DELTASONE) 20 MG tablet Take 2 tablets (40 mg total) by mouth daily with breakfast. 04/11/22  Yes [name], PA-C  promethazine-dextromethorphan (PROMETHAZINE-DM) 6.25-15 MG/5ML syrup Take 5 mLs by mouth 4 (four) times daily as needed. 04/11/22  Yes [name], PA-C  albuterol (PROVENTIL) (2.5 MG/3ML) 0.083% nebulizer solution Take 3 mLs (2.5 mg total) by nebulization 3 (three) times daily as needed for wheezing or shortness of breath. Patient not taking: Reported on 03/04/2022 04/11/21   [name], MD  albuterol (VENTOLIN HFA) 108 (90 Base) MCG/ACT inhaler Inhale 2 puffs into the lungs every 4 (four) hours as needed for wheezing or shortness of breath. Patient not taking: Reported on 03/04/2022 04/03/21   [name], [name], PA-C  amLODipine (NORVASC) 5 MG tablet Take 1 tablet (5 mg total) by mouth daily. 01/24/22   [name], [name], PA-C  atorvastatin (LIPITOR) 10 MG tablet Take 1 tablet (10 mg total) by mouth daily. 01/27/22   [name], [name], PA-C  losartan (COZAAR) 100 MG tablet TAKE 1 TABLET BY MOUTH EVERY DAY 12/20/21   [name], [name], PA-C  metFORMIN (GLUCOPHAGE) 500 MG tablet TAKE 1 TABLET (500 MG TOTAL) BY MOUTH DAILY. 12/30/21   [name], [name], PA-C  metoprolol tartrate (LOPRESSOR) 50 MG tablet Take 2 hours before CT scan Patient not taking: Reported on 03/04/2022 02/21/22   [name], MD  Multiple  Vitamins-Minerals (MENS ONE DAILY PO) Take 1 tablet by mouth daily.     [provider]  oxyCODONE-acetaminophen (PERCOCET/ROXICET) 5-325 MG tablet Take 1 tablet by mouth every 6 (six) hours as needed for severe pain. Patient not taking: Reported on 03/17/2022 03/04/22   [name], MD  sodium chloride HYPERTONIC 3 % nebulizer solution Take by nebulization 3 (three) times daily. Use flutter valve after each nebulizer use Patient not taking: Reported on 03/04/2022 07/01/21   [name], MD  tamsulosin (FLOMAX) 0.4 MG CAPS capsule TAKE 1 CAPSULE BY MOUTH EVERY DAY 02/27/22   [name], [name], PA-C    Family History Family History  Problem Relation Age of Onset   COPD Mother    Alcohol abuse Father    Arthritis Father    Diabetes Father    Heart disease Father    Hyperlipidemia Father    Kidney disease Father    Diabetes Brother    Stroke Brother    Alcohol abuse Brother    Diabetes Brother    Drug abuse Brother    Stomach cancer Paternal Uncle    Colon cancer Neg Hx    Colon polyps Neg Hx    Esophageal cancer Neg Hx    Rectal cancer Neg Hx     Social History Social History   Tobacco Use   Smoking status: Never   Smokeless tobacco: Never  Vaping Use   Vaping Use: Never used  Substance Use Topics   Alcohol use: Yes    Comment: ocassionally   Drug use: No     Allergies   Naproxen   Review of Systems Review of Systems PER HPI  Physical Exam Triage Vital Signs ED Triage Vitals  Enc Vitals Group     BP 04/11/22 1048 116/62     Pulse Rate 04/11/22 1048 93     Resp 04/11/22 1048 (!) 24     Temp 04/11/22 1048 98.8 F (37.1 C)     Temp Source 04/11/22 1048 Oral     SpO2 04/11/22 1048 93 %     Weight --      Height --      Head Circumference --      Peak Flow --      Pain Score 04/11/22 1046 0     Pain Loc --      Pain Edu? --      Excl. in [place]? --    No data found.  Updated Vital Signs BP 116/62 (BP Location: Right Arm)   Pulse 93   Temp  98.8 F (37.1 C) (Oral)   Resp (!) 24   SpO2 93%   Visual Acuity Right Eye Distance:   Left Eye Distance:   Bilateral Distance:    Right Eye Near:   Left Eye Near:    Bilateral Near:     Physical Exam Vitals and nursing note reviewed.  Constitutional:      Appearance: He is well-developed.  HENT:  Head: Atraumatic.     Right Ear: External ear normal.     Left Ear: External ear normal.     Nose: Rhinorrhea present.     Mouth/Throat:     Mouth: Mucous membranes are moist.     Pharynx: Posterior oropharyngeal erythema present. No oropharyngeal exudate.  Eyes:     Conjunctiva/sclera: Conjunctivae normal.     Pupils: Pupils are equal, round, and reactive to light.  Cardiovascular:     Rate and Rhythm: Normal rate and regular rhythm.     Heart sounds: Normal heart sounds.  Pulmonary:     Effort: Pulmonary effort is normal. No respiratory distress.     Breath sounds: Wheezing present. No rales.  Musculoskeletal:        General: Normal range of motion.     Cervical back: Normal range of motion and neck supple.  Lymphadenopathy:     Cervical: No cervical adenopathy.  Skin:    General: Skin is warm and dry.  Neurological:     Mental Status: He is alert and oriented to person, place, and time.  Psychiatric:        Behavior: Behavior normal.    UC Treatments / Results  Labs (all labs ordered are listed, but only abnormal results are displayed) Labs Reviewed - No data to display  EKG   Radiology No results found.  Procedures Procedures (including critical care time)  Medications Ordered in UC Medications - No data to display  Initial Impression / Assessment and Plan / UC Course  I have reviewed the triage vital signs and the nursing notes.  Pertinent labs & imaging results that were available during my care of the patient were reviewed by me and considered in my medical decision making (see chart for details).     Mildly tachypneic in triage, otherwise  vital signs reassuring.  He is breathing comfortably on room air and speaking in full sentences.  Given his complicated history of pulmonary issues and current worsening symptoms, will cover with prednisone, doxycycline, Phenergan DM and obtain chest x-ray.  Follow-up with pulmonology and PCP as soon as possible for recheck.  Final Clinical Impressions(s) / UC Diagnoses   Final diagnoses:  Lower respiratory infection  History of bronchiectasis     Discharge Instructions      You may go to the any pending outpatient imaging center to have your chest x-ray taken.  We will call once your results come through.  In the meantime, you may start your medications that I have sent over to start treating your respiratory issues.    ED Prescriptions     Medication Sig Dispense Auth. Provider   predniSONE (DELTASONE) 20 MG tablet Take 2 tablets (40 mg total) by mouth daily with breakfast. 10 tablet [name], PA-C   doxycycline (VIBRAMYCIN) 100 MG capsule Take 1 capsule (100 mg total) by mouth 2 (two) times daily. 14 capsule [name], [place]   promethazine-dextromethorphan (PROMETHAZINE-DM) 6.25-15 MG/5ML syrup Take 5 mLs by mouth 4 (four) times daily as needed. 100 mL [name], [place]      PDMP not reviewed this encounter.   [name], [place] 04/11/22 1109

## 2022-04-18 ENCOUNTER — Other Ambulatory Visit: Payer: Self-pay | Admitting: Medical

## 2022-04-18 DIAGNOSIS — J4520 Mild intermittent asthma, uncomplicated: Secondary | ICD-10-CM

## 2022-04-21 ENCOUNTER — Telehealth (HOSPITAL_COMMUNITY): Payer: Self-pay | Admitting: Emergency Medicine

## 2022-04-21 NOTE — Telephone Encounter (Signed)
Reaching out to patient to offer assistance regarding upcoming cardiac imaging study; pt verbalizes understanding of appt date/time, parking situation and where to check in, pre-test NPO status and medications ordered, and verified current allergies; name and call back number provided for further questions should they arise Marchia Bond RN Navigator Cardiac Imaging [facility] [phone] office [phone] cell  Arrival [address] [facility]  Concerns about claustro but doesn't want meds Denies iv issues Denies metal implants

## 2022-04-22 ENCOUNTER — Ambulatory Visit (HOSPITAL_COMMUNITY)
Admission: RE | Admit: 2022-04-22 | Discharge: 2022-04-22 | Disposition: A | Discharge status: Home / Self Care | Payer: Managed Care, Other (non HMO) | Source: Ambulatory Visit | Attending: Internal Medicine | Admitting: Internal Medicine

## 2022-04-22 ENCOUNTER — Other Ambulatory Visit: Payer: Self-pay | Admitting: Internal Medicine

## 2022-04-22 DIAGNOSIS — D869 Sarcoidosis, unspecified: Secondary | ICD-10-CM

## 2022-04-22 DIAGNOSIS — R9430 Abnormal result of cardiovascular function study, unspecified: Secondary | ICD-10-CM | POA: Insufficient documentation

## 2022-04-22 MED ORDER — GADOBUTROL 1 MMOL/ML IV SOLN
15.0000 mL | Freq: Once | INTRAVENOUS | Status: AC | PRN
  Administered 2022-04-22: 15 mL via INTRAVENOUS

## 2022-04-28 ENCOUNTER — Ambulatory Visit: Payer: Managed Care, Other (non HMO) | Admitting: Medical

## 2022-04-30 ENCOUNTER — Encounter: Payer: Self-pay | Admitting: *Deleted

## 2022-05-02 ENCOUNTER — Ambulatory Visit (INDEPENDENT_AMBULATORY_CARE_PROVIDER_SITE_OTHER): Payer: Managed Care, Other (non HMO) | Admitting: Medical

## 2022-05-02 VITALS — BP 130/70 | HR 86 | Resp 18 | Ht 74.0 in | Wt 294.7 lb

## 2022-05-02 DIAGNOSIS — E119 Type 2 diabetes mellitus without complications: Secondary | ICD-10-CM | POA: Diagnosis not present

## 2022-05-02 DIAGNOSIS — J479 Bronchiectasis, uncomplicated: Secondary | ICD-10-CM | POA: Diagnosis not present

## 2022-05-02 DIAGNOSIS — D869 Sarcoidosis, unspecified: Secondary | ICD-10-CM | POA: Diagnosis not present

## 2022-05-02 DIAGNOSIS — R059 Cough, unspecified: Secondary | ICD-10-CM

## 2022-05-02 DIAGNOSIS — E785 Hyperlipidemia, unspecified: Secondary | ICD-10-CM

## 2022-05-02 DIAGNOSIS — R0600 Dyspnea, unspecified: Secondary | ICD-10-CM | POA: Diagnosis not present

## 2022-05-02 LAB — COMPREHENSIVE METABOLIC PANEL
ALT: 25 U/L (ref 0–53)
AST: 17 U/L (ref 0–37)
Albumin: 4.4 g/dL (ref 3.5–5.2)
Alkaline Phosphatase: 83 U/L (ref 39–117)
BUN: 12 mg/dL (ref 6–23)
CO2: 30 mEq/L (ref 19–32)
Calcium: 9.6 mg/dL (ref 8.4–10.5)
Chloride: 102 mEq/L (ref 96–112)
Creatinine, Ser: 0.7 mg/dL (ref 0.40–1.50)
GFR: 105.22 mL/min (ref >60.00)
Glucose, Bld: 102 mg/dL — ABNORMAL HIGH (ref 70–99)
Potassium: 4.6 mEq/L (ref 3.5–5.1)
Sodium: 138 mEq/L (ref 135–145)
Total Bilirubin: 0.4 mg/dL (ref 0.2–1.2)
Total Protein: 7.1 g/dL (ref 6.0–8.3)

## 2022-05-02 LAB — LIPID PANEL
Cholesterol: 149 mg/dL (ref 0–200)
HDL: 53.3 mg/dL (ref >39.00)
LDL Cholesterol: 65 mg/dL (ref 0–99)
NonHDL: 95.63
Total CHOL/HDL Ratio: 3
Triglycerides: 153 mg/dL — ABNORMAL HIGH (ref 0.0–149.0)
VLDL: 30.6 mg/dL (ref 0.0–40.0)

## 2022-05-02 LAB — HEMOGLOBIN A1C: Hgb A1c MFr Bld: 7 % — ABNORMAL HIGH (ref 4.6–6.5)

## 2022-05-02 MED ORDER — PROMETHAZINE-DM 6.25-15 MG/5ML PO SYRP: 5.0000 mL | ORAL_SOLUTION | Freq: Four times a day (QID) | ORAL | 0 refills | Status: DC | PRN

## 2022-05-02 NOTE — Patient Instructions (Addendum)
Diabetes with high cholesterol. Borderline A1c/controlled at 6.5 last time. Will recheck today. Continue low sugar diet. Continue statin and check lipid panel.  Ozempic option for diabetes and weight loss as we discussed.  Bronchiectasis with sarcoidosis history. Glad to hear you have appointment with pulmonologist. Also being worked up for possible sarcoid of heart.  Refilling phenergan cough syrup.   Follow up 3 month or sooner if needed.

## 2022-05-02 NOTE — Progress Notes (Addendum)
Subjective:    Patient ID: Jackson Hatfield, male    DOB: 1971/04/18, 51 y.o.   MRN: [id number]  HPI  Pt in for follow up.  Pt has appointment with [facility] on 05-20-2022. He will see bronchiectasis specialist. States coughing a lot at night and not sleeping.  He is using pherngan DM for cough.  Pt seen by ED 04-11-2022.  Pt already saw [facility] pulmonologist on 03-26-2022. He will get pft on 05-20-2022.   Also work up being done for sarcoidosis of heart. Now petscan is pending.   Pt is fasting. He has high triglycerides. Pt is on atorvastatin.   On review diabetic with A1c of 6.5.    Review of Systems  Constitutional:  Negative for chills and fatigue.  HENT:  Negative for congestion and dental problem.   Respiratory:  Positive for shortness of breath. Negative for chest tightness and wheezing.        Daily as before.  Cardiovascular:  Negative for chest pain and palpitations.  Gastrointestinal:  Negative for abdominal pain.  Musculoskeletal:  Negative for back pain.  Neurological:  Negative for dizziness, numbness and headaches.  Hematological:  Negative for adenopathy. Does not bruise/bleed easily.  Psychiatric/Behavioral:  Negative for behavioral problems and decreased concentration.      Past Medical History:  Diagnosis Date   Blood transfusion without reported diagnosis    many years ago   Diabetes mellitus without complication ([place])    Dyspnea    exerting self,ie: running   GERD (gastroesophageal reflux disease)    ? medicine related   Hyperlipidemia    Hypertension    Pneumonia    Pre-diabetes    Sarcoidosis    Sleep apnea    wears cpap     Social History   Socioeconomic History   Marital status: Divorced    Spouse name: Not on file   Number of children: Not on file   Years of education: Not on file   Highest education level: Not on file  Occupational History   Not on file  Tobacco Use   Smoking status: Never   Smokeless tobacco: Never  Vaping Use    Vaping Use: Never used  Substance and Sexual Activity   Alcohol use: Yes    Comment: ocassionally   Drug use: No   Sexual activity: Not on file  Other Topics Concern   Not on file  Social History Narrative   Not on file   Social Determinants of Health   Financial Resource Strain: Not on file  Food Insecurity: Not on file  Transportation Needs: Not on file  Physical Activity: Not on file  Stress: Not on file  Social Connections: Not on file  Intimate Partner Violence: Not on file    Past Surgical History:  Procedure Laterality Date   BRONCHIAL WASHINGS  04/26/2021   Procedure: BRONCHIAL WASHINGS;  Surgeon: [name], MD;  Location: [facility];  Service: Pulmonary;;   KNEE ARTHROSCOPY WITH MEDIAL MENISECTOMY Right 06/18/2018   Procedure: Right knee arthroscopy, partial medial menisectomy, debridement and drainage of cyst;  Surgeon: [name], MD;  Location: [place];  Service: Orthopedics;  Laterality: Right;  60 mins   LOBECTOMY Left 2002   lower left  ( cyst on the lobe)in [facility]. Medical in [place], [place] N/A 04/26/2021   Procedure: FLEX BRONCHOSCOPY WITHOUT FLUORO;  Surgeon: [name], MD;  Location: [facility];  Service: Pulmonary;  Laterality: N/A;  Family History  Problem Relation Age of Onset   COPD Mother    Alcohol abuse Father    Arthritis Father    Diabetes Father    Heart disease Father    Hyperlipidemia Father    Kidney disease Father    Diabetes Brother    Stroke Brother    Alcohol abuse Brother    Diabetes Brother    Drug abuse Brother    Stomach cancer Paternal Uncle    Colon cancer Neg Hx    Colon polyps Neg Hx    Esophageal cancer Neg Hx    Rectal cancer Neg Hx     Allergies  Allergen Reactions   Naproxen Hives    Current Outpatient Medications on File Prior to Visit  Medication Sig Dispense Refill   albuterol (PROVENTIL) (2.5 MG/3ML) 0.083% nebulizer solution Take 3 mLs (2.5 mg total)  by nebulization 3 (three) times daily as needed for wheezing or shortness of breath. (Patient not taking: Reported on 03/04/2022) 30 mL 5   albuterol (VENTOLIN HFA) 108 (90 Base) MCG/ACT inhaler INHALE 2 PUFFS INTO THE LUNGS EVERY 4 HOURS AS NEEDED FOR WHEEZING OR SHORTNESS OF BREATH. 6.7 each 2   amLODipine (NORVASC) 5 MG tablet Take 1 tablet (5 mg total) by mouth daily. 30 tablet 3   atorvastatin (LIPITOR) 10 MG tablet Take 1 tablet (10 mg total) by mouth daily. 30 tablet 11   Chlorphen-Pseudoephed-APAP (CORICIDIN D PO) Take by mouth.     doxycycline (VIBRAMYCIN) 100 MG capsule Take 1 capsule (100 mg total) by mouth 2 (two) times daily. 14 capsule 0   losartan (COZAAR) 100 MG tablet TAKE 1 TABLET BY MOUTH EVERY DAY 90 tablet 1   metFORMIN (GLUCOPHAGE) 500 MG tablet TAKE 1 TABLET (500 MG TOTAL) BY MOUTH DAILY. 90 tablet 1   metoprolol tartrate (LOPRESSOR) 50 MG tablet Take 2 hours before CT scan (Patient not taking: Reported on 03/04/2022) 1 tablet 0   Multiple Vitamins-Minerals (MENS ONE DAILY PO) Take 1 tablet by mouth daily.      oxyCODONE-acetaminophen (PERCOCET/ROXICET) 5-325 MG tablet Take 1 tablet by mouth every 6 (six) hours as needed for severe pain. (Patient not taking: Reported on 03/17/2022) 15 tablet 0   predniSONE (DELTASONE) 20 MG tablet Take 2 tablets (40 mg total) by mouth daily with breakfast. 10 tablet 0   sodium chloride HYPERTONIC 3 % nebulizer solution Take by nebulization 3 (three) times daily. Use flutter valve after each nebulizer use (Patient not taking: Reported on 03/04/2022) 750 mL 12   tamsulosin (FLOMAX) 0.4 MG CAPS capsule TAKE 1 CAPSULE BY MOUTH EVERY DAY 90 capsule 0   No current facility-administered medications on file prior to visit.    BP 130/70   Pulse 86   Resp 18   Ht '6\' 2"'$  (1.88 m)   Wt 294 lb 11.2 oz (133.7 kg)   SpO2 98%   BMI 37.84 kg/m        Objective:   Physical Exam  General Mental Status- Alert. General Appearance- Not in acute distress.    Skin General: Color- Normal Color. Moisture- Normal Moisture.  Neck Carotid Arteries- Normal color. Moisture- Normal Moisture. No carotid bruits. No JVD.  Chest and Lung Exam Auscultation: Breath Sounds:-Normal.  Cardiovascular Auscultation:Rythm- Regular. Murmurs & Other Heart Sounds:Auscultation of the heart reveals- No Murmurs.  Abdomen Inspection:-Inspeection Normal. Palpation/Percussion:Note:No mass. Palpation and Percussion of the abdomen reveal- Non Tender, Non Distended + BS, no rebound or guarding.   Neurologic Cranial Nerve  exam:- CN III-XII intact(No nystagmus), symmetric smile. Strength:- 5/5 equal and symmetric strength both upper and lower extremities.       Assessment & Plan:   Patient Instructions  Diabetes with high cholesterol. Borderline A1c/controlled at 6.5 last time. Will recheck today. Continue low sugar diet. Continue statin and check lipid panel.  Ozempic option for diabetes and weight loss as we discussed.  Bronchiectasis with sarcoidosis history. Glad to hear you have appointment with pulmonologist. Also being worked up for possible sarcoid of heart.  Refilling phenergan cough syrup.   Follow up 3 month or sooner if needed.     Mackie Pai, PA-C    Time spent with patient today was 40  minutes which consisted of chart review, discussed struggles with lung condition, upcoming work up, treatment and documentation. Spend time answering  pt questions and listening to his frustrations regarding pulmonary symptoms/ percieved no improvement in his condition.

## 2022-05-13 ENCOUNTER — Telehealth: Payer: Self-pay

## 2022-05-13 NOTE — Telephone Encounter (Signed)
-----   Message from [name], MD sent at 05/12/2022  4:45 PM EST ----- Regarding: FW: In Lab Study Denied Please fax a letter of necessity to [facility] stating patient needs to have in lab cpap titration study completed based on his weight gain of 30lbs. He does not need new CPAP equipment.   Thanks, JD   ----- Message ----- From: Dawayne Cirri Sent: 05/09/2022   8:16 AM EST To: [name], MD Subject: In Lab Study Denied                            Good Morning, In lab Study denied as not being medically necessary and is only medically necessary for reassesses of OSA if there has been a 10% decrease in body mass or if there is an intolerance to PAP. In Lab is only necessary if Prior sleep test was inadequate or failed to find thew source of the sleep problem. A new sleep study cannot be ordered to supply new positive airway pressure equipment. If you disagree with the decision you have the option to fax a letter of Medical Necessity to [facility] at [phone]. Thanks

## 2022-05-13 NOTE — Telephone Encounter (Signed)
Letter has been signed by Dr. Erin Fulling and faxed to [facility].

## 2022-05-25 ENCOUNTER — Other Ambulatory Visit: Payer: Self-pay | Admitting: Medical

## 2022-05-28 ENCOUNTER — Telehealth: Payer: Self-pay | Admitting: Internal Medicine

## 2022-05-28 NOTE — Telephone Encounter (Signed)
New Message:     Patient said he thought that Dr Harl Bowie had wanted him to have a Pet Scan. He said he would like to get that asap please. He says he is still having problems with his heart..   Patient c/o Palpitations:  High priority if patient c/o lightheadedness, shortness of breath, or chest pain  How long have you had palpitations/irregular HR/ Afib? Are you having the symptoms now? Heart racing a lot at night  Are you currently experiencing lightheadedness, SOB or CP? Shortness of breath a lot-but not at this exact  Do you have a history of afib (atrial fibrillation) or irregular heart rhythm?   Have you checked your BP or HR? (document readings if available):  no way of taking it  Are you experiencing any other symptoms? Real tired and unable to sleep

## 2022-05-28 NOTE — Telephone Encounter (Signed)
Spoke to patient ,  he staes he was reviewing  mychart and it was mention possible PET  Scan being ordered. Patient states he has not heard anything about scheduling .  Rn informed patient will send to Dr Chyrl Civatte to review  - and see if see would like to schedule test.  Aware Dr Harl Bowie is not in the office this week  Aware will contact once response is given.

## 2022-06-02 ENCOUNTER — Encounter: Payer: Self-pay | Admitting: Internal Medicine

## 2022-06-03 NOTE — Telephone Encounter (Signed)
Please see MyChart encounter.

## 2022-06-12 ENCOUNTER — Other Ambulatory Visit: Payer: Self-pay | Admitting: Medical

## 2022-06-12 ENCOUNTER — Other Ambulatory Visit: Payer: Self-pay | Admitting: Pulmonary Disease

## 2022-06-12 DIAGNOSIS — N401 Enlarged prostate with lower urinary tract symptoms: Secondary | ICD-10-CM

## 2022-06-13 ENCOUNTER — Telehealth: Payer: Self-pay | Admitting: Medical

## 2022-06-13 NOTE — Telephone Encounter (Signed)
Pt called stating that he would like to double up on the metformin before trying the ozempic if possible.

## 2022-06-15 MED ORDER — METFORMIN HCL 500 MG PO TABS: 500.0000 mg | ORAL_TABLET | Freq: Two times a day (BID) | ORAL | 3 refills | Status: DC

## 2022-06-15 NOTE — Addendum Note (Signed)
Addended by: Anabel Halon on: 06/15/2022 07:56 AM   Modules accepted: Orders

## 2022-06-26 ENCOUNTER — Telehealth: Payer: Self-pay | Admitting: Medical

## 2022-06-26 MED ORDER — TADALAFIL 5 MG PO TABS: ORAL_TABLET | ORAL | 3 refills | Status: DC

## 2022-06-26 NOTE — Addendum Note (Signed)
Addended by: Anabel Halon on: 06/26/2022 06:45 PM   Modules accepted: Orders

## 2022-06-26 NOTE — Telephone Encounter (Signed)
Patient called to find out if increasing his metformin (from 500 mg to '1000mg'$ ) would cause the side effect of him not getting an erection. He did not have that issues prior to increasing his dosage. Patient would also like Cialis to be called in for him to the [facility] in [place]. Please call to advise

## 2022-08-22 ENCOUNTER — Ambulatory Visit: Payer: Self-pay | Admitting: Internal Medicine

## 2022-08-26 DIAGNOSIS — J479 Bronchiectasis, uncomplicated: Secondary | ICD-10-CM | POA: Diagnosis not present

## 2022-10-09 ENCOUNTER — Other Ambulatory Visit (HOSPITAL_COMMUNITY): Payer: Self-pay | Admitting: Pulmonary Disease

## 2022-10-09 DIAGNOSIS — J479 Bronchiectasis, uncomplicated: Secondary | ICD-10-CM

## 2022-10-24 ENCOUNTER — Ambulatory Visit (HOSPITAL_BASED_OUTPATIENT_CLINIC_OR_DEPARTMENT_OTHER)
Admission: RE | Admit: 2022-10-24 | Discharge: 2022-10-24 | Disposition: A | Discharge status: Home / Self Care | Payer: BC Managed Care – PPO | Source: Ambulatory Visit | Attending: Medical | Admitting: Medical

## 2022-10-24 ENCOUNTER — Ambulatory Visit: Payer: BC Managed Care – PPO | Admitting: Medical

## 2022-10-24 ENCOUNTER — Ambulatory Visit (HOSPITAL_COMMUNITY): Payer: BC Managed Care – PPO

## 2022-10-24 ENCOUNTER — Encounter (HOSPITAL_COMMUNITY): Payer: Self-pay

## 2022-10-24 VITALS — BP 130/70 | HR 76 | Resp 18 | Ht 74.0 in | Wt 304.4 lb

## 2022-10-24 DIAGNOSIS — M25552 Pain in left hip: Secondary | ICD-10-CM

## 2022-10-24 DIAGNOSIS — Z7984 Long term (current) use of oral hypoglycemic drugs: Secondary | ICD-10-CM

## 2022-10-24 DIAGNOSIS — R600 Localized edema: Secondary | ICD-10-CM

## 2022-10-24 DIAGNOSIS — Z880 Allergy status to penicillin: Secondary | ICD-10-CM | POA: Diagnosis not present

## 2022-10-24 DIAGNOSIS — R918 Other nonspecific abnormal finding of lung field: Secondary | ICD-10-CM | POA: Diagnosis not present

## 2022-10-24 DIAGNOSIS — Z862 Personal history of diseases of the blood and blood-forming organs and certain disorders involving the immune mechanism: Secondary | ICD-10-CM | POA: Diagnosis not present

## 2022-10-24 DIAGNOSIS — J479 Bronchiectasis, uncomplicated: Secondary | ICD-10-CM | POA: Diagnosis not present

## 2022-10-24 DIAGNOSIS — D869 Sarcoidosis, unspecified: Secondary | ICD-10-CM | POA: Diagnosis not present

## 2022-10-24 DIAGNOSIS — M7989 Other specified soft tissue disorders: Secondary | ICD-10-CM | POA: Diagnosis not present

## 2022-10-24 DIAGNOSIS — Q334 Congenital bronchiectasis: Secondary | ICD-10-CM | POA: Diagnosis not present

## 2022-10-24 DIAGNOSIS — E118 Type 2 diabetes mellitus with unspecified complications: Secondary | ICD-10-CM

## 2022-10-24 NOTE — Progress Notes (Signed)
Subjective:    Patient ID: Jackson Hatfield, male    DOB: 08-May-1971, 52 y.o.   MRN: [id number]  HPI Pt in for follow up.  Pt just had Bronchoscopy.   He state left lower leg has been swollen for 4-6 weeks. No chest pain. No orthopnea.  Also has having some left lower back/ more  superior pelvis area  and hip pain.   Pt is truck driver. No abnormal shortness of breath from baseline however his baseline is daily dyspnea as he has bronchietasis.   Pt drives truck. 10-13 hours a day. He stops every 3-4 hours.   Pt wants letter stating emotional support dog. Pt does have some stress related to his chronic lung issues. Overall handling stress well.    Review of Systems  Constitutional:  Negative for chills, fatigue and fever.  HENT:  Negative for congestion and ear discharge.   Respiratory:  Negative for cough, chest tightness, shortness of breath and wheezing.   Genitourinary:  Negative for dysuria, flank pain, frequency and genital sores.  Musculoskeletal:  Negative for back pain, gait problem and joint swelling.  Skin:  Negative for rash.  Neurological:  Negative for seizures, facial asymmetry and headaches.  Hematological:  Negative for adenopathy. Does not bruise/bleed easily.  Psychiatric/Behavioral:  Negative for behavioral problems, confusion, hallucinations and suicidal ideas. The patient is not nervous/anxious.     Past Medical History:  Diagnosis Date   Blood transfusion without reported diagnosis    many years ago   Diabetes mellitus without complication (HCC)    Dyspnea    exerting self,ie: running   GERD (gastroesophageal reflux disease)    ? medicine related   Hyperlipidemia    Hypertension    Pneumonia    Pre-diabetes    Sarcoidosis    Sleep apnea    wears cpap     Social History   Socioeconomic History   Marital status: Divorced    Spouse name: Not on file   Number of children: Not on file   Years of education: Not on file   Highest education  level: Not on file  Occupational History   Not on file  Tobacco Use   Smoking status: Never   Smokeless tobacco: Never  Vaping Use   Vaping Use: Never used  Substance and Sexual Activity   Alcohol use: Yes    Comment: ocassionally   Drug use: No   Sexual activity: Not on file  Other Topics Concern   Not on file  Social History Narrative   Not on file   Social Determinants of Health   Financial Resource Strain: Not on file  Food Insecurity: Not on file  Transportation Needs: Not on file  Physical Activity: Not on file  Stress: Not on file  Social Connections: Not on file  Intimate Partner Violence: Not on file    Past Surgical History:  Procedure Laterality Date   BRONCHIAL WASHINGS  04/26/2021   Procedure: BRONCHIAL WASHINGS;  Surgeon: Martina Sinner, MD;  Location: [facility];  Service: Pulmonary;;   KNEE ARTHROSCOPY WITH MEDIAL MENISECTOMY Right 06/18/2018   Procedure: Right knee arthroscopy, partial medial menisectomy, debridement and drainage of cyst;  Surgeon: Jene Every, MD;  Location: [facility];  Service: Orthopedics;  Laterality: Right;  60 mins   LOBECTOMY Left 2002   lower left  ( cyst on the lobe)in [facility] in [place], [place]   VIDEO BRONCHOSCOPY N/A 04/26/2021   Procedure: FLEX BRONCHOSCOPY WITHOUT FLUORO;  Surgeon: Martina Sinner, MD;  Location: [facility];  Service: Pulmonary;  Laterality: N/A;    Family History  Problem Relation Age of Onset   COPD Mother    Alcohol abuse Father    Arthritis Father    Diabetes Father    Heart disease Father    Hyperlipidemia Father    Kidney disease Father    Diabetes Brother    Stroke Brother    Alcohol abuse Brother    Diabetes Brother    Drug abuse Brother    Stomach cancer Paternal Uncle    Colon cancer Neg Hx    Colon polyps Neg Hx    Esophageal cancer Neg Hx    Rectal cancer Neg Hx     Allergies  Allergen Reactions   Naproxen Hives   Penicillin G     Current Outpatient  Medications on File Prior to Visit  Medication Sig Dispense Refill   albuterol (PROVENTIL) (2.5 MG/3ML) 0.083% nebulizer solution Take 3 mLs (2.5 mg total) by nebulization 3 (three) times daily as needed for wheezing or shortness of breath. (Patient not taking: Reported on 03/04/2022) 30 mL 5   albuterol (VENTOLIN HFA) 108 (90 Base) MCG/ACT inhaler INHALE 2 PUFFS INTO THE LUNGS EVERY 4 HOURS AS NEEDED FOR WHEEZING OR SHORTNESS OF BREATH. 6.7 each 2   amLODipine (NORVASC) 5 MG tablet TAKE 1 TABLET (5 MG TOTAL) BY MOUTH DAILY. 90 tablet 1   atorvastatin (LIPITOR) 10 MG tablet Take 1 tablet (10 mg total) by mouth daily. 30 tablet 11   Chlorphen-Pseudoephed-APAP (CORICIDIN D PO) Take by mouth.     doxycycline (VIBRAMYCIN) 100 MG capsule Take 1 capsule (100 mg total) by mouth 2 (two) times daily. 14 capsule 0   losartan (COZAAR) 100 MG tablet Take 1 tablet (100 mg total) by mouth daily. 90 tablet 1   metFORMIN (GLUCOPHAGE) 500 MG tablet Take 1 tablet (500 mg total) by mouth 2 (two) times daily with a meal. 180 tablet 3   metoprolol tartrate (LOPRESSOR) 50 MG tablet Take 2 hours before CT scan (Patient not taking: Reported on 03/04/2022) 1 tablet 0   Multiple Vitamins-Minerals (MENS ONE DAILY PO) Take 1 tablet by mouth daily.      oxyCODONE-acetaminophen (PERCOCET/ROXICET) 5-325 MG tablet Take 1 tablet by mouth every 6 (six) hours as needed for severe pain. (Patient not taking: Reported on 03/17/2022) 15 tablet 0   predniSONE (DELTASONE) 20 MG tablet Take 2 tablets (40 mg total) by mouth daily with breakfast. 10 tablet 0   promethazine-dextromethorphan (PROMETHAZINE-DM) 6.25-15 MG/5ML syrup Take 5 mLs by mouth 4 (four) times daily as needed. 100 mL 0   sodium chloride HYPERTONIC 3 % nebulizer solution Take by nebulization 3 (three) times daily. Use flutter valve after each nebulizer use (Patient not taking: Reported on 03/04/2022) 750 mL 12   tadalafil (CIALIS) 5 MG tablet 1 tab po prn prior to sex. 30 tablet  3   tamsulosin (FLOMAX) 0.4 MG CAPS capsule Take 1 capsule (0.4 mg total) by mouth daily after supper. 90 capsule 1   No current facility-administered medications on file prior to visit.    BP 130/70   Pulse 76   Resp 18   Ht 6\' 2"  (1.88 m)   Wt (!) 304 lb 6.4 oz (138.1 kg)   SpO2 94%   BMI 39.08 kg/m        Objective:   Physical Exam  General Mental Status- Alert. General Appearance- Not in acute distress.  Skin General: Color- Normal Color. Moisture- Normal Moisture.  Neck Carotid Arteries- Normal color. Moisture- Normal Moisture. No carotid bruits. No JVD.  Chest and Lung Exam Auscultation: Breath Sounds:-Normal.  Cardiovascular Auscultation:Rythm- Regular. Murmurs & Other Heart Sounds:Auscultation of the heart reveals- No Murmurs.  Abdomen Inspection:-Inspeection Normal. Palpation/Percussion:Note:No mass. Palpation and Percussion of the abdomen reveal- Non Tender, Non Distended + BS, no rebound or guarding.  Neurologic Cranial Nerve exam:- CN III-XII intact(No nystagmus), symmetric smile. Strength:- 5/5 equal and symmetric strength both upper and lower extremities.   Left lower ext- small area of swelling distal tibia about 1+. Upper calf symmetric bilaterally. Negative homans signs.   Back - no mid lumbar tendernes. Left hip- mild pain on palpation. No pain on rom.mild pain over mid portion of left iliac crest.    Assessment & Plan:   Patient Instructions  Left lower extremity swelling over the last 4 to 6 weeks and occupation as a Naval architect.  In light of this placed order for left lower extremity ultrasound.  Testing scheduled for 6 PM.  Results should be redness data.  I will update you by MyChart on the result.  If positive for DVT may need to call you.  Diabetes-we will get A1c and CMP.  Continue current regimen.  Might need to adjust treatment based on the lab results.  Discussion on your request for emotional support animal letter.   Left hip  region pain. Will get xray to assess joint space. Evaluate if you decreased joint space. Then decide on treamtment.  Follow-up date to be determined based on lab and imaging review.    Esperanza Richters, PA-C   Time spent with patient today was 43  minutes which consisted of chart review, discussing diagnosis,  arranging stat US to be done lat in day, work up treatment and documentation.

## 2022-10-24 NOTE — Patient Instructions (Addendum)
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  In light of this placed order for left lower extremity ultrasound.  Testing scheduled for 6 PM.  Results should be redness data.  I will update you by MyChart on the result.  If positive for DVT may need to call you.  Diabetes-we will get A1c and CMP.  Continue current regimen.  Might need to adjust treatment based on the lab results.  Discussion on your request for emotional support animal letter.   Left hip region pain. Will get xray to assess joint space. Evaluate if you decreased joint space. Then decide on treatment.  Follow-up date to be determined based on lab and imaging review.

## 2022-10-25 LAB — COMPREHENSIVE METABOLIC PANEL
AG Ratio: 1.8 (calc) (ref 1.0–2.5)
ALT: 20 U/L (ref 9–46)
AST: 16 U/L (ref 10–35)
Albumin: 4.3 g/dL (ref 3.6–5.1)
Alkaline phosphatase (APISO): 81 U/L (ref 35–144)
BUN: 18 mg/dL (ref 7–25)
CO2: 28 mmol/L (ref 20–32)
Calcium: 9.1 mg/dL (ref 8.6–10.3)
Chloride: 104 mmol/L (ref 98–110)
Creat: 0.77 mg/dL (ref 0.70–1.30)
Globulin: 2.4 g/dL (calc) (ref 1.9–3.7)
Glucose, Bld: 161 mg/dL — ABNORMAL HIGH (ref 65–99)
Potassium: 4 mmol/L (ref 3.5–5.3)
Sodium: 141 mmol/L (ref 135–146)
Total Bilirubin: 0.3 mg/dL (ref 0.2–1.2)
Total Protein: 6.7 g/dL (ref 6.1–8.1)

## 2022-10-25 LAB — HEMOGLOBIN A1C
Hgb A1c MFr Bld: 7 % of total Hgb — ABNORMAL HIGH (ref <5.7)
Mean Plasma Glucose: 154 mg/dL
eAG (mmol/L): 8.5 mmol/L

## 2022-11-27 ENCOUNTER — Other Ambulatory Visit: Payer: Self-pay | Admitting: Medical

## 2022-11-28 ENCOUNTER — Other Ambulatory Visit: Payer: Self-pay | Admitting: Medical

## 2022-12-18 ENCOUNTER — Other Ambulatory Visit: Payer: Self-pay | Admitting: Medical

## 2022-12-19 ENCOUNTER — Other Ambulatory Visit: Payer: Self-pay | Admitting: Medical

## 2022-12-19 DIAGNOSIS — N401 Enlarged prostate with lower urinary tract symptoms: Secondary | ICD-10-CM

## 2022-12-23 NOTE — Progress Notes (Signed)
Name: Jackson Hatfield DOB: 02-25-1971 MRN: [id number]  History of Present Illness: Jackson Hatfield is a 52 y.o. male who presents today as a new patient at [facility]. All available relevant medical records have been reviewed. He is accompanied by his Jackson Hatfield. He is a Charity fundraiser.  He reports chief complaint of incomplete bladder emptying. He denies urinary hesitancy or straining to void. He reports solid urinary stream. He reports urinary urgency, frequency, nocturia, and occasional urge incontinence. Denies dysuria or gross hematuria. Reports voiding 8-9x/day and 3x/night. Reports caffeine intake (several cups of coffee each morning). Denies flank pain. He is taking Flomax but denies any noticeable improvement in his urinary symptoms with that. He reports nightly use of CPAP for OSA.   He reports bothersome erectile dysfunction. He states he is able to get an erection which is strong enough for penetrative intercourse. He states he does not lose his erection during intercourse. He states he does not get morning erections. He states his ejaculations are normal, non-painful, and without discoloration.  He denies history of hypogonadism / low testosterone.  He reports unusual fatigue, decreased sexual desire/ libido, decreased body hair, decreased muscle mass, weakness, mood changes, hot flashes. He denies  history of GU surgery. He reports that he has previously tried Tadalafil (Cialis) a few times without symptomatic improvement.  He denies taking nitrates. He denies history of Ml, stroke, or life-threatening arrhythmia in past 6 months. He reports history of diabetes (last A1c = 7.0 on 10/24/2022); denies neuropathy/numbness/tingling in lower extremities/feet.  He reports that his partner has herpes; he would like testing. He denies ever having a breakout / rash.   He reports family history of prostate cancer (maternal uncle and paternal uncle).  PSA values: -  08/03/2018: 1.73 - 12/28/2020: 2.48 - 01/24/2022: 1.46   Fall Screening: Do you usually have a device to assist in your mobility? No   Medications: Current Outpatient Medications  Medication Sig Dispense Refill   albuterol (PROVENTIL) (2.5 MG/3ML) 0.083% nebulizer solution Take 3 mLs (2.5 mg total) by nebulization 3 (three) times daily as needed for wheezing or shortness of breath. 30 mL 5   albuterol (VENTOLIN HFA) 108 (90 Base) MCG/ACT inhaler INHALE 2 PUFFS INTO THE LUNGS EVERY 4 HOURS AS NEEDED FOR WHEEZING OR SHORTNESS OF BREATH. 6.7 each 2   amLODipine (NORVASC) 5 MG tablet Take 1 tablet (5 mg total) by mouth daily. 90 tablet 0   atorvastatin (LIPITOR) 10 MG tablet TAKE 1 TABLET BY MOUTH EVERY DAY 90 tablet 3   Chlorphen-Pseudoephed-APAP (CORICIDIN D PO) Take by mouth.     doxycycline (VIBRAMYCIN) 100 MG capsule Take 1 capsule (100 mg total) by mouth 2 (two) times daily. 14 capsule 0   losartan (COZAAR) 100 MG tablet TAKE 1 TABLET BY MOUTH EVERY DAY 90 tablet 1   metFORMIN (GLUCOPHAGE) 500 MG tablet Take 1 tablet (500 mg total) by mouth 2 (two) times daily with a meal. 180 tablet 3   metoprolol tartrate (LOPRESSOR) 50 MG tablet Take 2 hours before CT scan 1 tablet 0   Multiple Vitamins-Minerals (MENS ONE DAILY PO) Take 1 tablet by mouth daily.      predniSONE (DELTASONE) 20 MG tablet Take 2 tablets (40 mg total) by mouth daily with breakfast. 10 tablet 0   promethazine-dextromethorphan (PROMETHAZINE-DM) 6.25-15 MG/5ML syrup Take 5 mLs by mouth 4 (four) times daily as needed. 100 mL 0   sodium chloride HYPERTONIC 3 % nebulizer solution Take  by nebulization 3 (three) times daily. Use flutter valve after each nebulizer use 750 mL 12   tadalafil (CIALIS) 5 MG tablet Take 1 tablet (5 mg total) by mouth daily. Take one 5 mg tablet once daily at approximately the same time each day without regard to timing of sexual activity. 30 tablet 5   tamsulosin (FLOMAX) 0.4 MG CAPS capsule Take 1  capsule (0.4 mg total) by mouth in the morning and at bedtime. 60 capsule 11   oxyCODONE-acetaminophen (PERCOCET/ROXICET) 5-325 MG tablet Take 1 tablet by mouth every 6 (six) hours as needed for severe pain. (Patient not taking: Reported on 12/29/2022) 15 tablet 0   No current facility-administered medications for this visit.    Allergies: Allergies  Allergen Reactions   Naproxen Hives   Penicillin G     Past Medical History:  Diagnosis Date   Blood transfusion without reported diagnosis    many years ago   Diabetes mellitus without complication (HCC)    Dyspnea    exerting self,ie: running   GERD (gastroesophageal reflux disease)    ? medicine related   Hyperlipidemia    Hypertension    Pneumonia    Pre-diabetes    Sarcoidosis    Sleep apnea    wears cpap   Past Surgical History:  Procedure Laterality Date   BRONCHIAL WASHINGS  04/26/2021   Procedure: BRONCHIAL WASHINGS;  Surgeon: Martina Sinner, MD;  Location: [facility];  Service: Pulmonary;;   KNEE ARTHROSCOPY WITH MEDIAL MENISECTOMY Right 06/18/2018   Procedure: Right knee arthroscopy, partial medial menisectomy, debridement and drainage of cyst;  Surgeon: Jene Every, MD;  Location: [facility];  Service: Orthopedics;  Laterality: Right;  60 mins   LOBECTOMY Left 2002   lower left  ( cyst on the lobe)in [facility] in [place], [place]   VIDEO BRONCHOSCOPY N/A 04/26/2021   Procedure: FLEX BRONCHOSCOPY WITHOUT FLUORO;  Surgeon: Martina Sinner, MD;  Location: [facility];  Service: Pulmonary;  Laterality: N/A;   Family History  Problem Relation Age of Onset   COPD Mother    Alcohol abuse Father    Arthritis Father    Diabetes Father    Heart disease Father    Hyperlipidemia Father    Kidney disease Father    Diabetes Brother    Stroke Brother    Alcohol abuse Brother    Diabetes Brother    Drug abuse Brother    Stomach cancer Paternal Uncle    Colon cancer Neg Hx    Colon polyps Neg Hx     Esophageal cancer Neg Hx    Rectal cancer Neg Hx    Social History   Socioeconomic History   Marital status: Divorced    Spouse name: Not on file   Number of children: Not on file   Years of education: Not on file   Highest education level: Not on file  Occupational History   Not on file  Tobacco Use   Smoking status: Former    Types: Cigars   Smokeless tobacco: Never  Vaping Use   Vaping Use: Never used  Substance and Sexual Activity   Alcohol use: Yes    Comment: ocassionally   Drug use: No   Sexual activity: Not on file  Other Topics Concern   Not on file  Social History Narrative   Not on file   Social Determinants of Health   Financial Resource Strain: Not on file  Food Insecurity: Not on file  Transportation  Needs: Not on file  Physical Activity: Not on file  Stress: Not on file  Social Connections: Not on file  Intimate Partner Violence: Not on file    SUBJECTIVE  Review of Systems Constitutional: Patient denies any unintentional weight loss or change in strength lntegumentary: Patient denies any rashes or pruritus Cardiovascular: Patient denies chest pain or syncope Respiratory: Patient denies shortness of breath Gastrointestinal: Patient denies nausea, vomiting, constipation, or diarrhea Musculoskeletal: Patient denies muscle cramps or weakness Neurologic: Patient denies convulsions or seizures Psychiatric: Patient denies memory problems Allergic/Immunologic: Patient denies recent allergic reaction(s) Hematologic/Lymphatic: Patient denies bleeding tendencies Endocrine: Patient denies heat/cold intolerance  GU: As per HPI.  OBJECTIVE Vitals:   12/29/22 0852  BP: 117/76  Pulse: (!) 106  Temp: 98.4 F (36.9 C)   Body mass index is 39.16 kg/m.  Physical Examination  Constitutional: No obvious distress; patient is non-toxic appearing  Cardiovascular: No visible lower extremity edema.  Respiratory: The patient does not have audible  wheezing/stridor; respirations do not appear labored  Gastrointestinal: Abdomen non-distended Musculoskeletal: Normal ROM of UEs  Skin: No obvious rashes/open sores  Neurologic: CN 2-12 grossly intact Psychiatric: Answered questions appropriately with normal affect  Hematologic/Lymphatic/Immunologic: No obvious bruises or sites of spontaneous bleeding  UA: negative PVR: 218 ml  ASSESSMENT Voiding dysfunction - Plan: Urinalysis, Routine w reflex microscopic, BLADDER SCAN AMB NON-IMAGING, tamsulosin (FLOMAX) 0.4 MG CAPS capsule, tadalafil (CIALIS) 5 MG tablet  Benign prostatic hyperplasia with incomplete bladder emptying - Plan: tamsulosin (FLOMAX) 0.4 MG CAPS capsule, tadalafil (CIALIS) 5 MG tablet  Erectile dysfunction, unspecified erectile dysfunction type - Plan: tadalafil (CIALIS) 5 MG tablet, CBC, Testosterone  Screen for STD (sexually transmitted disease) - Plan: HSV 1 and 2 Ab, IgG  Prostate cancer screening - Plan: PSA  Incomplete bladder emptying  1. BPH with LUTS and incomplete bladder emptying:  After careful review of all available data and history and physical examination, it is felt that this patient has symptomatic BPH/BOO.  - Agreed to continue Flomax; will increase to 2x/day. Discussed mechanism of action (relaxes smooth muscle of prostate and bladder neck to decrease BOO and improve urine flow). Fast acting. Potential side effects were discussed.  - Agreed to change Cialis (Tadalafil) 5 mg from PRN to daily use to help with both BPH and ED.  - Advised patient to minimize caffeine intake as that can worsen urinary frequency, urgency, and urge incontinence. - Discussed possible risks related to incomplete bladder emptying. Low concern given recent normal renal labs in May 2024 and absence of UTIs.   2. Erectile dysfunction. Thorough discussion was had with patient regarding possible etiologies of erectile dysfunction including psychological, vasculogenic, neurologic,  and testosterone deficiency. He has several risk factors for ED including HTN, T2DM, sarcoidosis, possible performance anxiety. We reviewed options for therapy including medication, vacuum pumps, penile injections, MUSE, and penile implants. Patient was advised that not all these options are likely to be covered by insurance. We will check testosterone levels today. Patient elected to proceed with Cialis 5 mg daily for ED and BPH; may consider using VED in addition to daily Cialis.   3. Prostate cancer screening. Considered to be low risk.  We discussed recommendation to check PSA value, which is a protein produced in normal and neoplastic cells. Will check PSA today.  4. Suspected herpes exposure: We agreed to proceed with HSV testing; he declines additional STD screening. Discussed option for treatment with Valtrex if rash occurs.   Will plan for follow  up in 3 months or sooner if needed. Pt verbalized understanding and agreement. All questions were answered.  PLAN Advised the following: 1. Labs (PSA, testosterone, CBC, HSV 1 & 2). 2. Take Flomax 0.4 mg 2x/day.  3. Take Cialis 5 mg daily.  4. Minimize caffeine intake. 5. Return in about 3 months (around 03/31/2023) for UA, PVR, & f/u with Evette Georges NP.  Orders Placed This Encounter  Procedures   Urinalysis, Routine w reflex microscopic   CBC   Testosterone   HSV 1 and 2 Ab, IgG   PSA   BLADDER SCAN AMB NON-IMAGING   Total time spent caring for the patient today was over 45 minutes. This includes time spent on the date of the visit reviewing the patient's chart before the visit, time spent during the visit, and time spent after the visit on documentation. Over 50% of that time was spent in face-to-face time with this patient for direct counseling. E&M based on time and complexity of medical decision making.  It has been explained that the patient is to follow regularly with their PCP in addition to all other providers involved in their  care and to follow instructions provided by these respective offices. Patient advised to contact urology clinic if any urologic-pertaining questions, concerns, new symptoms or problems arise in the interim period.  There are no Patient Instructions on file for this visit.  Electronically signed by:  Donnita Falls, MSN, FNP-C, CUNP 12/29/2022 10:01 AM

## 2022-12-29 ENCOUNTER — Encounter: Payer: Self-pay | Admitting: Urology

## 2022-12-29 ENCOUNTER — Ambulatory Visit: Payer: BC Managed Care – PPO | Admitting: Urology

## 2022-12-29 VITALS — BP 117/76 | HR 106 | Temp 98.4°F | Ht 74.0 in | Wt 305.0 lb

## 2022-12-29 DIAGNOSIS — N529 Male erectile dysfunction, unspecified: Secondary | ICD-10-CM | POA: Diagnosis not present

## 2022-12-29 DIAGNOSIS — R39198 Other difficulties with micturition: Secondary | ICD-10-CM | POA: Diagnosis not present

## 2022-12-29 DIAGNOSIS — Z125 Encounter for screening for malignant neoplasm of prostate: Secondary | ICD-10-CM

## 2022-12-29 DIAGNOSIS — N401 Enlarged prostate with lower urinary tract symptoms: Secondary | ICD-10-CM

## 2022-12-29 DIAGNOSIS — N398 Other specified disorders of urinary system: Secondary | ICD-10-CM

## 2022-12-29 DIAGNOSIS — Z113 Encounter for screening for infections with a predominantly sexual mode of transmission: Secondary | ICD-10-CM

## 2022-12-29 DIAGNOSIS — R3914 Feeling of incomplete bladder emptying: Secondary | ICD-10-CM

## 2022-12-29 DIAGNOSIS — R339 Retention of urine, unspecified: Secondary | ICD-10-CM

## 2022-12-29 LAB — URINALYSIS, ROUTINE W REFLEX MICROSCOPIC
Bilirubin, UA: NEGATIVE
Glucose, UA: NEGATIVE
Ketones, UA: NEGATIVE
Leukocytes,UA: NEGATIVE
Nitrite, UA: NEGATIVE
Protein,UA: NEGATIVE
RBC, UA: NEGATIVE
Specific Gravity, UA: <1.005 — ABNORMAL LOW (ref 1.005–1.030)
Urobilinogen, Ur: 0.2 mg/dL (ref 0.2–1.0)
pH, UA: 6.5 (ref 5.0–7.5)

## 2022-12-29 LAB — BLADDER SCAN AMB NON-IMAGING: Scan Result: 218

## 2022-12-29 MED ORDER — TADALAFIL 5 MG PO TABS
5.0000 mg | ORAL_TABLET | Freq: Every day | ORAL | 5 refills | Status: DC
Start: 2022-12-29 — End: 2023-01-22

## 2022-12-29 MED ORDER — TAMSULOSIN HCL 0.4 MG PO CAPS
0.4000 mg | ORAL_CAPSULE | Freq: Two times a day (BID) | ORAL | 11 refills | Status: DC
Start: 2022-12-29 — End: 2024-03-01

## 2022-12-29 NOTE — Progress Notes (Signed)
post void residual =[id number]

## 2022-12-30 LAB — CBC
Hematocrit: 42.5 % (ref 37.5–51.0)
Hemoglobin: 14.2 g/dL (ref 13.0–17.7)
MCH: 29.2 pg (ref 26.6–33.0)
MCHC: 33.4 g/dL (ref 31.5–35.7)
MCV: 87 fL (ref 79–97)
Platelets: 327 10*3/uL (ref 150–450)
RBC: 4.87 x10E6/uL (ref 4.14–5.80)
RDW: 13.4 % (ref 11.6–15.4)
WBC: 7.8 10*3/uL (ref 3.4–10.8)

## 2022-12-30 LAB — HSV 1 AND 2 AB, IGG
HSV 1 Glycoprotein G Ab, IgG: >62.2 index — ABNORMAL HIGH (ref 0.00–0.90)
HSV 2 IgG, Type Spec: <0.91 index (ref 0.00–0.90)

## 2022-12-30 LAB — TESTOSTERONE: Testosterone: 275 ng/dL (ref 264–916)

## 2022-12-30 NOTE — Progress Notes (Signed)
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 No need to treat unless symptoms arise (rash).

## 2022-12-31 LAB — PSA: Prostate Specific Ag, Serum: 2.4 ng/mL (ref 0.0–4.0)

## 2023-01-21 ENCOUNTER — Telehealth: Payer: Self-pay

## 2023-01-21 NOTE — Telephone Encounter (Signed)
Patient called in today about Cialis and states that the medication is $181 and want to know if there is another alternative with the same effects. Patient also states that his Testerone levels are low and want to know if he can get a shot to increase Testerone level or another alternative. Patient is aware a message will be sent to the provider on recommendation. Patient voiced understanding

## 2023-01-22 ENCOUNTER — Other Ambulatory Visit: Payer: Self-pay | Admitting: Urology

## 2023-01-22 DIAGNOSIS — N529 Male erectile dysfunction, unspecified: Secondary | ICD-10-CM

## 2023-01-22 MED ORDER — SILDENAFIL CITRATE 20 MG PO TABS
ORAL_TABLET | ORAL | 1 refills | Status: DC
Start: 2023-01-22 — End: 2023-03-06

## 2023-01-22 NOTE — Telephone Encounter (Signed)
Patient is made aware of Sarah recommendation "Please reschedule for 1st available appt with Dr. Ronne Binning for testosterone replacement therapy and f/u about his erectile dysfunction. In the meantime I can send in Viagra (Sildenafil) as an alternative to Cialis (Tadalafil) if he would like. No guarantee it will be any less costly however; insurance doesn't cover ED medications well. Getting his testosterone addressed with Dr. Ronne Binning will likely help improve his ED."  Patient states he would like to proceed with Viagra and will call us to let us know about price. Patient is made aware that he has been reschedule to see Dr. Ronne Binning for ED. Patient voiced understanding

## 2023-01-22 NOTE — Telephone Encounter (Signed)
Tried calling  patient with no answer. Rx sent. My chart message sent

## 2023-02-03 ENCOUNTER — Telehealth: Payer: Self-pay

## 2023-02-03 NOTE — Telephone Encounter (Signed)
Covermymeds key [id number] Started for Tadalafil -pending

## 2023-02-18 NOTE — Telephone Encounter (Signed)
Patient called he wants to know if he can get injections for ED since his insurance will not cover the medication ?  He is also inquiring about getting the testosterone shots in our office as well.  Please call

## 2023-02-18 NOTE — Telephone Encounter (Signed)
See below. Please advise.  

## 2023-02-20 NOTE — Telephone Encounter (Signed)
Patient calling back to speak with someone regarding answer to question.  He is requesting a call back today.

## 2023-02-25 ENCOUNTER — Other Ambulatory Visit: Payer: Self-pay

## 2023-02-25 MED ORDER — AMBULATORY NON FORMULARY MEDICATION: 5 refills | Status: DC

## 2023-02-26 ENCOUNTER — Other Ambulatory Visit: Payer: Self-pay | Admitting: Medical

## 2023-02-26 ENCOUNTER — Telehealth: Payer: Self-pay | Admitting: Medical

## 2023-02-26 NOTE — Telephone Encounter (Signed)
Pt called requesting an Rx for Athletes foot. Pt stated that Ramon Dredge has not treated this before and would not be able to come in for an appt due to work. Advised a message would be sent back to look into this for him. Pt agreed to schedule appt for 9.13.24 to follow up with Ramon Dredge.

## 2023-02-26 NOTE — Telephone Encounter (Signed)
Difficult to give exact treatment without office visit/seeing foot in person. If it is athletes feet one of best meds is terbanafine over the counter. Terbanafine cream is generic of lamisil. I think best price is probably through [facility]. He could look for the equate [facility] brand. There are other over the counter topical antifungals but terbanefine I think is better than others.

## 2023-02-27 IMAGING — CT CT ANGIO CHEST
2 of 7 series · 18 of 46 positions shown · IV contrast (Omnipaque or Isovue)
Comparison: 10/10/2021, 07/03/2021

CLINICAL DATA: Left-sided pleuritic chest pain, history of
sarcoidosis, abnormal chest x-ray

EXAM:
CT ANGIOGRAPHY CHEST WITH CONTRAST
TECHNIQUE: Multidetector CT imaging of the chest was performed using the
standard protocol during bolus administration of intravenous
contrast. Multiplanar CT image reconstructions and MIPs were
obtained to evaluate the vascular anatomy.

[Series 5: pe axial thins · axial · 0.88mm/px · z∈[+871,+1215]mm · 15 of 484 slices shown]
[im 27/484  lung]
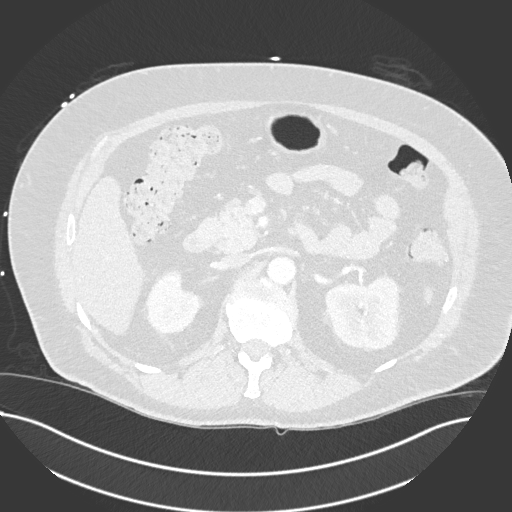
[im 54/484  soft-tissue]
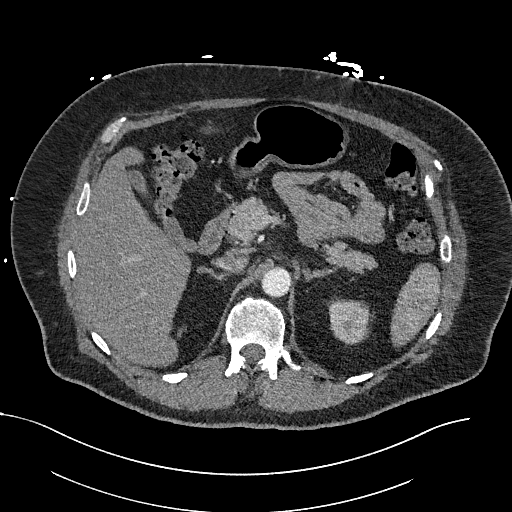
[im 81/484  lung]
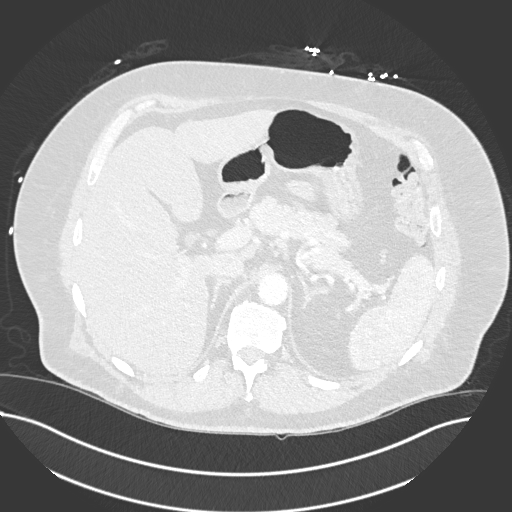
[im 108/484  soft-tissue]
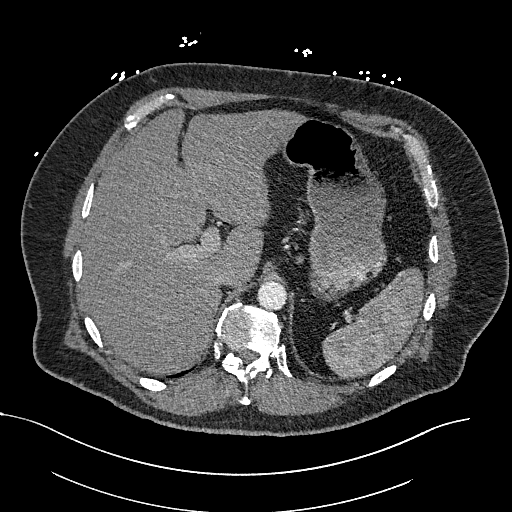
[im 162/484  lung]
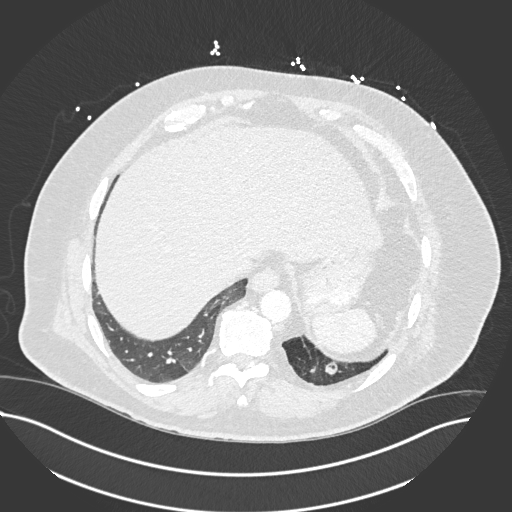
[im 188/484  soft-tissue]
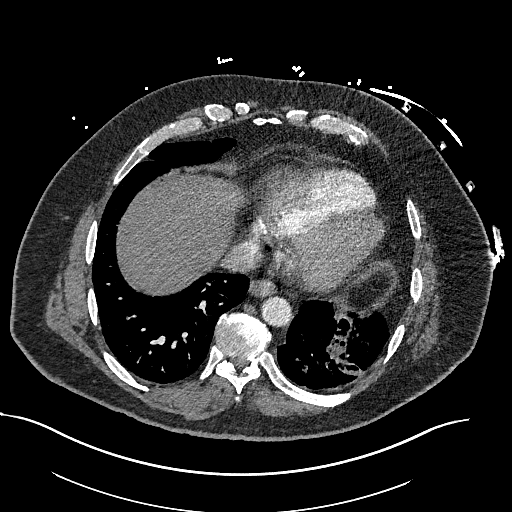
[im 215/484  lung]
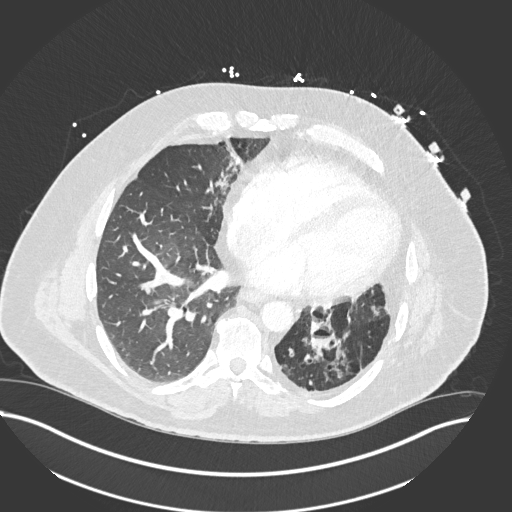
[im 242/484  soft-tissue]
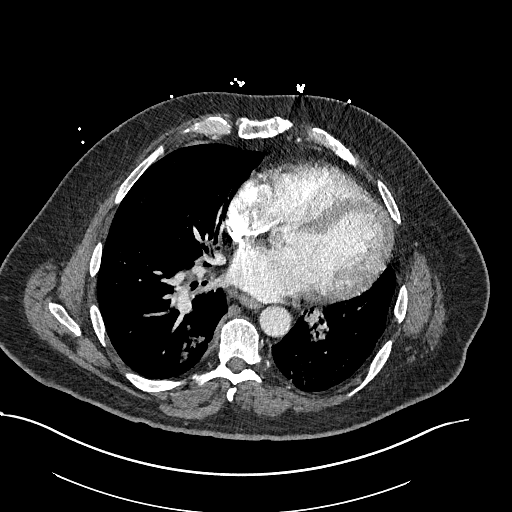
[im 269/484  lung]
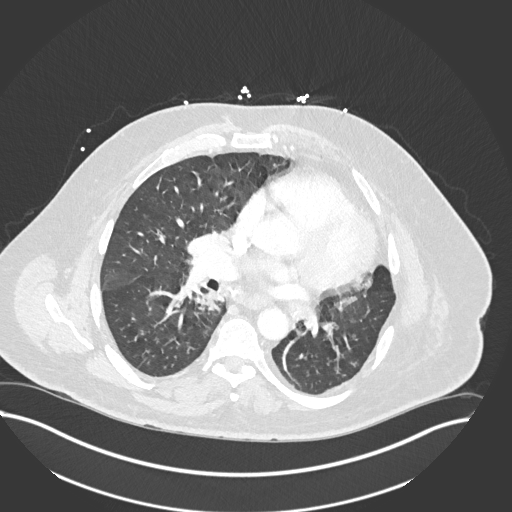
[im 296/484  soft-tissue]
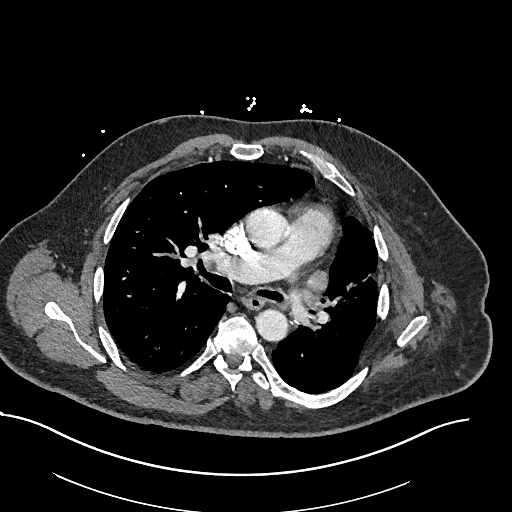
[im 323/484  lung]
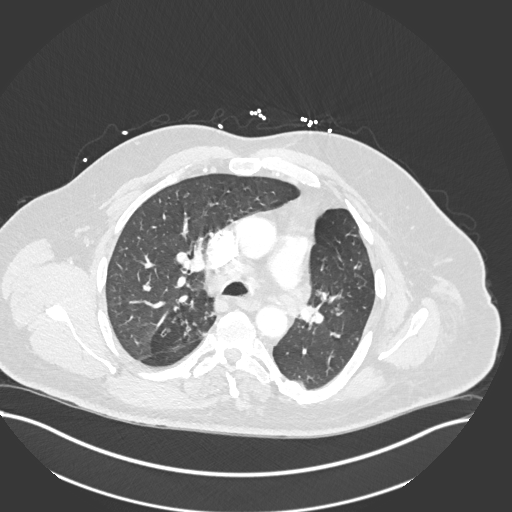
[im 376/484  soft-tissue]
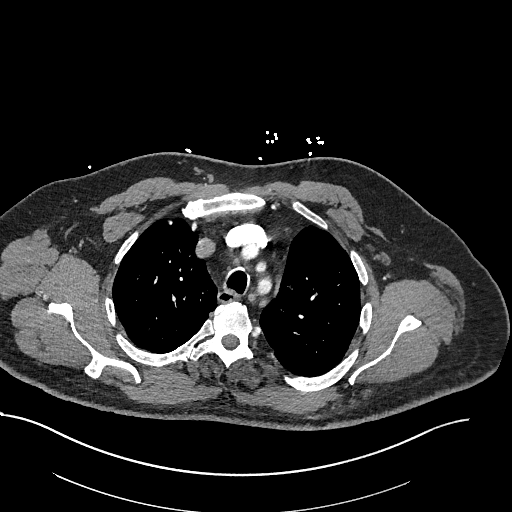
[im 403/484  lung]
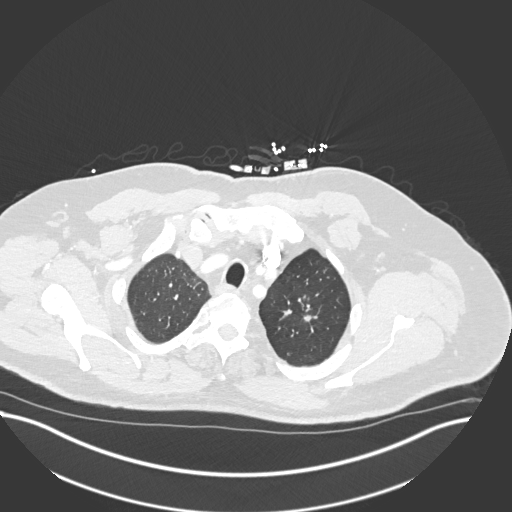
[im 430/484  soft-tissue]
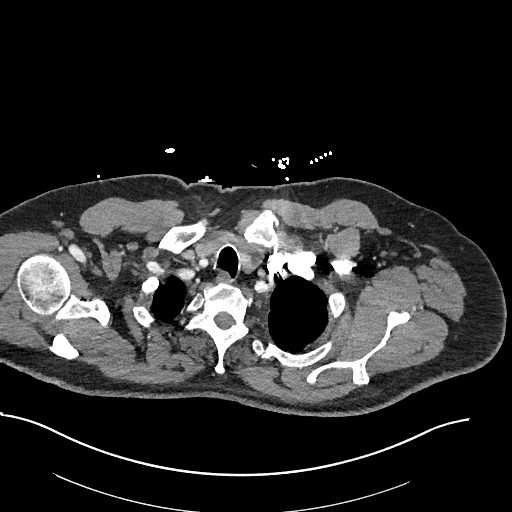
[im 457/484  lung]
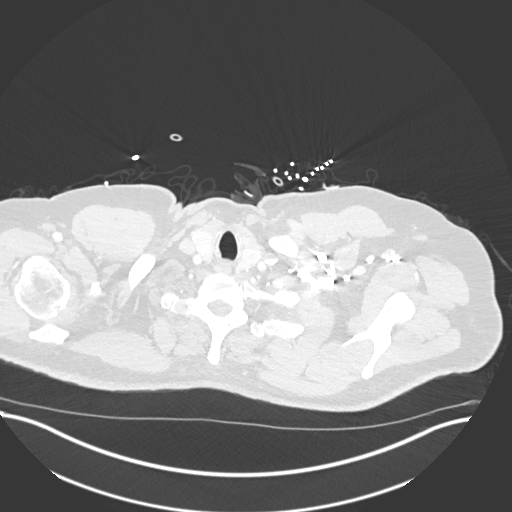

[Series 7: cor soft · coronal · 0.77mm/px · 3 of 174 slices shown]
[im 44/174  soft-tissue]
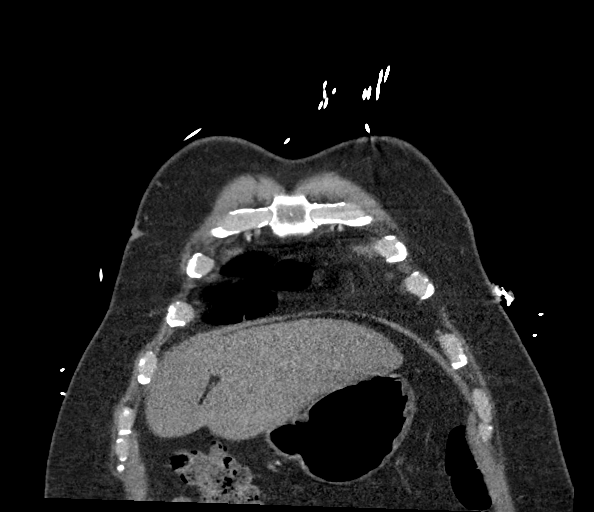
[im 87/174  soft-tissue]
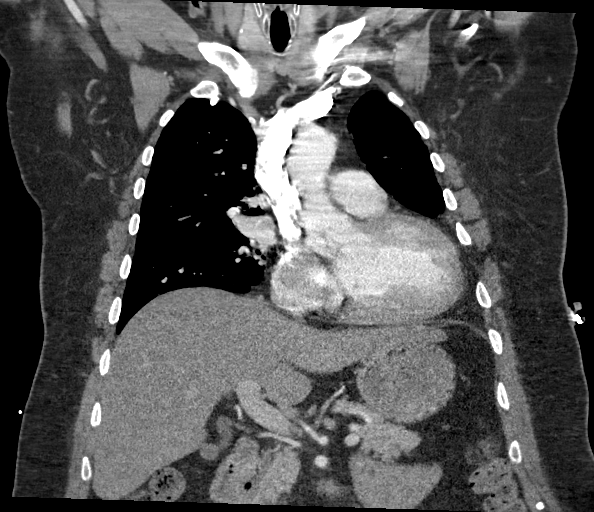
[im 130/174  soft-tissue]
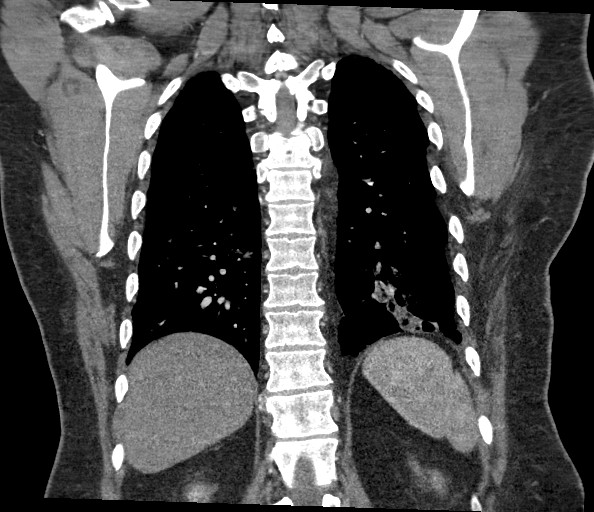

[18 of 46 positions shown; findings below may reference images not displayed]

RADIATION DOSE REDUCTION: This exam was performed according to the
departmental dose-optimization program which includes automated
exposure control, adjustment of the mA and/or kV according to
patient size and/or use of iterative reconstruction technique.

CONTRAST:  100mL OMNIPAQUE IOHEXOL 350 MG/ML SOLN
FINDINGS: Cardiovascular: This is a technically adequate evaluation of the
pulmonary vasculature. No filling defects or pulmonary emboli.

No pericardial effusion. Heart is unremarkable. No evidence of
thoracic aortic aneurysm or dissection.

Mediastinum/Nodes: Calcified mediastinal lymph nodes unchanged
consistent with history of sarcoidosis. Stable appearance of the
thyroid, trachea, and esophagus.

Lungs/Pleura: Postsurgical changes are seen from left lower
lobectomy. Cystic bronchiectasis within the left lung base again
noted, with bronchial wall thickening and fluid-filled bronchi more
pronounced in this region on prior study. Multifocal tree in bud
ground-glass nodular airspace disease again identified, increased
since prior exam. This is most pronounced within the superior
segment right lower lobe and bilateral upper lobes. No effusion or
pneumothorax.

Upper Abdomen: Hepatic steatosis.  No acute upper abdominal finding.

Musculoskeletal: No acute or destructive bony lesions. Reconstructed
images demonstrate no additional findings.

Review of the MIP images confirms the above findings.
IMPRESSION: 1. No evidence of pulmonary embolus.
2. Progressive multifocal bilateral tree-in-bud ground-glass
airspace disease, consistent with progressive infection,
inflammation, or sarcoidosis.
3. Cystic bronchiectasis within the inferior aspect of the left
upper lobe, with increased bronchial wall thickening and
fluid-filled bronchi since prior study, consistent with progressive
infection.
4. Left lower lobectomy.
5. Calcified mediastinal and hilar lymph nodes consistent with known
history of sarcoidosis. No significant change.
6. Hepatic steatosis.

## 2023-02-27 NOTE — Telephone Encounter (Signed)
Pt.notified

## 2023-03-06 ENCOUNTER — Ambulatory Visit: Payer: BC Managed Care – PPO | Admitting: Medical

## 2023-03-06 ENCOUNTER — Other Ambulatory Visit (HOSPITAL_BASED_OUTPATIENT_CLINIC_OR_DEPARTMENT_OTHER): Payer: Self-pay

## 2023-03-06 VITALS — BP 130/74 | HR 62 | Temp 97.8°F | Resp 18 | Ht 74.0 in | Wt 306.4 lb

## 2023-03-06 DIAGNOSIS — Z7984 Long term (current) use of oral hypoglycemic drugs: Secondary | ICD-10-CM

## 2023-03-06 DIAGNOSIS — B353 Tinea pedis: Secondary | ICD-10-CM | POA: Diagnosis not present

## 2023-03-06 DIAGNOSIS — L603 Nail dystrophy: Secondary | ICD-10-CM

## 2023-03-06 DIAGNOSIS — Z7985 Long-term (current) use of injectable non-insulin antidiabetic drugs: Secondary | ICD-10-CM

## 2023-03-06 DIAGNOSIS — G629 Polyneuropathy, unspecified: Secondary | ICD-10-CM

## 2023-03-06 DIAGNOSIS — N529 Male erectile dysfunction, unspecified: Secondary | ICD-10-CM

## 2023-03-06 DIAGNOSIS — E118 Type 2 diabetes mellitus with unspecified complications: Secondary | ICD-10-CM

## 2023-03-06 MED ORDER — SILDENAFIL CITRATE 20 MG PO TABS
ORAL_TABLET | ORAL | 1 refills | Status: DC
Start: 2023-03-06 — End: 2023-03-06

## 2023-03-06 MED ORDER — SILDENAFIL CITRATE 20 MG PO TABS
ORAL_TABLET | ORAL | 1 refills | Status: DC
Start: 2023-03-06 — End: 2023-07-19

## 2023-03-06 MED ORDER — SILDENAFIL CITRATE 20 MG PO TABS
40.0000 mg | ORAL_TABLET | Freq: Once | ORAL | 1 refills | Status: AC
  Filled 2023-03-06: qty 60, 12d supply, fill #0

## 2023-03-06 MED ORDER — GABAPENTIN 300 MG PO CAPS: 300.0000 mg | ORAL_CAPSULE | Freq: Every day | ORAL | 0 refills | Status: DC

## 2023-03-06 NOTE — Progress Notes (Signed)
Subjective:    Patient ID: Jackson Hatfield, male    DOB: 1971-03-08, 52 y.o.   MRN: [id number]  HPI  Pt in for some left feet that feel like on fire at night. His feet do itch at night. Also some rash on rt groin area. Has itched and burned past 3 months.  Patient recently indicates he tried terbinafine over-the-counter for the itching but it did not help.  Also he notes that he has thick toenails on both feet.  Pt states he has some ED happening very frequently just recently. Pt states urologist rx sildanefil 20 mg 2-5 tab prn. Pt states the rx was $180.  He could not afford the price as this was excessive.  Also indicates that recently found to have low testosterone.  He also mention that he has not started the injections yet as it is too expensive as well.   Diabetes-  last A1c was 7. Pt is on metformin. 500 mg twice daily. Pt wants to consider ozempic and discussed about contraindications.  He is going to consider Ozempic due to his obesity and desire to lose weight.  On review as stated no family history of thyroid cancer and patient and she has no history of pancreatitis.  Also does not describe symptoms of gastroparesis.   .   Review of Systems See hpi    Objective:   Physical Exam General Mental Status- Alert. General Appearance- Not in acute distress.   Skin General: Color- Normal Color. Moisture- Normal Moisture.  Neck Carotid Arteries- Normal color. Moisture- Normal Moisture. No carotid bruits. No JVD.  Chest and Lung Exam Auscultation: Breath Sounds:-Normal.  Cardiovascular Auscultation:Rythm- Regular. Murmurs & Other Heart Sounds:Auscultation of the heart reveals- No Murmurs.  Abdomen Inspection:-Inspeection Normal. Palpation/Percussion:Note:No mass. Palpation and Percussion of the abdomen reveal- Non Tender, Non Distended + BS, no rebound or guarding.    Neurologic Cranial Nerve exam:- CN III-XII intact(No nystagmus), symmetric smile. Strength:- 5/5  equal and symmetric strength both upper and lower extremities.   Lower extremity-see quality metrics.  No breakdown of the skin.  Normal sensation intact.  Normal pulses.  Very thick disfigured white appearance to various nails on both of feet.       Assessment & Plan:   Patient Instructions  1. Controlled type 2 diabetes mellitus with complication, without long-term current use of insulin (HCC) Diabetes with last A1c of 7.  Currently on metformin.  Will follow labs and consider Ozempic to help control sugars if needed and to help with weight loss.  I will update you on the labs when results are in. - CBC w/Diff - Comp Met (CMET) - HgB A1c  2. Tinea pedis of both feet Possible fungal infection but no response to terbinafine. -Follow results of fungal stain and liver enzymes.  If fungal stain positive and liver enzymes normal then go ahead and prescribe Lamisil.  Will contribute to fungus and nails and the skin.  3. Dystrophic nail -Same as above plan for above - Fungal Stain  4. Neuropathy -Possible neuropathy as you describe burning to feet at night.  Prescribed gabapentin 300 mg to use 1 tablet at night.  Rx advisement given.  May help you sleep.  5. Erectile dysfunction, unspecified erectile dysfunction type -I discussed that the price might be cheaper at [facility].  I provided you with a printed prescription to investigate local cash price.  Hopefully you can find a reasonable price. - sildenafil (REVATIO) 20 MG tablet; Take 2 -  5 tablets (40 mg - 100 mg) as one single dose at least 60 minutes prior to anticipated sexual activity to achieve erection. Do not use more than once per day.  Dispense: 60 tablet; Refill: 1   Follow-up date to be determined after lab review.   Time spent with patient today was 45  minutes which consisted of chart revdiew, discussing diagnosis, work up treatment and documentation.

## 2023-03-06 NOTE — Patient Instructions (Signed)
1. Controlled type 2 diabetes mellitus with complication, without long-term current use of insulin (HCC) Diabetes with last A1c of 7.  Currently on metformin.  Will follow labs and consider Ozempic to help control sugars if needed and to help with weight loss.  I will update you on the labs when results are in. - CBC w/Diff - Comp Met (CMET) - HgB A1c  2. Tinea pedis of both feet Possible fungal infection but no response to terbinafine. -Follow results of fungal stain and liver enzymes.  If fungal stain positive and liver enzymes normal then go ahead and prescribe Lamisil.  Will contribute to fungus and nails and the skin.  3. Dystrophic nail -Same as above plan for above - Fungal Stain  4. Neuropathy -Possible neuropathy as you describe burning to feet at night.  Prescribed gabapentin 300 mg to use 1 tablet at night.  Rx advisement given.  May help you sleep.  5. Erectile dysfunction, unspecified erectile dysfunction type -I discussed that the price might be cheaper at [facility].  I provided you with a printed prescription to investigate local cash price.  Hopefully you can find a reasonable price. - sildenafil (REVATIO) 20 MG tablet; Take 2 - 5 tablets (40 mg - 100 mg) as one single dose at least 60 minutes prior to anticipated sexual activity to achieve erection. Do not use more than once per day.  Dispense: 60 tablet; Refill: 1   Follow-up date to be determined after lab review.

## 2023-03-07 LAB — CBC WITH DIFFERENTIAL/PLATELET
Absolute Monocytes: 821 {cells}/uL (ref 200–950)
Basophils Absolute: 68 {cells}/uL (ref 0–200)
Basophils Relative: 0.9 %
Eosinophils Absolute: 418 {cells}/uL (ref 15–500)
Eosinophils Relative: 5.5 %
HCT: 38.7 % (ref 38.5–50.0)
Hemoglobin: 12.9 g/dL — ABNORMAL LOW (ref 13.2–17.1)
Lymphs Abs: 2250 {cells}/uL (ref 850–3900)
MCH: 28.5 pg (ref 27.0–33.0)
MCHC: 33.3 g/dL (ref 32.0–36.0)
MCV: 85.6 fL (ref 80.0–100.0)
MPV: 10.4 fL (ref 7.5–12.5)
Monocytes Relative: 10.8 %
Neutro Abs: 4043 {cells}/uL (ref 1500–7800)
Neutrophils Relative %: 53.2 %
Platelets: 321 10*3/uL (ref 140–400)
RBC: 4.52 10*6/uL (ref 4.20–5.80)
RDW: 13.2 % (ref 11.0–15.0)
Total Lymphocyte: 29.6 %
WBC: 7.6 10*3/uL (ref 3.8–10.8)

## 2023-03-07 LAB — COMPREHENSIVE METABOLIC PANEL
AG Ratio: 1.3 (calc) (ref 1.0–2.5)
ALT: 27 U/L (ref 9–46)
AST: 23 U/L (ref 10–35)
Albumin: 4.1 g/dL (ref 3.6–5.1)
Alkaline phosphatase (APISO): 84 U/L (ref 35–144)
BUN: 15 mg/dL (ref 7–25)
CO2: 24 mmol/L (ref 20–32)
Calcium: 9.2 mg/dL (ref 8.6–10.3)
Chloride: 101 mmol/L (ref 98–110)
Creat: 0.78 mg/dL (ref 0.70–1.30)
Globulin: 3.1 g/dL (ref 1.9–3.7)
Glucose, Bld: 174 mg/dL — ABNORMAL HIGH (ref 65–99)
Potassium: 4.4 mmol/L (ref 3.5–5.3)
Sodium: 136 mmol/L (ref 135–146)
Total Bilirubin: 0.3 mg/dL (ref 0.2–1.2)
Total Protein: 7.2 g/dL (ref 6.1–8.1)

## 2023-03-07 LAB — HEMOGLOBIN A1C
Hgb A1c MFr Bld: 7.7 %{Hb} — ABNORMAL HIGH (ref <5.7)
Mean Plasma Glucose: 174 mg/dL
eAG (mmol/L): 9.7 mmol/L

## 2023-03-30 ENCOUNTER — Ambulatory Visit: Payer: BC Managed Care – PPO | Admitting: Urology

## 2023-04-06 ENCOUNTER — Ambulatory Visit: Payer: BC Managed Care – PPO | Admitting: Urology

## 2023-04-11 DIAGNOSIS — R220 Localized swelling, mass and lump, head: Secondary | ICD-10-CM | POA: Diagnosis not present

## 2023-04-11 DIAGNOSIS — L0201 Cutaneous abscess of face: Secondary | ICD-10-CM | POA: Diagnosis not present

## 2023-04-16 DIAGNOSIS — J479 Bronchiectasis, uncomplicated: Secondary | ICD-10-CM | POA: Diagnosis not present

## 2023-04-16 DIAGNOSIS — G4733 Obstructive sleep apnea (adult) (pediatric): Secondary | ICD-10-CM | POA: Diagnosis not present

## 2023-04-27 ENCOUNTER — Telehealth: Payer: Self-pay

## 2023-04-27 NOTE — Telephone Encounter (Signed)
Patient has only seen you.  No show for 10/14 appt. with Dr. Ronne Binning.  Scheduled for 11/19 with Dr. Retta Diones.  Requesting refill of Trimix. OK to send?

## 2023-04-27 NOTE — Telephone Encounter (Signed)
Patient needing to reorder his testosterone-ed shot.  Asking for a MyChart message Of the name and address to location to pick up shot.    Please advise.

## 2023-05-11 NOTE — Progress Notes (Signed)
History of Present Illness: Here to follow-up initial visit which was about 3 months ago.  At that time he was having BPH/LUTS, low energy level, weight gain, worsening ED.  He was increased from 1-2 tamsulosin a day.  Testosterone level was 275 (normal range 264).  Free testosterone not performed.  What I can see blood was drawn right before 10 AM.  PSA was 0.9.  Symptoms are not any better being on double dose of the tamsulosin.  He is using sildenafil for ED which does not help significantly.  IPSS 22/4  Past Medical History:  Diagnosis Date   Blood transfusion without reported diagnosis    many years ago   Diabetes mellitus without complication (HCC)    Dyspnea    exerting self,ie: running   GERD (gastroesophageal reflux disease)    ? medicine related   Hyperlipidemia    Hypertension    Pneumonia    Pre-diabetes    Sarcoidosis    Sleep apnea    wears cpap    Past Surgical History:  Procedure Laterality Date   BRONCHIAL WASHINGS  04/26/2021   Procedure: BRONCHIAL WASHINGS;  Surgeon: Martina Sinner, MD;  Location: [facility];  Service: Pulmonary;;   KNEE ARTHROSCOPY WITH MEDIAL MENISECTOMY Right 06/18/2018   Procedure: Right knee arthroscopy, partial medial menisectomy, debridement and drainage of cyst;  Surgeon: Jene Every, MD;  Location: [facility] OR;  Service: Orthopedics;  Laterality: Right;  60 mins   LOBECTOMY Left 2002   lower left  ( cyst on the lobe)in [facility] in [place], [place]   VIDEO BRONCHOSCOPY N/A 04/26/2021   Procedure: FLEX BRONCHOSCOPY WITHOUT FLUORO;  Surgeon: Martina Sinner, MD;  Location: [facility];  Service: Pulmonary;  Laterality: N/A;    Home Medications:  Allergies as of 05/12/2023       Reactions   Naproxen Hives   Penicillin G         Medication List        Accurate as of May 11, 2023  6:46 PM. If you have any questions, ask your nurse or doctor.          albuterol (2.5 MG/3ML) 0.083% nebulizer  solution Commonly known as: PROVENTIL Take 3 mLs (2.5 mg total) by nebulization 3 (three) times daily as needed for wheezing or shortness of breath.   albuterol 108 (90 Base) MCG/ACT inhaler Commonly known as: VENTOLIN HFA INHALE 2 PUFFS INTO THE LUNGS EVERY 4 HOURS AS NEEDED FOR WHEEZING OR SHORTNESS OF BREATH.   AMBULATORY NON FORMULARY MEDICATION Medication Name: TRIMIX PGE 30 CMG PAP 30 MG PHENT 1 MG  0.2 MLS BY INTRACAVERNOSAL ROUTE  AS NEEDED   amLODipine 5 MG tablet Commonly known as: NORVASC TAKE 1 TABLET (5 MG TOTAL) BY MOUTH DAILY.   atorvastatin 10 MG tablet Commonly known as: LIPITOR TAKE 1 TABLET BY MOUTH EVERY DAY   CORICIDIN D PO Take by mouth.   gabapentin 300 MG capsule Commonly known as: NEURONTIN Take 1 capsule (300 mg total) by mouth at bedtime.   losartan 100 MG tablet Commonly known as: COZAAR TAKE 1 TABLET BY MOUTH EVERY DAY   MENS ONE DAILY PO Take 1 tablet by mouth daily.   metFORMIN 500 MG tablet Commonly known as: GLUCOPHAGE Take 1 tablet (500 mg total) by mouth 2 (two) times daily with a meal.   sildenafil 20 MG tablet Commonly known as: REVATIO Take 2 - 5 tablets (40 mg - 100 mg) as one single dose  at least 60 minutes prior to anticipated sexual activity to achieve erection. Do not use more than once per day.   sodium chloride HYPERTONIC 3 % nebulizer solution Take by nebulization 3 (three) times daily. Use flutter valve after each nebulizer use   tamsulosin 0.4 MG Caps capsule Commonly known as: FLOMAX Take 1 capsule (0.4 mg total) by mouth in the morning and at bedtime.        Allergies:  Allergies  Allergen Reactions   Naproxen Hives   Penicillin G     Family History  Problem Relation Age of Onset   COPD Mother    Alcohol abuse Father    Arthritis Father    Diabetes Father    Heart disease Father    Hyperlipidemia Father    Kidney disease Father    Diabetes Brother    Stroke Brother    Alcohol abuse Brother     Diabetes Brother    Drug abuse Brother    Stomach cancer Paternal Uncle    Colon cancer Neg Hx    Colon polyps Neg Hx    Esophageal cancer Neg Hx    Rectal cancer Neg Hx     Social History:  reports that he has quit smoking. His smoking use included cigars. He has never used smokeless tobacco. He reports current alcohol use. He reports that he does not use drugs.  ROS: A complete review of systems was performed.  All systems are negative except for pertinent findings as noted.  Physical Exam:  Vital signs in last 24 hours: There were no vitals taken for this visit. Constitutional:  Alert and oriented, No acute distress.  He is morbidly obese. Cardiovascular: Regular rate  Respiratory: Normal respiratory effort GI: Abdomen is s obese.  No inguinal hernias palpable. Genitourinary: Normal male phallus, testes are descended bilaterally and non-tender and without masses, scrotum is normal in appearance without lesions or masses, perineum is normal on inspection.  Normal anal sphincter tone.  No rectal masses.  Prostate 70 g, symmetric, nonnodular, nontender. Lymphatic: No lymphadenopathy Neurologic: Grossly intact, no focal deficits Psychiatric: Normal mood and affect  I have reviewed prior pt notes  I have reviewed urinalysis results  I have independently reviewed prior imaging--CT scan results  I have reviewed prior PSA, testosterone, viral serology results  I have reviewed prior urine culture   Impression/Assessment:  1.  BPH with symptoms, still significant despite double dose tamsulosin.  Benign but large gland  2.  ED, organic.  Due to patient's age, lifestyle, obesity.  Not adequately treated with sildenafil  3.  Borderline low testosterone.  He is morbidly obese  Plan:  1.  Long discussion held with the patient and his fiance about proper lifestyle, losing weight, increasing cardio activity  2.  At this point, I do not feel likely due to begin testosterone  repletion.  I will have him drop by the next week or so for morning free/total testosterone.  Send results by way of MyChart  3.  Back down to tamsulosin once a day.  I have added Cialis 5 mg daily  4.  Office visit for recheck in about 2 months.

## 2023-05-12 ENCOUNTER — Ambulatory Visit (INDEPENDENT_AMBULATORY_CARE_PROVIDER_SITE_OTHER): Payer: BC Managed Care – PPO | Admitting: Urology

## 2023-05-12 ENCOUNTER — Other Ambulatory Visit: Payer: Self-pay | Admitting: Medical

## 2023-05-12 VITALS — BP 124/75 | HR 75 | Ht 74.0 in | Wt 306.4 lb

## 2023-05-12 DIAGNOSIS — N529 Male erectile dysfunction, unspecified: Secondary | ICD-10-CM | POA: Diagnosis not present

## 2023-05-12 DIAGNOSIS — Z125 Encounter for screening for malignant neoplasm of prostate: Secondary | ICD-10-CM

## 2023-05-12 DIAGNOSIS — N398 Other specified disorders of urinary system: Secondary | ICD-10-CM

## 2023-05-12 DIAGNOSIS — R339 Retention of urine, unspecified: Secondary | ICD-10-CM

## 2023-05-12 DIAGNOSIS — R7989 Other specified abnormal findings of blood chemistry: Secondary | ICD-10-CM

## 2023-05-12 DIAGNOSIS — N401 Enlarged prostate with lower urinary tract symptoms: Secondary | ICD-10-CM | POA: Diagnosis not present

## 2023-05-12 MED ORDER — TADALAFIL 5 MG PO TABS
5.0000 mg | ORAL_TABLET | Freq: Every day | ORAL | 3 refills | Status: AC | PRN
Start: 2023-05-12 — End: ?

## 2023-05-13 MED ORDER — GABAPENTIN 300 MG PO CAPS: 300.0000 mg | ORAL_CAPSULE | Freq: Every day | ORAL | 5 refills | Status: DC

## 2023-05-13 NOTE — Telephone Encounter (Signed)
Rx refill gabapentin sent to pharmacy.

## 2023-05-17 DIAGNOSIS — J479 Bronchiectasis, uncomplicated: Secondary | ICD-10-CM | POA: Diagnosis not present

## 2023-05-17 DIAGNOSIS — G4733 Obstructive sleep apnea (adult) (pediatric): Secondary | ICD-10-CM | POA: Diagnosis not present

## 2023-05-20 ENCOUNTER — Other Ambulatory Visit: Payer: BC Managed Care – PPO

## 2023-05-20 DIAGNOSIS — N529 Male erectile dysfunction, unspecified: Secondary | ICD-10-CM | POA: Diagnosis not present

## 2023-05-23 LAB — TESTOSTERONE,FREE AND TOTAL
Testosterone, Free: 4.2 pg/mL — ABNORMAL LOW (ref 7.2–24.0)
Testosterone: 347 ng/dL (ref 264–916)

## 2023-05-29 DIAGNOSIS — J189 Pneumonia, unspecified organism: Secondary | ICD-10-CM | POA: Diagnosis not present

## 2023-05-29 DIAGNOSIS — R059 Cough, unspecified: Secondary | ICD-10-CM | POA: Diagnosis not present

## 2023-05-29 DIAGNOSIS — R070 Pain in throat: Secondary | ICD-10-CM | POA: Diagnosis not present

## 2023-06-02 ENCOUNTER — Other Ambulatory Visit: Payer: Self-pay | Admitting: Medical

## 2023-06-10 ENCOUNTER — Other Ambulatory Visit: Payer: Self-pay | Admitting: Medical

## 2023-06-10 DIAGNOSIS — J4520 Mild intermittent asthma, uncomplicated: Secondary | ICD-10-CM

## 2023-06-16 DIAGNOSIS — G4733 Obstructive sleep apnea (adult) (pediatric): Secondary | ICD-10-CM | POA: Diagnosis not present

## 2023-06-16 DIAGNOSIS — J479 Bronchiectasis, uncomplicated: Secondary | ICD-10-CM | POA: Diagnosis not present

## 2023-07-08 LAB — HM DIABETES EYE EXAM

## 2023-07-12 DIAGNOSIS — J189 Pneumonia, unspecified organism: Secondary | ICD-10-CM | POA: Diagnosis not present

## 2023-07-12 DIAGNOSIS — R059 Cough, unspecified: Secondary | ICD-10-CM | POA: Diagnosis not present

## 2023-07-12 DIAGNOSIS — J101 Influenza due to other identified influenza virus with other respiratory manifestations: Secondary | ICD-10-CM | POA: Diagnosis not present

## 2023-07-12 DIAGNOSIS — R509 Fever, unspecified: Secondary | ICD-10-CM | POA: Diagnosis not present

## 2023-07-15 ENCOUNTER — Encounter: Payer: Self-pay | Admitting: Medical

## 2023-07-15 ENCOUNTER — Ambulatory Visit: Payer: BC Managed Care – PPO | Admitting: Medical

## 2023-07-15 ENCOUNTER — Ambulatory Visit (HOSPITAL_BASED_OUTPATIENT_CLINIC_OR_DEPARTMENT_OTHER)
Admission: RE | Admit: 2023-07-15 | Discharge: 2023-07-15 | Disposition: A | Discharge status: Home / Self Care | Payer: BC Managed Care – PPO | Source: Ambulatory Visit | Attending: Medical | Admitting: Medical

## 2023-07-15 VITALS — BP 110/70 | HR 80 | Temp 98.1°F | Resp 20 | Ht 74.0 in | Wt 299.0 lb

## 2023-07-15 DIAGNOSIS — J470 Bronchiectasis with acute lower respiratory infection: Secondary | ICD-10-CM | POA: Diagnosis not present

## 2023-07-15 DIAGNOSIS — J1100 Influenza due to unidentified influenza virus with unspecified type of pneumonia: Secondary | ICD-10-CM

## 2023-07-15 DIAGNOSIS — Z813 Family history of other psychoactive substance abuse and dependence: Secondary | ICD-10-CM | POA: Diagnosis not present

## 2023-07-15 DIAGNOSIS — E1165 Type 2 diabetes mellitus with hyperglycemia: Secondary | ICD-10-CM | POA: Diagnosis not present

## 2023-07-15 DIAGNOSIS — E785 Hyperlipidemia, unspecified: Secondary | ICD-10-CM | POA: Diagnosis not present

## 2023-07-15 DIAGNOSIS — J1000 Influenza due to other identified influenza virus with unspecified type of pneumonia: Secondary | ICD-10-CM | POA: Diagnosis not present

## 2023-07-15 DIAGNOSIS — Z833 Family history of diabetes mellitus: Secondary | ICD-10-CM | POA: Diagnosis not present

## 2023-07-15 DIAGNOSIS — Z825 Family history of asthma and other chronic lower respiratory diseases: Secondary | ICD-10-CM | POA: Diagnosis not present

## 2023-07-15 DIAGNOSIS — Z8249 Family history of ischemic heart disease and other diseases of the circulatory system: Secondary | ICD-10-CM | POA: Diagnosis not present

## 2023-07-15 DIAGNOSIS — R059 Cough, unspecified: Secondary | ICD-10-CM

## 2023-07-15 DIAGNOSIS — D869 Sarcoidosis, unspecified: Secondary | ICD-10-CM | POA: Diagnosis not present

## 2023-07-15 DIAGNOSIS — E118 Type 2 diabetes mellitus with unspecified complications: Secondary | ICD-10-CM

## 2023-07-15 DIAGNOSIS — I10 Essential (primary) hypertension: Secondary | ICD-10-CM | POA: Diagnosis not present

## 2023-07-15 DIAGNOSIS — Z823 Family history of stroke: Secondary | ICD-10-CM | POA: Diagnosis not present

## 2023-07-15 DIAGNOSIS — J9601 Acute respiratory failure with hypoxia: Secondary | ICD-10-CM | POA: Diagnosis not present

## 2023-07-15 DIAGNOSIS — E66812 Obesity, class 2: Secondary | ICD-10-CM | POA: Diagnosis not present

## 2023-07-15 DIAGNOSIS — Z87891 Personal history of nicotine dependence: Secondary | ICD-10-CM | POA: Diagnosis not present

## 2023-07-15 DIAGNOSIS — Z800 Family history of malignant neoplasm of digestive organs: Secondary | ICD-10-CM | POA: Diagnosis not present

## 2023-07-15 DIAGNOSIS — Z7951 Long term (current) use of inhaled steroids: Secondary | ICD-10-CM | POA: Diagnosis not present

## 2023-07-15 DIAGNOSIS — Z811 Family history of alcohol abuse and dependence: Secondary | ICD-10-CM | POA: Diagnosis not present

## 2023-07-15 DIAGNOSIS — J984 Other disorders of lung: Secondary | ICD-10-CM | POA: Diagnosis not present

## 2023-07-15 DIAGNOSIS — Z7722 Contact with and (suspected) exposure to environmental tobacco smoke (acute) (chronic): Secondary | ICD-10-CM | POA: Diagnosis present

## 2023-07-15 DIAGNOSIS — J479 Bronchiectasis, uncomplicated: Secondary | ICD-10-CM | POA: Diagnosis not present

## 2023-07-15 DIAGNOSIS — Z6838 Body mass index (BMI) 38.0-38.9, adult: Secondary | ICD-10-CM | POA: Diagnosis not present

## 2023-07-15 DIAGNOSIS — Z8261 Family history of arthritis: Secondary | ICD-10-CM | POA: Diagnosis not present

## 2023-07-15 DIAGNOSIS — J111 Influenza due to unidentified influenza virus with other respiratory manifestations: Secondary | ICD-10-CM | POA: Diagnosis not present

## 2023-07-15 DIAGNOSIS — N400 Enlarged prostate without lower urinary tract symptoms: Secondary | ICD-10-CM | POA: Diagnosis not present

## 2023-07-15 DIAGNOSIS — Z886 Allergy status to analgesic agent status: Secondary | ICD-10-CM | POA: Diagnosis not present

## 2023-07-15 DIAGNOSIS — G4733 Obstructive sleep apnea (adult) (pediatric): Secondary | ICD-10-CM

## 2023-07-15 DIAGNOSIS — D860 Sarcoidosis of lung: Secondary | ICD-10-CM | POA: Diagnosis not present

## 2023-07-15 DIAGNOSIS — R058 Other specified cough: Secondary | ICD-10-CM | POA: Diagnosis not present

## 2023-07-15 DIAGNOSIS — Z7984 Long term (current) use of oral hypoglycemic drugs: Secondary | ICD-10-CM | POA: Diagnosis not present

## 2023-07-15 LAB — COMPREHENSIVE METABOLIC PANEL
AG Ratio: 1.5 (calc) (ref 1.0–2.5)
ALT: 36 U/L (ref 9–46)
AST: 30 U/L (ref 10–35)
Albumin: 4.1 g/dL (ref 3.6–5.1)
Alkaline phosphatase (APISO): 62 U/L (ref 35–144)
BUN: 15 mg/dL (ref 7–25)
CO2: 27 mmol/L (ref 20–32)
Calcium: 9.1 mg/dL (ref 8.6–10.3)
Chloride: 101 mmol/L (ref 98–110)
Creat: 0.78 mg/dL (ref 0.70–1.30)
Globulin: 2.7 g/dL (ref 1.9–3.7)
Glucose, Bld: 117 mg/dL — ABNORMAL HIGH (ref 65–99)
Potassium: 4.3 mmol/L (ref 3.5–5.3)
Sodium: 137 mmol/L (ref 135–146)
Total Bilirubin: 0.4 mg/dL (ref 0.2–1.2)
Total Protein: 6.8 g/dL (ref 6.1–8.1)

## 2023-07-15 LAB — CBC WITH DIFFERENTIAL/PLATELET
Absolute Lymphocytes: 1351 {cells}/uL (ref 850–3900)
Absolute Monocytes: 476 {cells}/uL (ref 200–950)
Basophils Absolute: 11 {cells}/uL (ref 0–200)
Basophils Relative: 0.3 %
Eosinophils Absolute: 158 {cells}/uL (ref 15–500)
Eosinophils Relative: 4.5 %
HCT: 39.1 % (ref 38.5–50.0)
Hemoglobin: 13.1 g/dL — ABNORMAL LOW (ref 13.2–17.1)
MCH: 28.4 pg (ref 27.0–33.0)
MCHC: 33.5 g/dL (ref 32.0–36.0)
MCV: 84.8 fL (ref 80.0–100.0)
MPV: 10 fL (ref 7.5–12.5)
Monocytes Relative: 13.6 %
Neutro Abs: 1505 {cells}/uL (ref 1500–7800)
Neutrophils Relative %: 43 %
Platelets: 238 10*3/uL (ref 140–400)
RBC: 4.61 10*6/uL (ref 4.20–5.80)
RDW: 13.3 % (ref 11.0–15.0)
Total Lymphocyte: 38.6 %
WBC: 3.5 10*3/uL — ABNORMAL LOW (ref 3.8–10.8)

## 2023-07-15 LAB — HEMOGLOBIN A1C
Hgb A1c MFr Bld: 7.3 %{Hb} — ABNORMAL HIGH (ref <5.7)
Mean Plasma Glucose: 163 mg/dL
eAG (mmol/L): 9 mmol/L

## 2023-07-15 MED ORDER — CEFTRIAXONE SODIUM 1 G IJ SOLR
1.0000 g | Freq: Once | INTRAMUSCULAR | Status: AC
  Administered 2023-07-15: 1 g via INTRAMUSCULAR

## 2023-07-15 MED ORDER — HYDROCODONE BIT-HOMATROP MBR 5-1.5 MG/5ML PO SOLN: 5.0000 mL | Freq: Three times a day (TID) | ORAL | 0 refills | Status: DC | PRN

## 2023-07-15 MED ORDER — BUDESONIDE-FORMOTEROL FUMARATE 160-4.5 MCG/ACT IN AERO: 2.0000 | INHALATION_SPRAY | Freq: Two times a day (BID) | RESPIRATORY_TRACT | 3 refills | Status: DC

## 2023-07-15 MED ORDER — LEVOFLOXACIN 750 MG PO TABS: 750.0000 mg | ORAL_TABLET | Freq: Every day | ORAL | 0 refills | Status: DC

## 2023-07-15 NOTE — Patient Instructions (Addendum)
Influenza and possible pneumonia Recent diagnosis at an urgent care center. Despite treatment with Levofloxacin 750mg  daily for 5 days and Tamiflu 75mg  twice daily for 5 days, symptoms persist including productive cough, fever, and malaise. History of bronchiectasis and sarcoidosis. -Administer Rocephin 1g injection today. -Continue Tamiflu until completion. -Order CBC, lactic acid, metabolic panel, and chest x-ray to assess severity and response to treatment. -Consider emergency department referral if labs indicate or symptoms worsen. -Prescribe Hydrocodone-based cough syrup to aid sleep. -asked pt to get 02 sat monitor and check pulse ox daily. -want to see improved/greather than current 94% -if decreasing less let me know. -if any levels 90% or less be seen in ED.  Sleep Apnea DOT requires updated sleep study. -Refer to pulmonologist for sleep study.  Diabetes DOT requires updated A1c. -Order A1c.  Follow-up To be determined based on lab results.

## 2023-07-15 NOTE — Progress Notes (Signed)
   Subjective:    Patient ID: Jackson Hatfield, male    DOB: 03/31/1971, 52 y.o.   MRN: [id number]  HPI  Discussed the use of AI scribe software for clinical note transcription with the patient, who gave verbal consent to proceed.  History of Present Illness   The patient, with a history of bronchiectasis and sarcoidosis, presented with a recent diagnosis of influenza and possible pneumonia. He reported feeling unwell since the previous week, with symptoms including a productive cough with gray sputum, fever, chills, and sweats. The patient described his head as feeling heavy and reported difficulty breathing, with his lung sounding like a "popcorn machine" at night. Despite being on a five-day course of levofloxacin and Tamiflu, the patient reported minimal improvement in his condition.  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 He expressed frustration with his current health status, noting that his lung condition was significantly impacting his quality of life, to the point of considering short-term disability. The patient also reported difficulty sleeping due to his symptoms.  The patient was seen at an urgent care center in [place], [place], where he was diagnosed with influenza and possible pneumonia. At the time of the visit, his temperature was 103.73F. The patient was on the last day of his 5 day  levofloxacin course at the time of the consultation.        Review of Systems See hpi    Objective:   Physical Exam  General Mental Status- Alert. General Appearance- Not in acute distress.   Skin General: Color- Normal Color. Moisture- Normal Moisture.  Neck No JVD.  Chest and Lung Exam Auscultation: Breath Sounds:- Even unlabored but rt lower lung field moderate rough breath sounds.  Cardiovascular Auscultation:Rythm- RRR Murmurs & Other Heart Sounds:Auscultation of the heart reveals- No  Murmurs.   Neurologic Cranial Nerve exam:- CN III-XII intact(No nystagmus), symmetric smile. Strength:- 5/5 equal and symmetric strength both upper and lower extremities.   Lower ext- calfs symmetric, negative homans signs, no pedal edema.    Assessment & Plan:  Assessment and Plan    Influenza and possible pneumonia Recent diagnosis at an urgent care center. Despite treatment with Levofloxacin 750mg  daily for 5 days and Tamiflu 75mg  twice daily for 5 days, symptoms persist including productive cough, fever, and malaise. History of bronchiectasis and sarcoidosis. -Administer Rocephin 1g injection today. -Continue Tamiflu until completion. -Order CBC, lactic acid, metabolic panel, and chest x-ray to assess severity and response to treatment. -Consider emergency department referral if labs indicate or symptoms worsen. -Prescribe Hydrocodone-based cough syrup to aid sleep. -after hours review cxr. Gave additional 5 days of levofloxin. Plan to repeat rocpehin 1 gram IM on friday as nurse vist -added symbicort inhaler  Sleep Apnea DOT requires updated sleep study. -Refer to pulmonologist for sleep study.  Diabetes DOT requires updated A1c. -Order A1c.  Follow-up To be determined based on lab results.       Time spent with patient today was 43  minutes which consisted of chart review, discussing diagnosis, work up, treatment and documentation.

## 2023-07-16 ENCOUNTER — Inpatient Hospital Stay (HOSPITAL_BASED_OUTPATIENT_CLINIC_OR_DEPARTMENT_OTHER)
Admission: EM | Admit: 2023-07-16 | Discharge: 2023-07-19 | DRG: 193 | Disposition: A | Discharge status: Home / Self Care | Payer: BC Managed Care – PPO | Attending: Internal Medicine | Admitting: Internal Medicine

## 2023-07-16 ENCOUNTER — Other Ambulatory Visit: Payer: Self-pay

## 2023-07-16 ENCOUNTER — Emergency Department (HOSPITAL_BASED_OUTPATIENT_CLINIC_OR_DEPARTMENT_OTHER): Payer: BC Managed Care – PPO

## 2023-07-16 ENCOUNTER — Encounter (HOSPITAL_BASED_OUTPATIENT_CLINIC_OR_DEPARTMENT_OTHER): Payer: Self-pay | Admitting: Emergency Medicine

## 2023-07-16 DIAGNOSIS — Z886 Allergy status to analgesic agent status: Secondary | ICD-10-CM | POA: Diagnosis not present

## 2023-07-16 DIAGNOSIS — J479 Bronchiectasis, uncomplicated: Secondary | ICD-10-CM

## 2023-07-16 DIAGNOSIS — Z8249 Family history of ischemic heart disease and other diseases of the circulatory system: Secondary | ICD-10-CM

## 2023-07-16 DIAGNOSIS — Z880 Allergy status to penicillin: Secondary | ICD-10-CM

## 2023-07-16 DIAGNOSIS — J1000 Influenza due to other identified influenza virus with unspecified type of pneumonia: Secondary | ICD-10-CM | POA: Diagnosis not present

## 2023-07-16 DIAGNOSIS — Z6838 Body mass index (BMI) 38.0-38.9, adult: Secondary | ICD-10-CM

## 2023-07-16 DIAGNOSIS — Z888 Allergy status to other drugs, medicaments and biological substances status: Secondary | ICD-10-CM

## 2023-07-16 DIAGNOSIS — J111 Influenza due to unidentified influenza virus with other respiratory manifestations: Secondary | ICD-10-CM | POA: Diagnosis not present

## 2023-07-16 DIAGNOSIS — K219 Gastro-esophageal reflux disease without esophagitis: Secondary | ICD-10-CM | POA: Diagnosis present

## 2023-07-16 DIAGNOSIS — E66812 Obesity, class 2: Secondary | ICD-10-CM | POA: Diagnosis not present

## 2023-07-16 DIAGNOSIS — D860 Sarcoidosis of lung: Secondary | ICD-10-CM | POA: Diagnosis present

## 2023-07-16 DIAGNOSIS — J9601 Acute respiratory failure with hypoxia: Secondary | ICD-10-CM | POA: Diagnosis present

## 2023-07-16 DIAGNOSIS — Z83438 Family history of other disorder of lipoprotein metabolism and other lipidemia: Secondary | ICD-10-CM

## 2023-07-16 DIAGNOSIS — Z7722 Contact with and (suspected) exposure to environmental tobacco smoke (acute) (chronic): Secondary | ICD-10-CM | POA: Diagnosis not present

## 2023-07-16 DIAGNOSIS — Z87891 Personal history of nicotine dependence: Secondary | ICD-10-CM

## 2023-07-16 DIAGNOSIS — E785 Hyperlipidemia, unspecified: Secondary | ICD-10-CM | POA: Diagnosis present

## 2023-07-16 DIAGNOSIS — D869 Sarcoidosis, unspecified: Secondary | ICD-10-CM | POA: Diagnosis not present

## 2023-07-16 DIAGNOSIS — N400 Enlarged prostate without lower urinary tract symptoms: Secondary | ICD-10-CM | POA: Diagnosis present

## 2023-07-16 DIAGNOSIS — Z833 Family history of diabetes mellitus: Secondary | ICD-10-CM

## 2023-07-16 DIAGNOSIS — G4733 Obstructive sleep apnea (adult) (pediatric): Secondary | ICD-10-CM | POA: Diagnosis not present

## 2023-07-16 DIAGNOSIS — Z800 Family history of malignant neoplasm of digestive organs: Secondary | ICD-10-CM

## 2023-07-16 DIAGNOSIS — Z7984 Long term (current) use of oral hypoglycemic drugs: Secondary | ICD-10-CM

## 2023-07-16 DIAGNOSIS — R058 Other specified cough: Secondary | ICD-10-CM | POA: Diagnosis not present

## 2023-07-16 DIAGNOSIS — Z813 Family history of other psychoactive substance abuse and dependence: Secondary | ICD-10-CM

## 2023-07-16 DIAGNOSIS — I10 Essential (primary) hypertension: Secondary | ICD-10-CM | POA: Diagnosis present

## 2023-07-16 DIAGNOSIS — J470 Bronchiectasis with acute lower respiratory infection: Secondary | ICD-10-CM | POA: Diagnosis present

## 2023-07-16 DIAGNOSIS — Z811 Family history of alcohol abuse and dependence: Secondary | ICD-10-CM

## 2023-07-16 DIAGNOSIS — E1165 Type 2 diabetes mellitus with hyperglycemia: Secondary | ICD-10-CM | POA: Diagnosis not present

## 2023-07-16 DIAGNOSIS — Z825 Family history of asthma and other chronic lower respiratory diseases: Secondary | ICD-10-CM

## 2023-07-16 DIAGNOSIS — Z823 Family history of stroke: Secondary | ICD-10-CM | POA: Diagnosis not present

## 2023-07-16 DIAGNOSIS — Z79899 Other long term (current) drug therapy: Secondary | ICD-10-CM

## 2023-07-16 DIAGNOSIS — Z841 Family history of disorders of kidney and ureter: Secondary | ICD-10-CM

## 2023-07-16 DIAGNOSIS — Z7951 Long term (current) use of inhaled steroids: Secondary | ICD-10-CM | POA: Diagnosis not present

## 2023-07-16 DIAGNOSIS — Z8261 Family history of arthritis: Secondary | ICD-10-CM | POA: Diagnosis not present

## 2023-07-16 LAB — BASIC METABOLIC PANEL
Anion gap: 8 (ref 5–15)
BUN: 12 mg/dL (ref 6–20)
CO2: 26 mmol/L (ref 22–32)
Calcium: 9 mg/dL (ref 8.9–10.3)
Chloride: 102 mmol/L (ref 98–111)
Creatinine, Ser: 0.71 mg/dL (ref 0.61–1.24)
GFR, Estimated: >60 mL/min (ref >60)
Glucose, Bld: 134 mg/dL — ABNORMAL HIGH (ref 70–99)
Potassium: 4.1 mmol/L (ref 3.5–5.1)
Sodium: 136 mmol/L (ref 135–145)

## 2023-07-16 LAB — CBC WITH DIFFERENTIAL/PLATELET
Abs Immature Granulocytes: 0.01 10*3/uL (ref 0.00–0.07)
Basophils Absolute: 0 10*3/uL (ref 0.0–0.1)
Basophils Relative: 0 %
Eosinophils Absolute: 0.2 10*3/uL (ref 0.0–0.5)
Eosinophils Relative: 4 %
HCT: 38.4 % — ABNORMAL LOW (ref 39.0–52.0)
Hemoglobin: 13.1 g/dL (ref 13.0–17.0)
Immature Granulocytes: 0 %
Lymphocytes Relative: 27 %
Lymphs Abs: 1.1 10*3/uL (ref 0.7–4.0)
MCH: 28.5 pg (ref 26.0–34.0)
MCHC: 34.1 g/dL (ref 30.0–36.0)
MCV: 83.7 fL (ref 80.0–100.0)
Monocytes Absolute: 0.5 10*3/uL (ref 0.1–1.0)
Monocytes Relative: 11 %
Neutro Abs: 2.4 10*3/uL (ref 1.7–7.7)
Neutrophils Relative %: 58 %
Platelets: 229 10*3/uL (ref 150–400)
RBC: 4.59 MIL/uL (ref 4.22–5.81)
RDW: 13.2 % (ref 11.5–15.5)
WBC: 4.1 10*3/uL (ref 4.0–10.5)
nRBC: 0 % (ref 0.0–0.2)

## 2023-07-16 LAB — LACTIC ACID, PLASMA: LACTIC ACID: 1 mmol/L (ref 0.4–1.8)

## 2023-07-16 LAB — BRAIN NATRIURETIC PEPTIDE: B Natriuretic Peptide: 52.1 pg/mL (ref 0.0–100.0)

## 2023-07-16 MED ORDER — IOHEXOL 300 MG/ML  SOLN
100.0000 mL | Freq: Once | INTRAMUSCULAR | Status: AC | PRN
  Administered 2023-07-16: 100 mL via INTRAVENOUS

## 2023-07-16 NOTE — ED Provider Notes (Signed)
[place] EMERGENCY DEPARTMENT AT [facility] Provider Note   CSN: [id number] Arrival date & time: 07/16/23  1010     History Chief Complaint  Patient presents with   Shortness of Breath    Jackson Hatfield is a 53 y.o. male with history of sarcoidosis, bronchiectasis, diabetes who presents to the emergency department with shortness of breath and copious productive cough this been present for several days.  Patient was recently diagnosed with influenza with superimposed pneumonia and placed on Levaquin.  He has been on this while continuing to decline.  He was seen by his primary care physician yesterday had a chest x-ray and some labs done.  I personally reviewed these there was no evidence of leukocytosis but there was some leukopenia.  Chest x-ray did not reveal any significant gross abnormalities.  However, patient's O2 saturations were in the low 90s and he was prompted to come to the emergency room by his PCP.  He denies any fever, chills, abdominal pain, chest pain, nausea, vomiting, diarrhea.   Shortness of Breath      Home Medications Prior to Admission medications   Medication Sig Start Date End Date Taking? Authorizing Provider  albuterol (PROVENTIL) (2.5 MG/3ML) 0.083% nebulizer solution Take 3 mLs (2.5 mg total) by nebulization 3 (three) times daily as needed for wheezing or shortness of breath. 04/11/21  Yes Martina Sinner, MD  albuterol (VENTOLIN HFA) 108 (90 Base) MCG/ACT inhaler Inhale 2 puffs into the lungs every 4 (four) hours as needed for wheezing or shortness of breath. 06/11/23  Yes [name], Ramon Dredge, PA-C  amLODipine (NORVASC) 5 MG tablet Take 1 tablet (5 mg total) by mouth daily. 06/11/23  Yes [name], Ramon Dredge, PA-C  atorvastatin (LIPITOR) 10 MG tablet TAKE 1 TABLET BY MOUTH EVERY DAY 11/28/22  Yes [name], Ramon Dredge, PA-C  levofloxacin (LEVAQUIN) 750 MG tablet Take 1 tablet (750 mg total) by mouth daily. 07/15/23  Yes [name], Ramon Dredge, PA-C  losartan  (COZAAR) 100 MG tablet Take 1 tablet (100 mg total) by mouth daily. 06/11/23  Yes [name], Ramon Dredge, PA-C  metFORMIN (GLUCOPHAGE) 500 MG tablet Take 1 tablet (500 mg total) by mouth 2 (two) times daily with a meal. 06/11/23  Yes [name], Ramon Dredge, PA-C  Multiple Vitamins-Minerals (MENS ONE DAILY PO) Take 1 tablet by mouth daily.    Yes [provider]  oseltamivir (TAMIFLU) 75 MG capsule Take 75 mg by mouth 2 (two) times daily. 07/12/23  Yes [provider]  tadalafil (CIALIS) 5 MG tablet Take 1 tablet (5 mg total) by mouth daily as needed for erectile dysfunction. 05/12/23  Yes Dahlstedt, Jeannett Senior, MD  tamsulosin (FLOMAX) 0.4 MG CAPS capsule Take 1 capsule (0.4 mg total) by mouth in the morning and at bedtime. 12/29/22  Yes Donnita Falls, FNP  budesonide-formoterol (SYMBICORT) 160-4.5 MCG/ACT inhaler Inhale 2 puffs into the lungs 2 (two) times daily. 07/15/23   [name], Ramon Dredge, PA-C  HYDROcodone bit-homatropine (HYCODAN) 5-1.5 MG/5ML syrup Take 5 mLs by mouth every 8 (eight) hours as needed for cough. 07/15/23   [name], Ramon Dredge, PA-C  sildenafil (REVATIO) 20 MG tablet Take 2 - 5 tablets (40 mg - 100 mg) as one single dose at least 60 minutes prior to anticipated sexual activity to achieve erection. Do not use more than once per day. Patient not taking: Reported on 07/16/2023 03/06/23   [name], Ramon Dredge, PA-C  sodium chloride HYPERTONIC 3 % nebulizer solution Take by nebulization 3 (three) times daily. Use flutter valve after each nebulizer use Patient  not taking: Reported on 07/16/2023 07/01/21   Martina Sinner, MD      Allergies    Naproxen and Penicillin g    Review of Systems   Review of Systems  Respiratory:  Positive for shortness of breath.   All other systems reviewed and are negative.   Physical Exam Updated Vital Signs BP 133/77 (BP Location: Right Arm)   Pulse 81   Temp 97.9 F (36.6 C)   Resp (!) 21   Ht 6\' 2"  (1.88 m)   Wt 135 kg   SpO2 91%   BMI 38.21 kg/m   Physical Exam Vitals and nursing note reviewed.  Constitutional:      General: He is not in acute distress.    Appearance: Normal appearance.  HENT:     Head: Normocephalic and atraumatic.  Eyes:     General:        Right eye: No discharge.        Left eye: No discharge.  Cardiovascular:     Comments: Regular rate and rhythm.  S1/S2 are distinct without any evidence of murmur, rubs, or gallops.  Radial pulses are 2+ bilaterally.  Dorsalis pedis pulses are 2+ bilaterally.  No evidence of pedal edema. Pulmonary:     Effort: Tachypnea present.     Breath sounds: Rhonchi and rales present.  Abdominal:     General: Abdomen is flat. Bowel sounds are normal. There is no distension.     Tenderness: There is no abdominal tenderness. There is no guarding or rebound.  Musculoskeletal:        General: Normal range of motion.     Cervical back: Neck supple.  Skin:    General: Skin is warm and dry.     Findings: No rash.  Neurological:     General: No focal deficit present.     Mental Status: He is alert.  Psychiatric:        Mood and Affect: Mood normal.        Behavior: Behavior normal.     ED Results / Procedures / Treatments   Labs (all labs ordered are listed, but only abnormal results are displayed) Labs Reviewed  CBC WITH DIFFERENTIAL/PLATELET - Abnormal; Notable for the following components:      Result Value   HCT 38.4 (*)    All other components within normal limits  BASIC METABOLIC PANEL - Abnormal; Notable for the following components:   Glucose, Bld 134 (*)    All other components within normal limits  BRAIN NATRIURETIC PEPTIDE    EKG EKG Interpretation Date/Time:  Thursday July 16 2023 10:42:06 EST Ventricular Rate:  85 PR Interval:  195 QRS Duration:  98 QT Interval:  371 QTC Calculation: 442 R Axis:   111  Text Interpretation: Sinus rhythm Possible  Anterior infarct, old No significant change since last tracing Confirmed by Linwood Dibbles [phone]) on  07/16/2023 10:46:53 AM  Radiology CT Chest W Contrast Result Date: 07/16/2023 CLINICAL DATA:  53 year old male with a history of sarcoidosis. Productive cough. Hypoxia. Recently diagnosed with influenza. EXAM: CT CHEST WITH CONTRAST TECHNIQUE: Multidetector CT imaging of the chest was performed during intravenous contrast administration. RADIATION DOSE REDUCTION: This exam was performed according to the departmental dose-optimization program which includes automated exposure control, adjustment of the mA and/or kV according to patient size and/or use of iterative reconstruction technique. CONTRAST:  OMNIPAQUE IOHEXOL 300 MG/ML  SOLN COMPARISON:  Cardiac CT 03/17/2022. Chest radiographs yesterday. Chest CT 03/04/2022. FINDINGS:  Cardiovascular: Stable heart size since 2023. No pericardial effusion. Minimal calcified atherosclerosis of the thoracic aorta. Mediastinum/Nodes: Chronic increased number of generally subcentimeter mediastinal lymph nodes, some faintly calcified (series 2, image 74), stable since 2023. The largest node is near the left hilum, 1.5 cm short axis. Lungs/Pleura: Dependent retained secretions in the trachea on series 4, image 31. These continue into the low left mainstem bronchus. There is severe chronic left lower lobe bronchiectasis with associated more proximal chronic airway thickening. Associated architectural distortion in the left lingula and lower lobe, stable nodularity since 2023. Left apical lung scarring and architectural distortion on series 4, image 29 is also stable. Scattered additional bilateral pulmonary nodularity also has not significantly changed since 2023. Bronchiectasis in the medial segment of the right middle lobe is stable. Medial right lower lobe peribronchial patchy and nodular opacity is stable (series 4, image 116). No pleural effusion. No areas of worsening ventilation. Upper Abdomen: Evidence of chronic hepatic steatosis. Visible liver, gallbladder,  spleen, pancreas, adrenal glands, left kidney and bowel appears stable and negative. Low level upper abdominal lymphadenopathy (series 2, image 170) also appears stable. Musculoskeletal: No acute or suspicious osseous lesion. IMPRESSION: 1. Stable CT appearance of Thoracic Sarcoidosis since 2023. Associated chronic Bronchiectasis, architectural distortion maximal in the left lower lung. 2. Retained secretions in the trachea and left mainstem bronchus. But no evidence of acute pulmonary infection. No pleural effusion. Electronically Signed   By: Odessa Linn Clavin M.D.   On: 07/16/2023 12:15   DG Chest 2 View Result Date: 07/15/2023 CLINICAL DATA:  History of sarcoid productive cough EXAM: CHEST - 2 VIEW COMPARISON:  04/11/2022, CT chest 03/04/2022, chest x-ray 04/04/2021 FINDINGS: Similar appearance of the lingula and left base compared to prior radiograph, likely corresponds to bronchiectasis and scarring seen on prior chest CT. Chronic volume loss left thorax corresponding to history of left lower lobectomy. No definite acute superimposed airspace disease compared to prior. Stable cardiomediastinal silhouette. No pneumothorax IMPRESSION: Similar appearance of volume loss on the left with chronic scarring and bronchiectasis in the left lower lung. No definite acute superimposed airspace disease compared to prior radiograph. Electronically Signed   By: Jasmine Pang M.D.   On: 07/15/2023 16:25    Procedures Procedures    Medications Ordered in ED Medications  iohexol (OMNIPAQUE) 300 MG/ML solution 100 mL (100 mLs Intravenous Contrast Given 07/16/23 1148)    ED Course/ Medical Decision Making/ A&P Clinical Course as of 07/16/23 1317  Thu Jul 16, 2023  1140 Brain natriuretic peptide Negative.  [CF]  1140 CBC with Differential(!) Normal.  [CF]  1140 Basic metabolic panel(!) Normal.  [CF]  1236 I was notified by nursing staff that the patient's O2 saturations dropped to 84% while ambulating.  Patient would  likely need to be admitted for further evaluation.  [CF]  1253 I went over all of the lab results and imaging with the patient at the bedside.  Answered all questions and concerns.  Since the patient's O2 sats dropped to 84% I stressed the importance of likely admission.  Patient in agreement.  Will call the hospitalist. [CF]  1314 I spoke with Dr. Robb Matar with [facility] hospitalist who agrees to admit the patient. [CF]    Clinical Course User Index [CF] Teressa Lower, PA-C   {   Click here for ABCD2, HEART and other calculators  Medical Decision Making Jackson Hatfield is a 53 y.o. male patient who presents to the emergency department today for further evaluation of  shortness of breath and productive cough.  Given the patient's complex pulmonary history and recent chest x-ray will likely get a CT scan and with contrast to get a better picture.  Patient does have some pretty significant Rales and I will also add on a BNP.  Last echocardiogram back in 2024 does reveal slightly reduced left ventricular function.  Patient is tachypneic here for the rest of his vital signs are normal.  Will repeat some of the basic labs as well here.  Patient's O2 sats dropped into the low 80s while ambulating minimally.  Patient is not on oxygen at baseline.  Patient does meet inpatient criteria.  Will plan to get him admitted to the hospitalist for acute hypoxic respiratory failure.  Amount and/or Complexity of Data Reviewed Labs: ordered. Decision-making details documented in ED Course. Radiology: ordered.  Risk Prescription drug management. Decision regarding hospitalization.    Final Clinical Impression(s) / ED Diagnoses Final diagnoses:  Acute respiratory failure with hypoxia [facility])    Rx / DC Orders ED Discharge Orders     None         Jolyn Lent 07/16/23 1318    Linwood Dibbles, MD 07/16/23 [phone]

## 2023-07-16 NOTE — ED Triage Notes (Signed)
Pt endorse recent Flu dx, and states referral for MRI d/t hypoxia

## 2023-07-16 NOTE — ED Notes (Signed)
Report called over to [facility] and given to Ranita

## 2023-07-16 NOTE — ED Notes (Addendum)
Patients oxygen saturation dropped to 84% while ambulating.

## 2023-07-16 NOTE — Progress Notes (Signed)
Plan of Care Note for accepted transfer   Patient: Jackson Hatfield MRN: [id number]   DOA: 07/16/2023  Facility requesting transfer: [facility]. Requesting Provider: Linwood Dibbles, MD. Reason for transfer: Acute respiratory failure with hypoxia. Facility course:  53 year old male with a past medical history sarcoidosis, bronchiectasis, obstructive sleep apnea, class II obesity, hyperlipidemia, BPH, hypertension, type 2 diabetes who was recently diagnosed with influenza with progressively worsening dyspnea who was sent by his PCP for further workup to the emergency department.  While in the emergency department, he had hypoxia of 84% while ambulating briefly.  He is currently saturating in the low 90s on room air.  No meds yet given in the ED.  Lab work: Adult nurse [[id number]] (Abnormal)   Collected: 07/16/23 1054   Updated: 07/16/23 1134   Specimen Type: Blood   Specimen Source: Vein    Sodium 136 mmol/L   Potassium 4.1 mmol/L   Chloride 102 mmol/L   CO2 26 mmol/L   Glucose, Bld 134 High  mg/dL   BUN 12 mg/dL   Creatinine, Ser 0.86 mg/dL   Calcium 9.0 mg/dL   GFR, Estimated >57 mL/min   Anion gap 8  Brain natriuretic peptide [[id number]]   Collected: 07/16/23 1054   Updated: 07/16/23 1128   Specimen Type: Blood   Specimen Source: Vein    B Natriuretic Peptide 52.1 pg/mL  CBC with Differential [[id number]] (Abnormal)   Collected: 07/16/23 1054   Updated: 07/16/23 1100   Specimen Type: Blood   Specimen Source: Vein    WBC 4.1 K/uL   RBC 4.59 MIL/uL   Hemoglobin 13.1 g/dL   HCT 02.7 Low  %   MCV 83.7 fL   MCH 28.5 pg   MCHC 34.1 g/dL   RDW 25.3 %   Platelets 229 K/uL   nRBC 0.0 %   Neutrophils Relative % 58 %   Neutro Abs 2.4 K/uL   Lymphocytes Relative 27 %   Lymphs Abs 1.1 K/uL   Monocytes Relative 11 %   Monocytes Absolute 0.5 K/uL   Eosinophils Relative 4 %   Eosinophils Absolute 0.2 K/uL   Basophils Relative 0 %   Basophils Absolute 0.0 K/uL   Immature  Granulocytes 0 %   Abs Immature Granulocytes 0.01 K/uL   Imaging: CT chest with contrast.  FINDINGS: Cardiovascular: Stable heart size since 2023. No pericardial effusion. Minimal calcified atherosclerosis of the thoracic aorta.   Mediastinum/Nodes: Chronic increased number of generally subcentimeter mediastinal lymph nodes, some faintly calcified (series 2, image 74), stable since 2023. The largest node is near the left hilum, 1.5 cm short axis.   Lungs/Pleura: Dependent retained secretions in the trachea on series 4, image 31. These continue into the low left mainstem bronchus. There is severe chronic left lower lobe bronchiectasis with associated more proximal chronic airway thickening. Associated architectural distortion in the left lingula and lower lobe, stable nodularity since 2023.   Left apical lung scarring and architectural distortion on series 4, image 29 is also stable. Scattered additional bilateral pulmonary nodularity also has not significantly changed since 2023. Bronchiectasis in the medial segment of the right middle lobe is stable. Medial right lower lobe peribronchial patchy and nodular opacity is stable (series 4, image 116). No pleural effusion. No areas of worsening ventilation.   Upper Abdomen: Evidence of chronic hepatic steatosis. Visible liver, gallbladder, spleen, pancreas, adrenal glands, left kidney and bowel appears stable and negative. Low level upper abdominal lymphadenopathy (series 2, image 170) also appears stable.  Musculoskeletal: No acute or suspicious osseous lesion.   IMPRESSION: 1. Stable CT appearance of Thoracic Sarcoidosis since 2023. Associated chronic Bronchiectasis, architectural distortion maximal in the left lower lung.   2. Retained secretions in the trachea and left mainstem bronchus. But no evidence of acute pulmonary infection. No pleural effusion.   Electronically Signed   By: Odessa Fleming M.D.   On: 07/16/2023  12:15  Plan of care: The patient is accepted for admission to Telemetry unit, at [facility].  Author: Bobette Mo, MD 07/16/2023  Check [email or web address] for on-call coverage.  Nursing staff, Please call [facility] Admits & Consults System-Wide number on Amion as soon as patient's arrival, so appropriate admitting provider can evaluate the pt.

## 2023-07-17 ENCOUNTER — Telehealth: Payer: Self-pay | Admitting: Internal Medicine

## 2023-07-17 DIAGNOSIS — Z87891 Personal history of nicotine dependence: Secondary | ICD-10-CM | POA: Diagnosis not present

## 2023-07-17 DIAGNOSIS — N400 Enlarged prostate without lower urinary tract symptoms: Secondary | ICD-10-CM | POA: Diagnosis present

## 2023-07-17 DIAGNOSIS — D860 Sarcoidosis of lung: Secondary | ICD-10-CM | POA: Diagnosis present

## 2023-07-17 DIAGNOSIS — E66812 Obesity, class 2: Secondary | ICD-10-CM | POA: Diagnosis present

## 2023-07-17 DIAGNOSIS — E1165 Type 2 diabetes mellitus with hyperglycemia: Secondary | ICD-10-CM | POA: Diagnosis present

## 2023-07-17 DIAGNOSIS — E785 Hyperlipidemia, unspecified: Secondary | ICD-10-CM | POA: Diagnosis present

## 2023-07-17 DIAGNOSIS — Z823 Family history of stroke: Secondary | ICD-10-CM | POA: Diagnosis not present

## 2023-07-17 DIAGNOSIS — Z825 Family history of asthma and other chronic lower respiratory diseases: Secondary | ICD-10-CM | POA: Diagnosis not present

## 2023-07-17 DIAGNOSIS — Z7722 Contact with and (suspected) exposure to environmental tobacco smoke (acute) (chronic): Secondary | ICD-10-CM | POA: Diagnosis present

## 2023-07-17 DIAGNOSIS — J479 Bronchiectasis, uncomplicated: Secondary | ICD-10-CM

## 2023-07-17 DIAGNOSIS — J470 Bronchiectasis with acute lower respiratory infection: Secondary | ICD-10-CM | POA: Diagnosis present

## 2023-07-17 DIAGNOSIS — Z8261 Family history of arthritis: Secondary | ICD-10-CM | POA: Diagnosis not present

## 2023-07-17 DIAGNOSIS — Z7984 Long term (current) use of oral hypoglycemic drugs: Secondary | ICD-10-CM | POA: Diagnosis not present

## 2023-07-17 DIAGNOSIS — J1000 Influenza due to other identified influenza virus with unspecified type of pneumonia: Secondary | ICD-10-CM | POA: Diagnosis present

## 2023-07-17 DIAGNOSIS — Z800 Family history of malignant neoplasm of digestive organs: Secondary | ICD-10-CM | POA: Diagnosis not present

## 2023-07-17 DIAGNOSIS — Z886 Allergy status to analgesic agent status: Secondary | ICD-10-CM | POA: Diagnosis not present

## 2023-07-17 DIAGNOSIS — I10 Essential (primary) hypertension: Secondary | ICD-10-CM | POA: Diagnosis present

## 2023-07-17 DIAGNOSIS — Z6838 Body mass index (BMI) 38.0-38.9, adult: Secondary | ICD-10-CM | POA: Diagnosis not present

## 2023-07-17 DIAGNOSIS — Z811 Family history of alcohol abuse and dependence: Secondary | ICD-10-CM | POA: Diagnosis not present

## 2023-07-17 DIAGNOSIS — J9601 Acute respiratory failure with hypoxia: Secondary | ICD-10-CM | POA: Diagnosis not present

## 2023-07-17 DIAGNOSIS — Z833 Family history of diabetes mellitus: Secondary | ICD-10-CM | POA: Diagnosis not present

## 2023-07-17 DIAGNOSIS — G4733 Obstructive sleep apnea (adult) (pediatric): Secondary | ICD-10-CM | POA: Diagnosis present

## 2023-07-17 DIAGNOSIS — Z8249 Family history of ischemic heart disease and other diseases of the circulatory system: Secondary | ICD-10-CM | POA: Diagnosis not present

## 2023-07-17 DIAGNOSIS — Z813 Family history of other psychoactive substance abuse and dependence: Secondary | ICD-10-CM | POA: Diagnosis not present

## 2023-07-17 DIAGNOSIS — Z7951 Long term (current) use of inhaled steroids: Secondary | ICD-10-CM | POA: Diagnosis not present

## 2023-07-17 LAB — GLUCOSE, CAPILLARY
Glucose-Capillary: 138 mg/dL — ABNORMAL HIGH (ref 70–99)
Glucose-Capillary: 227 mg/dL — ABNORMAL HIGH (ref 70–99)
Glucose-Capillary: 190 mg/dL — ABNORMAL HIGH (ref 70–99)
Glucose-Capillary: 204 mg/dL — ABNORMAL HIGH (ref 70–99)
Glucose-Capillary: 219 mg/dL — ABNORMAL HIGH (ref 70–99)
Glucose-Capillary: 217 mg/dL — ABNORMAL HIGH (ref 70–99)

## 2023-07-17 LAB — CBC
HCT: 38.6 % — ABNORMAL LOW (ref 39.0–52.0)
Hemoglobin: 12.9 g/dL — ABNORMAL LOW (ref 13.0–17.0)
MCH: 28.6 pg (ref 26.0–34.0)
MCHC: 33.4 g/dL (ref 30.0–36.0)
MCV: 85.6 fL (ref 80.0–100.0)
Platelets: 197 10*3/uL (ref 150–400)
RBC: 4.51 MIL/uL (ref 4.22–5.81)
RDW: 13.1 % (ref 11.5–15.5)
WBC: 3 10*3/uL — ABNORMAL LOW (ref 4.0–10.5)
nRBC: 0 % (ref 0.0–0.2)

## 2023-07-17 LAB — EXPECTORATED SPUTUM ASSESSMENT W GRAM STAIN, RFLX TO RESP C

## 2023-07-17 LAB — COMPREHENSIVE METABOLIC PANEL
ALT: 45 U/L — ABNORMAL HIGH (ref 0–44)
AST: 36 U/L (ref 15–41)
Albumin: 3.9 g/dL (ref 3.5–5.0)
Alkaline Phosphatase: 63 U/L (ref 38–126)
Anion gap: 8 (ref 5–15)
BUN: 12 mg/dL (ref 6–20)
CO2: 23 mmol/L (ref 22–32)
Calcium: 9 mg/dL (ref 8.9–10.3)
Chloride: 104 mmol/L (ref 98–111)
Creatinine, Ser: 0.44 mg/dL — ABNORMAL LOW (ref 0.61–1.24)
GFR, Estimated: >60 mL/min (ref >60)
Glucose, Bld: 210 mg/dL — ABNORMAL HIGH (ref 70–99)
Potassium: 4.3 mmol/L (ref 3.5–5.1)
Sodium: 135 mmol/L (ref 135–145)
Total Bilirubin: 0.7 mg/dL (ref 0.0–1.2)
Total Protein: 7.2 g/dL (ref 6.5–8.1)

## 2023-07-17 LAB — HIV ANTIBODY (ROUTINE TESTING W REFLEX): HIV Screen 4th Generation wRfx: NONREACTIVE

## 2023-07-17 MED ORDER — ATORVASTATIN CALCIUM 10 MG PO TABS
10.0000 mg | ORAL_TABLET | Freq: Every day | ORAL | Status: DC
  Administered 2023-07-17 – 2023-07-19 (×3): 10 mg via ORAL
  Filled 2023-07-17 (×3): qty 1

## 2023-07-17 MED ORDER — MOMETASONE FURO-FORMOTEROL FUM 200-5 MCG/ACT IN AERO
2.0000 | INHALATION_SPRAY | Freq: Two times a day (BID) | RESPIRATORY_TRACT | Status: DC
Start: 2023-07-17 — End: 2023-07-19
  Administered 2023-07-17 – 2023-07-19 (×5): 2 via RESPIRATORY_TRACT
  Filled 2023-07-17: qty 8.8

## 2023-07-17 MED ORDER — METHYLPREDNISOLONE SODIUM SUCC 40 MG IJ SOLR
40.0000 mg | INTRAMUSCULAR | Status: DC
  Administered 2023-07-17 – 2023-07-18 (×2): 40 mg via INTRAVENOUS
  Filled 2023-07-17 (×2): qty 1

## 2023-07-17 MED ORDER — DOXYCYCLINE HYCLATE 100 MG PO TABS
100.0000 mg | ORAL_TABLET | Freq: Two times a day (BID) | ORAL | Status: DC
  Administered 2023-07-17 – 2023-07-19 (×6): 100 mg via ORAL
  Filled 2023-07-17 (×6): qty 1

## 2023-07-17 MED ORDER — OSELTAMIVIR PHOSPHATE 75 MG PO CAPS
75.0000 mg | ORAL_CAPSULE | Freq: Two times a day (BID) | ORAL | Status: DC
  Administered 2023-07-17: 75 mg via ORAL
  Filled 2023-07-17 (×3): qty 1

## 2023-07-17 MED ORDER — INSULIN ASPART 100 UNIT/ML IJ SOLN
0.0000 [IU] | Freq: Three times a day (TID) | INTRAMUSCULAR | Status: DC
  Administered 2023-07-17 (×3): 7 [IU] via SUBCUTANEOUS
  Administered 2023-07-18: 4 [IU] via SUBCUTANEOUS
  Administered 2023-07-18: 7 [IU] via SUBCUTANEOUS
  Administered 2023-07-18 – 2023-07-19 (×2): 11 [IU] via SUBCUTANEOUS

## 2023-07-17 MED ORDER — TRAZODONE HCL 50 MG PO TABS
50.0000 mg | ORAL_TABLET | Freq: Every evening | ORAL | Status: AC | PRN
  Administered 2023-07-17 – 2023-07-18 (×2): 50 mg via ORAL
  Filled 2023-07-17 (×2): qty 1

## 2023-07-17 MED ORDER — ALBUTEROL SULFATE (2.5 MG/3ML) 0.083% IN NEBU
2.5000 mg | INHALATION_SOLUTION | Freq: Four times a day (QID) | RESPIRATORY_TRACT | Status: DC | PRN
  Administered 2023-07-17 – 2023-07-18 (×2): 2.5 mg via RESPIRATORY_TRACT
  Filled 2023-07-17 (×2): qty 3

## 2023-07-17 MED ORDER — METHYLPREDNISOLONE SODIUM SUCC 40 MG IJ SOLR
120.0000 mg | INTRAMUSCULAR | Status: DC
  Administered 2023-07-17: 120 mg via INTRAVENOUS
  Filled 2023-07-17: qty 3

## 2023-07-17 MED ORDER — MORPHINE SULFATE (PF) 2 MG/ML IV SOLN: 2.0000 mg | INTRAVENOUS | Status: DC | PRN

## 2023-07-17 MED ORDER — HYDROCODONE BIT-HOMATROP MBR 5-1.5 MG/5ML PO SOLN: 5.0000 mL | Freq: Three times a day (TID) | ORAL | Status: DC | PRN

## 2023-07-17 MED ORDER — TAMSULOSIN HCL 0.4 MG PO CAPS
0.4000 mg | ORAL_CAPSULE | Freq: Every day | ORAL | Status: DC
  Administered 2023-07-17 – 2023-07-19 (×3): 0.4 mg via ORAL
  Filled 2023-07-17 (×3): qty 1

## 2023-07-17 MED ORDER — INSULIN ASPART 100 UNIT/ML IJ SOLN
0.0000 [IU] | Freq: Every day | INTRAMUSCULAR | Status: DC
  Administered 2023-07-17: 2 [IU] via SUBCUTANEOUS

## 2023-07-17 MED ORDER — LOSARTAN POTASSIUM 50 MG PO TABS
100.0000 mg | ORAL_TABLET | Freq: Every day | ORAL | Status: DC
  Administered 2023-07-17 – 2023-07-19 (×3): 100 mg via ORAL
  Filled 2023-07-17 (×3): qty 2

## 2023-07-17 MED ORDER — MOMETASONE FURO-FORMOTEROL FUM 200-5 MCG/ACT IN AERO
2.0000 | INHALATION_SPRAY | Freq: Two times a day (BID) | RESPIRATORY_TRACT | Status: DC
  Filled 2023-07-17: qty 8.8

## 2023-07-17 MED ORDER — SODIUM CHLORIDE 3 % IN NEBU
4.0000 mL | INHALATION_SOLUTION | Freq: Three times a day (TID) | RESPIRATORY_TRACT | Status: DC
  Administered 2023-07-17 – 2023-07-19 (×7): 4 mL via RESPIRATORY_TRACT
  Filled 2023-07-17 (×9): qty 4

## 2023-07-17 MED ORDER — AMLODIPINE BESYLATE 5 MG PO TABS
5.0000 mg | ORAL_TABLET | Freq: Every day | ORAL | Status: DC
  Administered 2023-07-17 – 2023-07-19 (×3): 5 mg via ORAL
  Filled 2023-07-17 (×3): qty 1

## 2023-07-17 MED ORDER — ENOXAPARIN SODIUM 40 MG/0.4ML IJ SOSY
40.0000 mg | PREFILLED_SYRINGE | INTRAMUSCULAR | Status: DC
  Administered 2023-07-17 – 2023-07-19 (×3): 40 mg via SUBCUTANEOUS
  Filled 2023-07-17 (×3): qty 0.4

## 2023-07-17 MED ORDER — TRAZODONE HCL 50 MG PO TABS
50.0000 mg | ORAL_TABLET | Freq: Once | ORAL | Status: AC
  Administered 2023-07-17: 50 mg via ORAL
  Filled 2023-07-17: qty 1

## 2023-07-17 NOTE — Telephone Encounter (Signed)
Patient of [name]. Sarcoidosis and bronchiectasis. Hospitalized with influenza A and now requiring oxygen. Will need follow up with him in 2-3 weeks.

## 2023-07-17 NOTE — Progress Notes (Signed)
PROGRESS NOTE    Jackson Hatfield  OZH:[id number] DOB: 05-Apr-1971 DOA: 07/16/2023 PCP: Esperanza Richters, PA-C   Brief Narrative:   Jackson Hatfield is a 53 y.o. male with medical history significant of bronchiectasis, sarcoidosis, diabetes type 2, GERD, hypertension, hyperlipidemia, BPH, obstructive sleep apnea on CPAP who was brought in from ER at [facility] with acute onset hypoxia after recent influenza diagnosis and treatment.  Patient also received concurrent Levaquin -antivirals and antibiotics initiated 07/12/2023.  Assessment & Plan:   Principal Problem:   Acute respiratory failure with hypoxia (HCC) Active Problems:   Sarcoidosis   OSA (obstructive sleep apnea)   Morbid obesity (HCC)   Bronchiectasis (HCC)  Acute respiratory failure with hypoxia:  Likely in the setting of recent influenza infection complicated by chronic bronchiectasis and sarcoidosis of the lung -Baseline patient is on room air able to ambulate moderate distances without profound dyspnea(upwards of 1/4 to 1/2 mile per discussion) -Continue to wean oxygen as tolerated, pulmonology consulted as below, appreciate insight recommendations -Discontinue Tamiflu given patient has now completed therapy -Continue doxycycline for broad-spectrum coverage -Continue steroids -may benefit from prolonged wean  Sarcoidosis/Bronchiectasis:  -Chronic, pulmonology consulted -Patient indicates outpatient follow-up at [facility] but is unsatisfied by the lack of therapy options and no real improvement despite multiple visits -It appears he has established with our pulmonology group outpatient in the past-with [name] -He and wife initially requesting referral for [facility] clinic.  Upon discussion with pulmonology he would likely be better served locally -consider follow-up with outpatient [facility] transplant services  Obstructive sleep apnea: Continue CPAP at night.  Needs a repeat sleep study per his job -explained this cannot  be done inpatient, follow-up outpatient pulmonology   Diabetes, type II, poorly controlled with hyperglycemia Continue diabetic diet, hypoglycemic protocol/sliding scale insulin  Morbid obesity: Dietary counseling paramount given patient's limited exercise tolerance in the setting of above -we did discuss GLP-1 inhibitors, he and wife are concerned for side effects Body mass index is 38.39 kg/m.  DVT prophylaxis: enoxaparin (LOVENOX) injection 40 mg Start: 07/17/23 1000   Code Status:   Code Status: Full Code  Family Communication: Wife at bedside  Status is: Inpatient  Dispo: The patient is from: Home              Anticipated d/c is to: Home              Anticipated d/c date is: 48 to 72 hours              Patient currently not medically stable for discharge  Consultants:  Pulmonology  Procedures:  None  Antimicrobials:  Doxycycline  Subjective: No acute issues or events overnight, respiratory status appears to be stabilizing, while not improving he has not improved either.  Otherwise denies chest pain nausea vomiting diarrhea constipation headache fevers chills.  Objective: Vitals:   07/16/23 2130 07/17/23 0013 07/17/23 0407 07/17/23 0823  BP: 139/61 (!) 166/73 (!) 149/82 (!) 149/82  Pulse: 63 72 70 74  Resp: 17 16 16 16   Temp:  98.5 F (36.9 C) 98.1 F (36.7 C) 98.5 F (36.9 C)  TempSrc:  Oral Oral Oral  SpO2: 94%  93% 93%  Weight:  135.6 kg    Height:  6\' 2"  (1.88 m)      Intake/Output Summary (Last 24 hours) at 07/17/2023 0839 Last data filed at 07/17/2023 0408 Gross per 24 hour  Intake --  Output 600 ml  Net -600 ml  Filed Weights   07/16/23 1056 07/17/23 0013  Weight: 135 kg 135.6 kg    Examination:  General:  Pleasantly resting in bed, No acute distress. HEENT:  Normocephalic atraumatic.  Sclerae nonicteric, noninjected.  Extraocular movements intact bilaterally. Neck:  Without mass or deformity.  Trachea is midline. Lungs: Diffuse bilateral  rhonchi, diminished left lower lobe sounds. Heart:  Regular rate and rhythm.  Without murmurs, rubs, or gallops. Abdomen:  Soft, nontender, nondistended.  Without guarding or rebound. Extremities: Without cyanosis, clubbing, edema, or obvious deformity. Skin:  Warm and dry, no erythema.  Data Reviewed: I have personally reviewed following labs and imaging studies  CBC: Recent Labs  Lab 07/15/23 1543 07/16/23 1054 07/17/23 0526  WBC 3.5* 4.1 3.0*  NEUTROABS 1,505 2.4  --   HGB 13.1* 13.1 12.9*  HCT 39.1 38.4* 38.6*  MCV 84.8 83.7 85.6  PLT 238 229 197   Basic Metabolic Panel: Recent Labs  Lab 07/15/23 1543 07/16/23 1054 07/17/23 0526  NA 137 136 135  K 4.3 4.1 4.3  CL 101 102 104  CO2 27 26 23   GLUCOSE 117* 134* 210*  BUN 15 12 12   CREATININE 0.78 0.71 0.44*  CALCIUM 9.1 9.0 9.0   GFR: Estimated Creatinine Clearance: 154.7 mL/min (A) (by C-G formula based on SCr of 0.44 mg/dL (L)). Liver Function Tests: Recent Labs  Lab 07/15/23 1543 07/17/23 0526  AST 30 36  ALT 36 45*  ALKPHOS  --  63  BILITOT 0.4 0.7  PROT 6.8 7.2  ALBUMIN  --  3.9   HbA1C: Recent Labs    07/15/23 1543  HGBA1C 7.3*   CBG: Recent Labs  Lab 07/17/23 0101 07/17/23 0724  GLUCAP 138* 227*   Lipid Profile: No results for input(s): "CHOL", "HDL", "LDLCALC", "TRIG", "CHOLHDL", "LDLDIRECT" in the last 72 hours. Thyroid Function Tests: No results for input(s): "TSH", "T4TOTAL", "FREET4", "T3FREE", "THYROIDAB" in the last 72 hours. Anemia Panel: No results for input(s): "VITAMINB12", "FOLATE", "FERRITIN", "TIBC", "IRON", "RETICCTPCT" in the last 72 hours. Sepsis Labs: No results for input(s): "PROCALCITON", "LATICACIDVEN" in the last 168 hours.  No results found for this or any previous visit (from the past 240 hours).       Radiology Studies: CT Chest W Contrast Result Date: 07/16/2023 CLINICAL DATA:  53 year old male with a history of sarcoidosis. Productive cough. Hypoxia.  Recently diagnosed with influenza. EXAM: CT CHEST WITH CONTRAST TECHNIQUE: Multidetector CT imaging of the chest was performed during intravenous contrast administration. RADIATION DOSE REDUCTION: This exam was performed according to the departmental dose-optimization program which includes automated exposure control, adjustment of the mA and/or kV according to patient size and/or use of iterative reconstruction technique. CONTRAST:  OMNIPAQUE IOHEXOL 300 MG/ML  SOLN COMPARISON:  Cardiac CT 03/17/2022. Chest radiographs yesterday. Chest CT 03/04/2022. FINDINGS: Cardiovascular: Stable heart size since 2023. No pericardial effusion. Minimal calcified atherosclerosis of the thoracic aorta. Mediastinum/Nodes: Chronic increased number of generally subcentimeter mediastinal lymph nodes, some faintly calcified (series 2, image 74), stable since 2023. The largest node is near the left hilum, 1.5 cm short axis. Lungs/Pleura: Dependent retained secretions in the trachea on series 4, image 31. These continue into the low left mainstem bronchus. There is severe chronic left lower lobe bronchiectasis with associated more proximal chronic airway thickening. Associated architectural distortion in the left lingula and lower lobe, stable nodularity since 2023. Left apical lung scarring and architectural distortion on series 4, image 29 is also stable. Scattered additional bilateral pulmonary nodularity also  has not significantly changed since 2023. Bronchiectasis in the medial segment of the right middle lobe is stable. Medial right lower lobe peribronchial patchy and nodular opacity is stable (series 4, image 116). No pleural effusion. No areas of worsening ventilation. Upper Abdomen: Evidence of chronic hepatic steatosis. Visible liver, gallbladder, spleen, pancreas, adrenal glands, left kidney and bowel appears stable and negative. Low level upper abdominal lymphadenopathy (series 2, image 170) also appears stable.  Musculoskeletal: No acute or suspicious osseous lesion. IMPRESSION: 1. Stable CT appearance of Thoracic Sarcoidosis since 2023. Associated chronic Bronchiectasis, architectural distortion maximal in the left lower lung. 2. Retained secretions in the trachea and left mainstem bronchus. But no evidence of acute pulmonary infection. No pleural effusion. Electronically Signed   By: Odessa Fleming M.D.   On: 07/16/2023 12:15   DG Chest 2 View Result Date: 07/15/2023 CLINICAL DATA:  History of sarcoid productive cough EXAM: CHEST - 2 VIEW COMPARISON:  04/11/2022, CT chest 03/04/2022, chest x-ray 04/04/2021 FINDINGS: Similar appearance of the lingula and left base compared to prior radiograph, likely corresponds to bronchiectasis and scarring seen on prior chest CT. Chronic volume loss left thorax corresponding to history of left lower lobectomy. No definite acute superimposed airspace disease compared to prior. Stable cardiomediastinal silhouette. No pneumothorax IMPRESSION: Similar appearance of volume loss on the left with chronic scarring and bronchiectasis in the left lower lung. No definite acute superimposed airspace disease compared to prior radiograph. Electronically Signed   By: Jasmine Pang M.D.   On: 07/15/2023 16:25        Scheduled Meds:  amLODipine  5 mg Oral Daily   atorvastatin  10 mg Oral Daily   doxycycline  100 mg Oral Q12H   enoxaparin (LOVENOX) injection  40 mg Subcutaneous Q24H   insulin aspart  0-20 Units Subcutaneous TID WC   insulin aspart  0-5 Units Subcutaneous QHS   losartan  100 mg Oral Daily   methylPREDNISolone sodium succinate  120 mg Intravenous Q24H   mometasone-formoterol  2 puff Inhalation BID   oseltamivir  75 mg Oral BID   sodium chloride HYPERTONIC  4 mL Nebulization TID   tamsulosin  0.4 mg Oral Daily   Continuous Infusions:   LOS: 0 days   Time spent:  Azucena Fallen, DO [facility]  If 7PM-7AM, please contact  night-coverage [email or web address]  07/17/2023, 8:39 AM

## 2023-07-17 NOTE — H&P (Signed)
History and Physical    Patient: Jackson Hatfield OAC:[id number] DOB: 12/16/1970 DOA: 07/16/2023 DOS: the patient was seen and examined on 07/17/2023 PCP: Esperanza Richters, PA-C  Patient coming from: Home  Chief Complaint:  Chief Complaint  Patient presents with   Shortness of Breath   HPI: Jackson Hatfield is a 53 y.o. male with medical history significant of bronchiectasis, sarcoidosis, diabetes type 2, GERD, hypertension, hyperlipidemia, BPH, obstructive sleep apnea on CPAP who was brought in from ER at [facility] where he went with persistent hypoxia cough as well as other respiratory symptoms.  Patient apparently had influenza infection, which triggered his breathing problems.  He was treated with Levaquin.  Also Tamiflu started.  No steroids were given.  Patient continues to have significant shortness of breath and cough.  He has become hypoxic.  Went to the ER today where he was seen and evaluated and admitted for failing outpatient treatment.  He is still having significant cough and shortness of breath.  Patient wants to know was going on.  CT angiogram of the chest showed evidence of his sarcoidosis only.  At this point he appears to have a combination of sarcoidosis, bronchiectasis as well as possible pneumonia.  Review of Systems: As mentioned in the history of present illness. All other systems reviewed and are negative. Past Medical History:  Diagnosis Date   Blood transfusion without reported diagnosis    many years ago   Diabetes mellitus without complication (HCC)    Dyspnea    exerting self,ie: running   GERD (gastroesophageal reflux disease)    ? medicine related   Hyperlipidemia    Hypertension    Pneumonia    Pre-diabetes    Sarcoidosis    Sleep apnea    wears cpap   Past Surgical History:  Procedure Laterality Date   BRONCHIAL WASHINGS  04/26/2021   Procedure: BRONCHIAL WASHINGS;  Surgeon: Martina Sinner, MD;  Location: [facility];  Service:  Pulmonary;;   KNEE ARTHROSCOPY WITH MEDIAL MENISECTOMY Right 06/18/2018   Procedure: Right knee arthroscopy, partial medial menisectomy, debridement and drainage of cyst;  Surgeon: Jene Every, MD;  Location: [facility] OR;  Service: Orthopedics;  Laterality: Right;  60 mins   LOBECTOMY Left 2002   lower left  ( cyst on the lobe)in [facility] [place], [place]   VIDEO BRONCHOSCOPY N/A 04/26/2021   Procedure: FLEX BRONCHOSCOPY WITHOUT FLUORO;  Surgeon: Martina Sinner, MD;  Location: [facility];  Service: Pulmonary;  Laterality: N/A;   Social History:  reports that he has quit smoking. His smoking use included cigars. He has never used smokeless tobacco. He reports current alcohol use. He reports that he does not use drugs.  Allergies  Allergen Reactions   Naproxen Hives   Penicillin G     Family History  Problem Relation Age of Onset   COPD Mother    Alcohol abuse Father    Arthritis Father    Diabetes Father    Heart disease Father    Hyperlipidemia Father    Kidney disease Father    Diabetes Brother    Stroke Brother    Alcohol abuse Brother    Diabetes Brother    Drug abuse Brother    Stomach cancer Paternal Uncle    Colon cancer Neg Hx    Colon polyps Neg Hx    Esophageal cancer Neg Hx    Rectal cancer Neg Hx     Prior to Admission medications  Medication Sig Start Date End Date Taking? Authorizing Provider  albuterol (PROVENTIL) (2.5 MG/3ML) 0.083% nebulizer solution Take 3 mLs (2.5 mg total) by nebulization 3 (three) times daily as needed for wheezing or shortness of breath. 04/11/21  Yes Martina Sinner, MD  albuterol (VENTOLIN HFA) 108 (90 Base) MCG/ACT inhaler Inhale 2 puffs into the lungs every 4 (four) hours as needed for wheezing or shortness of breath. 06/11/23  Yes [name], Ramon Dredge, PA-C  amLODipine (NORVASC) 5 MG tablet Take 1 tablet (5 mg total) by mouth daily. 06/11/23  Yes [name], Ramon Dredge, PA-C  atorvastatin (LIPITOR) 10 MG tablet TAKE 1 TABLET  BY MOUTH EVERY DAY 11/28/22  Yes [name], Ramon Dredge, PA-C  levofloxacin (LEVAQUIN) 750 MG tablet Take 1 tablet (750 mg total) by mouth daily. 07/15/23  Yes [name], Ramon Dredge, PA-C  losartan (COZAAR) 100 MG tablet Take 1 tablet (100 mg total) by mouth daily. 06/11/23  Yes [name], Ramon Dredge, PA-C  metFORMIN (GLUCOPHAGE) 500 MG tablet Take 1 tablet (500 mg total) by mouth 2 (two) times daily with a meal. 06/11/23  Yes [name], Ramon Dredge, PA-C  Multiple Vitamins-Minerals (MENS ONE DAILY PO) Take 1 tablet by mouth daily.    Yes [provider]  oseltamivir (TAMIFLU) 75 MG capsule Take 75 mg by mouth 2 (two) times daily. 07/12/23  Yes [provider]  tadalafil (CIALIS) 5 MG tablet Take 1 tablet (5 mg total) by mouth daily as needed for erectile dysfunction. 05/12/23  Yes Dahlstedt, Jeannett Senior, MD  tamsulosin (FLOMAX) 0.4 MG CAPS capsule Take 1 capsule (0.4 mg total) by mouth in the morning and at bedtime. 12/29/22  Yes Donnita Falls, FNP  budesonide-formoterol (SYMBICORT) 160-4.5 MCG/ACT inhaler Inhale 2 puffs into the lungs 2 (two) times daily. 07/15/23   [name], Ramon Dredge, PA-C  HYDROcodone bit-homatropine (HYCODAN) 5-1.5 MG/5ML syrup Take 5 mLs by mouth every 8 (eight) hours as needed for cough. 07/15/23   [name], Ramon Dredge, PA-C  sildenafil (REVATIO) 20 MG tablet Take 2 - 5 tablets (40 mg - 100 mg) as one single dose at least 60 minutes prior to anticipated sexual activity to achieve erection. Do not use more than once per day. Patient not taking: Reported on 07/16/2023 03/06/23   [name], Ramon Dredge, PA-C  sodium chloride HYPERTONIC 3 % nebulizer solution Take by nebulization 3 (three) times daily. Use flutter valve after each nebulizer use Patient not taking: Reported on 07/16/2023 07/01/21   Martina Sinner, MD    Physical Exam: Vitals:   07/16/23 2030 07/16/23 2100 07/16/23 2130 07/17/23 0013  BP: 124/60 125/68 139/61 (!) 166/73  Pulse: 62 63 63 72  Resp: 19 15 17 16   Temp:    98.5 F (36.9 C)   TempSrc:    Oral  SpO2: 94% 94% 94%   Weight:    135.6 kg  Height:    6\' 2"  (1.88 m)   Constitutional: NAD, calm, comfortable Eyes: PERRL, lids and conjunctivae normal ENMT: Mucous membranes are moist. Posterior pharynx clear of any exudate or lesions.Normal dentition.  Neck: normal, supple, no masses, no thyromegaly Respiratory: Decreased air entry bilaterally, no wheezing, no crackles. Normal respiratory effort. No accessory muscle use.  Cardiovascular: Regular rate and rhythm, no murmurs / rubs / gallops. No extremity edema. 2+ pedal pulses. No carotid bruits.  Abdomen: no tenderness, no masses palpated. No hepatosplenomegaly. Bowel sounds positive.  Musculoskeletal: Good range of motion, no joint swelling or tenderness, Skin: no rashes, lesions, ulcers. No induration Neurologic: CN 2-12 grossly intact. Sensation intact, DTR normal.  Strength 5/5 in all 4.  Psychiatric: Normal judgment and insight. Alert and oriented x 3. Normal mood  Data Reviewed:  Glucose 134 CT angiogram of the chest shows stable CT appearance of the likely disease since 2023 associated chronic bronchiectasis and retained secretions in the trachea and left main bronchus no evidence of acute infection  Assessment and Plan:  #1 acute respiratory failure with hypoxia: Most likely sequela of influenza infection in the setting of bronchiectasis and sarcoidosis of the lungs.  Patient is still having significant shortness of breath.  Also hypoxia.  At this point we will admit the patient.  Will treat him with steroids antibiotics and breathing treatments.  Patient has had recent flu and will complete Tamiflu by tomorrow.  Continue cough medications as well  #2 bronchiectasis: Probably reactive secondary to the flu.  We will continue with steroids breathing treatments and antibiotics as above.  #3 sarcoidosis: Chronic pulmonary sarcoidosis.  He is being followed up in [facility].  Patient may require pulmonary consult if  he is not improving.  #4 obstructive sleep apnea: Continue CPAP at night.  #5 morbid obesity: Dietary counseling    Advance Care Planning:   Code Status: Not on file full code  Consults: None  Family Communication: No family at bedside  Severity of Illness: The appropriate patient status for this patient is INPATIENT. Inpatient status is judged to be reasonable and necessary in order to provide the required intensity of service to ensure the patient's safety. The patient's presenting symptoms, physical exam findings, and initial radiographic and laboratory data in the context of their chronic comorbidities is felt to place them at high risk for further clinical deterioration. Furthermore, it is not anticipated that the patient will be medically stable for discharge from the hospital within 2 midnights of admission.   * I certify that at the point of admission it is my clinical judgment that the patient will require inpatient hospital care spanning beyond 2 midnights from the point of admission due to high intensity of service, high risk for further deterioration and high frequency of surveillance required.*  AuthorLonia Blood, MD 07/17/2023 12:20 AM  For on call review [email or web address].

## 2023-07-17 NOTE — Consult Note (Signed)
NAME:  Jackson Hatfield, MRN:  [id number], DOB:  06-Jan-1971, LOS: 0 ADMISSION DATE:  07/16/2023, CONSULTATION DATE:  07/17/2023 REFERRING MD:  Azucena Fallen, MD, CHIEF COMPLAINT:  influenza A   History of Present Illness:  Jackson Hatfield is a 53 year old man with past medical history of pulmonary sarcoidosis, biopsy-proven in the early 2000's in [place] by mediastinoscopy, focal cystic bronchiectasis of the left lower lobe status post left lower lung lobectomy who presents with worsening dyspnea on exertion and respiratory failure secondary to influenza A.  Symptoms started last weekend and he was given a course of Tamiflu at that time.  He was coughing up a lot of mucus the last several days but this seems to have slowed down.  Patient was following with [name] in our pulmonary clinic and had been sent to see Dr. Doralee Albino at [facility] bronchiectasis clinic.  At baseline prior to admission he was not on any kind of airway clearance and was using albuterol as needed only.  He notes over the last 6 months worsening dyspnea on exertion and coughing.  He denies any fevers chills night sweats other than what was going on in the last week due to influenza.  He denies coughing up large amounts of sputum at baseline, hemoptysis, wheezing.  He had been seen in pulmonary clinic in here in [place] but was apparently lost to follow-up.  He was last seen by pulmonologist back in May 2024 at [facility].  He has never been on any long-term management for his pulmonary sarcoidosis.  He does not have any records from this biopsy.  He says he was given a month of prednisone around that time but nothing further and no other immunosuppression medications.  He denies nephrolithiasis, cardiac arrhythmia, eye issues, skin issues.  His partner is at bedside and inquiring about next up source pulmonary sarcoidosis.  He is currently on steroids IV Solu-Medrol, ICS LAMA inhaled, bronchodilators, and 4 L of oxygen.  He is not on  oxygen at home.  He is also getting Tamiflu.  Pertinent  Medical History    Significant Hospital Events: Including procedures, antibiotic start and stop dates in addition to other pertinent events     Interim History / Subjective:    Objective   Blood pressure 126/75, pulse 74, temperature 98 F (36.7 C), temperature source Oral, resp. rate 18, height 6\' 2"  (1.88 m), weight 135.6 kg, SpO2 91%.    FiO2 (%):  [36 %] 36 %   Intake/Output Summary (Last 24 hours) at 07/17/2023 1658 Last data filed at 07/17/2023 0945 Gross per 24 hour  Intake 360 ml  Output 900 ml  Net -540 ml   Filed Weights   07/16/23 1056 07/17/23 0013  Weight: 135 kg 135.6 kg    Examination: General: well nourished, well appearing HENT: on nasal cannula Lungs: diminished, no wheeze, occasional rhonchi that clear with coughing when Cardiovascular: RRR no mrg Abdomen: obese, soft, nontender Extremities: no le edema Neuro: normal speech no focal asymmetry MSK: no rashes  Labs reviewed:  During his most recent evaluation at [facility] bronchiectasis clinic he had immunoglobulins that were checked within normal limits, Aspergillus antigen was checked and negative, and he had bronchoscopy with sputum culture which grew haemophilus influenza and was treated with antibiotics.  Extensive records reviewed - pulmonology at [facility] and [facility] PFTs May - 2024 moderately severe airflow limitation per report.   Resolved Hospital Problem list     Assessment & Plan:   Acute  hypoxemic respiratory failure Influenza A infection Post-infectious non-CF bronchiectasis Possible pulmonary sarcoidosis Passive smoke exposure Steroid induced hyperglycemia Jackson Hatfield has worsening respiratory morbidity over the last few months.  It is difficult to determine whether this is related to sarcoidosis versus bronchiectasis.  Although given the fact that majority of symptoms are dyspnea and coughing rather than sputum production I am  inclined to think this is more sarcoidosis.  He is inquiring about next up for sarcoidosis.  I think we need to understand his disease activity and severity.  The best way to do this would be to order pulmonary function testing after he has recovered from his recent illness.  I told him there is a high likelihood he will be discharged home oxygen but hopefully this can be tapered off and reevaluate as an outpatient.  In the meantime he needs to recover from his influenza A.  I have titrated down his Solu-Medrol to 40 mg daily given his hyperglycemia.  If he can bring up some sputum we will send AFB and Gram stain and culture expectorated sputum.  I think he can do 5 days total of prednisone versus Solu-Medrol for 40 mg.  I do think he should be discharged on ICS/LABA since he does perceive some benefit.  I will set him up to see [place] in the office. Will send an ace level  I have reviewed the following today: Baseline serum creatinine for renal sarcoidosis - wnl 07/17/23 Baseline serum alkaline phosphatase for hepatic sarcoidosis - wnl 07/17/23 Baseline serum calcium - wnl 07/17/23 Baseline serum complete blood count - wnl 07/17/23 Baseline EKG for cardiac sarcoidosis - no evidence of IV conduction delay on 1/23 ekg  As an outpatient he will need at some point a baseline eye examination to evaluate for ocular sarcoidosis  Reference: Diagnosis and Detection of Sarcoidosis: An [employer] Clinical Practice Guideline Am J Respir Crit Care Med Vol 201, Iss 8, pp e26-e51, Oct 06, 2018   I spent 80 minutes in total visit time for this patient, with more than 50% spent counseling/coordinating care.   Labs   CBC: Recent Labs  Lab 07/15/23 1543 07/16/23 1054 07/17/23 0526  WBC 3.5* 4.1 3.0*  NEUTROABS 1,505 2.4  --   HGB 13.1* 13.1 12.9*  HCT 39.1 38.4* 38.6*  MCV 84.8 83.7 85.6  PLT 238 229 197    Basic Metabolic Panel: Recent Labs  Lab 07/15/23 1543 07/16/23 1054  07/17/23 0526  NA 137 136 135  K 4.3 4.1 4.3  CL 101 102 104  CO2 27 26 23   GLUCOSE 117* 134* 210*  BUN 15 12 12   CREATININE 0.78 0.71 0.44*  CALCIUM 9.1 9.0 9.0   GFR: Estimated Creatinine Clearance: 154.7 mL/min (A) (by C-G formula based on SCr of 0.44 mg/dL (L)). Recent Labs  Lab 07/15/23 1543 07/16/23 1054 07/17/23 0526  WBC 3.5* 4.1 3.0*    Liver Function Tests: Recent Labs  Lab 07/15/23 1543 07/17/23 0526  AST 30 36  ALT 36 45*  ALKPHOS  --  63  BILITOT 0.4 0.7  PROT 6.8 7.2  ALBUMIN  --  3.9   No results for input(s): "LIPASE", "AMYLASE" in the last 168 hours. No results for input(s): "AMMONIA" in the last 168 hours.  ABG    Component Value Date/Time   HCO3 31.6 (H) 03/04/2022 1318   O2SAT 26.3 03/04/2022 1318     Coagulation Profile: No results for input(s): "INR", "PROTIME" in the last 168 hours.  Cardiac Enzymes: No results for input(s): "CKTOTAL", "CKMB", "CKMBINDEX", "TROPONINI" in the last 168 hours.  HbA1C: Hgb A1c MFr Bld  Date/Time Value Ref Range Status  07/15/2023 03:43 PM 7.3 (H) <5.7 % of total Hgb Final    Comment:    For someone without known diabetes, a hemoglobin A1c value of 6.5% or greater indicates that they may have  diabetes and this should be confirmed with a follow-up  test. . For someone with known diabetes, a value <7% indicates  that their diabetes is well controlled and a value  greater than or equal to 7% indicates suboptimal  control. A1c targets should be individualized based on  duration of diabetes, age, comorbid conditions, and  other considerations. . Currently, no consensus exists regarding use of hemoglobin A1c for diagnosis of diabetes for children. Marland Kitchen   03/06/2023 04:17 PM 7.7 (H) <5.7 % of total Hgb Final    Comment:    For someone without known diabetes, a hemoglobin A1c value of 6.5% or greater indicates that they may have  diabetes and this should be confirmed with a follow-up  test. . For  someone with known diabetes, a value <7% indicates  that their diabetes is well controlled and a value  greater than or equal to 7% indicates suboptimal  control. A1c targets should be individualized based on  duration of diabetes, age, comorbid conditions, and  other considerations. . Currently, no consensus exists regarding use of hemoglobin A1c for diagnosis of diabetes for children. .     CBG: Recent Labs  Lab 07/17/23 0101 07/17/23 0724 07/17/23 1108 07/17/23 1250 07/17/23 1637  GLUCAP 138* 227* 190* 204* 219*    Review of Systems:   As in HPI  Past Medical History:  He,  has a past medical history of Blood transfusion without reported diagnosis, Diabetes mellitus without complication (HCC), Dyspnea, GERD (gastroesophageal reflux disease), Hyperlipidemia, Hypertension, Pneumonia, Pre-diabetes, Sarcoidosis, and Sleep apnea.   Surgical History:   Past Surgical History:  Procedure Laterality Date   BRONCHIAL WASHINGS  04/26/2021   Procedure: BRONCHIAL WASHINGS;  Surgeon: Martina Sinner, MD;  Location: [facility];  Service: Pulmonary;;   KNEE ARTHROSCOPY WITH MEDIAL MENISECTOMY Right 06/18/2018   Procedure: Right knee arthroscopy, partial medial menisectomy, debridement and drainage of cyst;  Surgeon: Jene Every, MD;  Location: [facility] OR;  Service: Orthopedics;  Laterality: Right;  60 mins   LOBECTOMY Left 2002   lower left  ( cyst on the lobe)in [facility] in [place], [place]   VIDEO BRONCHOSCOPY N/A 04/26/2021   Procedure: FLEX BRONCHOSCOPY WITHOUT FLUORO;  Surgeon: Martina Sinner, MD;  Location: [facility];  Service: Pulmonary;  Laterality: N/A;     Social History:   reports that he has quit smoking. His smoking use included cigars. He has never used smokeless tobacco. He reports current alcohol use. He reports that he does not use drugs.   Family History:  His family history includes Alcohol abuse in his brother and father; Arthritis in his  father; COPD in his mother; Diabetes in his brother, brother, and father; Drug abuse in his brother; Heart disease in his father; Hyperlipidemia in his father; Kidney disease in his father; Stomach cancer in his paternal uncle; Stroke in his brother. There is no history of Colon cancer, Colon polyps, Esophageal cancer, or Rectal cancer.   Allergies Allergies  Allergen Reactions   Naproxen Hives   Penicillin G      Home Medications  Prior to Admission medications  Medication Sig Start Date End Date Taking? Authorizing Provider  albuterol (PROVENTIL) (2.5 MG/3ML) 0.083% nebulizer solution Take 3 mLs (2.5 mg total) by nebulization 3 (three) times daily as needed for wheezing or shortness of breath. 04/11/21  Yes Martina Sinner, MD  albuterol (VENTOLIN HFA) 108 (90 Base) MCG/ACT inhaler Inhale 2 puffs into the lungs every 4 (four) hours as needed for wheezing or shortness of breath. 06/11/23  Yes [name], Ramon Dredge, PA-C  amLODipine (NORVASC) 5 MG tablet Take 1 tablet (5 mg total) by mouth daily. 06/11/23  Yes [name], Ramon Dredge, PA-C  atorvastatin (LIPITOR) 10 MG tablet TAKE 1 TABLET BY MOUTH EVERY DAY 11/28/22  Yes [name], Ramon Dredge, PA-C  levofloxacin (LEVAQUIN) 750 MG tablet Take 1 tablet (750 mg total) by mouth daily. 07/15/23  Yes [name], Ramon Dredge, PA-C  losartan (COZAAR) 100 MG tablet Take 1 tablet (100 mg total) by mouth daily. 06/11/23  Yes [name], Ramon Dredge, PA-C  metFORMIN (GLUCOPHAGE) 500 MG tablet Take 1 tablet (500 mg total) by mouth 2 (two) times daily with a meal. 06/11/23  Yes [name], Ramon Dredge, PA-C  Multiple Vitamins-Minerals (MENS ONE DAILY PO) Take 1 tablet by mouth daily.    Yes [provider]  oseltamivir (TAMIFLU) 75 MG capsule Take 75 mg by mouth 2 (two) times daily. 07/12/23  Yes [provider]  tadalafil (CIALIS) 5 MG tablet Take 1 tablet (5 mg total) by mouth daily as needed for erectile dysfunction. 05/12/23  Yes Dahlstedt, Jeannett Senior, MD  tamsulosin (FLOMAX) 0.4  MG CAPS capsule Take 1 capsule (0.4 mg total) by mouth in the morning and at bedtime. 12/29/22  Yes Donnita Falls, FNP  budesonide-formoterol (SYMBICORT) 160-4.5 MCG/ACT inhaler Inhale 2 puffs into the lungs 2 (two) times daily. 07/15/23   [name], Ramon Dredge, PA-C  HYDROcodone bit-homatropine (HYCODAN) 5-1.5 MG/5ML syrup Take 5 mLs by mouth every 8 (eight) hours as needed for cough. 07/15/23   [name], Ramon Dredge, PA-C  sildenafil (REVATIO) 20 MG tablet Take 2 - 5 tablets (40 mg - 100 mg) as one single dose at least 60 minutes prior to anticipated sexual activity to achieve erection. Do not use more than once per day. Patient not taking: Reported on 07/16/2023 03/06/23   [name], Ramon Dredge, PA-C  sodium chloride HYPERTONIC 3 % nebulizer solution Take by nebulization 3 (three) times daily. Use flutter valve after each nebulizer use Patient not taking: Reported on 07/16/2023 07/01/21   Martina Sinner, MD

## 2023-07-17 NOTE — Plan of Care (Signed)
Problem: Education: Goal: Knowledge of General Education information will improve Description: Including pain rating scale, medication(s)/side effects and non-pharmacologic comfort measures 07/17/2023 0433 by Elbert Ewings, RN Outcome: Progressing 07/17/2023 0432 by Elbert Ewings, RN Outcome: Progressing   Problem: Health Behavior/Discharge Planning: Goal: Ability to manage health-related needs will improve 07/17/2023 0433 by Elbert Ewings, RN Outcome: Progressing 07/17/2023 0432 by Elbert Ewings, RN Outcome: Progressing   Problem: Clinical Measurements: Goal: Ability to maintain clinical measurements within normal limits will improve 07/17/2023 0433 by Elbert Ewings, RN Outcome: Progressing 07/17/2023 0432 by Elbert Ewings, RN Outcome: Progressing Goal: Will remain free from infection 07/17/2023 0433 by Elbert Ewings, RN Outcome: Progressing 07/17/2023 0432 by Elbert Ewings, RN Outcome: Progressing Goal: Diagnostic test results will improve 07/17/2023 0433 by Elbert Ewings, RN Outcome: Progressing 07/17/2023 0432 by Elbert Ewings, RN Outcome: Progressing Goal: Respiratory complications will improve 07/17/2023 0433 by Elbert Ewings, RN Outcome: Progressing 07/17/2023 0432 by Elbert Ewings, RN Outcome: Progressing Goal: Cardiovascular complication will be avoided 07/17/2023 0433 by Elbert Ewings, RN Outcome: Progressing 07/17/2023 0432 by Elbert Ewings, RN Outcome: Progressing   Problem: Activity: Goal: Risk for activity intolerance will decrease 07/17/2023 0433 by Elbert Ewings, RN Outcome: Progressing 07/17/2023 0432 by Elbert Ewings, RN Outcome: Progressing   Problem: Nutrition: Goal: Adequate nutrition will be maintained 07/17/2023 0433 by Elbert Ewings, RN Outcome: Progressing 07/17/2023 0432 by Elbert Ewings, RN Outcome: Progressing   Problem: Coping: Goal: Level of anxiety will decrease 07/17/2023 0433 by Elbert Ewings, RN Outcome: Progressing 07/17/2023 0432 by Elbert Ewings, RN Outcome: Progressing   Problem: Elimination: Goal: Will not experience complications related to bowel motility 07/17/2023 0433 by Elbert Ewings, RN Outcome: Progressing 07/17/2023 0432 by Elbert Ewings, RN Outcome: Progressing Goal: Will not experience complications related to urinary retention 07/17/2023 0433 by Elbert Ewings, RN Outcome: Progressing 07/17/2023 0432 by Elbert Ewings, RN Outcome: Progressing   Problem: Pain Managment: Goal: General experience of comfort will improve and/or be controlled 07/17/2023 0433 by Elbert Ewings, RN Outcome: Progressing 07/17/2023 0432 by Elbert Ewings, RN Outcome: Progressing   Problem: Safety: Goal: Ability to remain free from injury will improve 07/17/2023 0433 by Elbert Ewings, RN Outcome: Progressing 07/17/2023 0432 by Elbert Ewings, RN Outcome: Progressing   Problem: Skin Integrity: Goal: Risk for impaired skin integrity will decrease 07/17/2023 0433 by Elbert Ewings, RN Outcome: Progressing 07/17/2023 0432 by Elbert Ewings, RN Outcome: Progressing   Problem: Education: Goal: Ability to describe self-care measures that may prevent or decrease complications (Diabetes Survival Skills Education) will improve 07/17/2023 0433 by Elbert Ewings, RN Outcome: Progressing 07/17/2023 0432 by Elbert Ewings, RN Outcome: Progressing Goal: Individualized Educational Video(s) 07/17/2023 0433 by Elbert Ewings, RN Outcome: Progressing 07/17/2023 0432 by Elbert Ewings, RN Outcome: Progressing   Problem: Coping: Goal: Ability to adjust to condition or change in health will improve 07/17/2023 0433 by Elbert Ewings, RN Outcome: Progressing 07/17/2023 0432 by Elbert Ewings, RN Outcome: Progressing   Problem: Fluid Volume: Goal: Ability to maintain a balanced intake and output will improve 07/17/2023 0433 by Elbert Ewings,  RN Outcome: Progressing 07/17/2023 0432 by Elbert Ewings, RN Outcome: Progressing   Problem: Health Behavior/Discharge Planning: Goal: Ability to identify and utilize available resources and services will improve 07/17/2023 0433 by Elbert Ewings, RN Outcome: Progressing 07/17/2023 0432 by  Deaira Leckey L, RN Outcome: Progressing Goal: Ability to manage health-related needs will improve 07/17/2023 0433 by Elbert Ewings, RN Outcome: Progressing 07/17/2023 0432 by Elbert Ewings, RN Outcome: Progressing   Problem: Metabolic: Goal: Ability to maintain appropriate glucose levels will improve 07/17/2023 0433 by Elbert Ewings, RN Outcome: Progressing 07/17/2023 0432 by Elbert Ewings, RN Outcome: Progressing   Problem: Nutritional: Goal: Maintenance of adequate nutrition will improve 07/17/2023 0433 by Elbert Ewings, RN Outcome: Progressing 07/17/2023 0432 by Elbert Ewings, RN Outcome: Progressing Goal: Progress toward achieving an optimal weight will improve 07/17/2023 0433 by Elbert Ewings, RN Outcome: Progressing 07/17/2023 0432 by Elbert Ewings, RN Outcome: Progressing   Problem: Skin Integrity: Goal: Risk for impaired skin integrity will decrease 07/17/2023 0433 by Elbert Ewings, RN Outcome: Progressing 07/17/2023 0432 by Elbert Ewings, RN Outcome: Progressing   Problem: Tissue Perfusion: Goal: Adequacy of tissue perfusion will improve 07/17/2023 0433 by Elbert Ewings, RN Outcome: Progressing 07/17/2023 0432 by Elbert Ewings, RN Outcome: Progressing   Problem: Education: Goal: Ability to describe self-care measures that may prevent or decrease complications (Diabetes Survival Skills Education) will improve 07/17/2023 0433 by Elbert Ewings, RN Outcome: Progressing 07/17/2023 0432 by Elbert Ewings, RN Outcome: Progressing Goal: Individualized Educational Video(s) 07/17/2023 0433 by Elbert Ewings, RN Outcome: Progressing 07/17/2023  0432 by Elbert Ewings, RN Outcome: Progressing   Problem: Coping: Goal: Ability to adjust to condition or change in health will improve 07/17/2023 0433 by Elbert Ewings, RN Outcome: Progressing 07/17/2023 0432 by Elbert Ewings, RN Outcome: Progressing   Problem: Fluid Volume: Goal: Ability to maintain a balanced intake and output will improve 07/17/2023 0433 by Elbert Ewings, RN Outcome: Progressing 07/17/2023 0432 by Elbert Ewings, RN Outcome: Progressing   Problem: Health Behavior/Discharge Planning: Goal: Ability to identify and utilize available resources and services will improve 07/17/2023 0433 by Elbert Ewings, RN Outcome: Progressing 07/17/2023 0432 by Elbert Ewings, RN Outcome: Progressing Goal: Ability to manage health-related needs will improve 07/17/2023 0433 by Elbert Ewings, RN Outcome: Progressing 07/17/2023 0432 by Elbert Ewings, RN Outcome: Progressing   Problem: Metabolic: Goal: Ability to maintain appropriate glucose levels will improve 07/17/2023 0433 by Elbert Ewings, RN Outcome: Progressing 07/17/2023 0432 by Elbert Ewings, RN Outcome: Progressing   Problem: Nutritional: Goal: Maintenance of adequate nutrition will improve 07/17/2023 0433 by Elbert Ewings, RN Outcome: Progressing 07/17/2023 0432 by Elbert Ewings, RN Outcome: Progressing Goal: Progress toward achieving an optimal weight will improve 07/17/2023 0433 by Elbert Ewings, RN Outcome: Progressing 07/17/2023 0432 by Elbert Ewings, RN Outcome: Progressing   Problem: Skin Integrity: Goal: Risk for impaired skin integrity will decrease 07/17/2023 0433 by Elbert Ewings, RN Outcome: Progressing 07/17/2023 0432 by Elbert Ewings, RN Outcome: Progressing   Problem: Tissue Perfusion: Goal: Adequacy of tissue perfusion will improve 07/17/2023 0433 by Elbert Ewings, RN Outcome: Progressing 07/17/2023 0432 by Elbert Ewings, RN Outcome: Progressing    Problem: Education: Goal: Ability to describe self-care measures that may prevent or decrease complications (Diabetes Survival Skills Education) will improve Outcome: Progressing Goal: Individualized Educational Video(s) Outcome: Progressing   Problem: Coping: Goal: Ability to adjust to condition or change in health will improve Outcome: Progressing   Problem: Fluid Volume: Goal: Ability to maintain a balanced intake and output will improve Outcome: Progressing   Problem: Health Behavior/Discharge Planning: Goal: Ability to identify and utilize  available resources and services will improve Outcome: Progressing Goal: Ability to manage health-related needs will improve Outcome: Progressing   Problem: Metabolic: Goal: Ability to maintain appropriate glucose levels will improve Outcome: Progressing

## 2023-07-17 NOTE — Progress Notes (Signed)
SATURATION QUALIFICATIONS: (This note is used to comply with regulatory documentation for home oxygen)  Patient Saturations on Room Air at Rest = 90%  Patient Saturations on Room Air while Ambulating = 88%  Patient Saturations on 4 Liters of oxygen while Ambulating = 92%

## 2023-07-18 DIAGNOSIS — J9601 Acute respiratory failure with hypoxia: Secondary | ICD-10-CM | POA: Diagnosis not present

## 2023-07-18 LAB — CBC
HCT: 39.2 % (ref 39.0–52.0)
Hemoglobin: 13.2 g/dL (ref 13.0–17.0)
MCH: 28.6 pg (ref 26.0–34.0)
MCHC: 33.7 g/dL (ref 30.0–36.0)
MCV: 85 fL (ref 80.0–100.0)
Platelets: 264 10*3/uL (ref 150–400)
RBC: 4.61 MIL/uL (ref 4.22–5.81)
RDW: 12.9 % (ref 11.5–15.5)
WBC: 8.2 10*3/uL (ref 4.0–10.5)
nRBC: 0 % (ref 0.0–0.2)

## 2023-07-18 LAB — GLUCOSE, CAPILLARY
Glucose-Capillary: 248 mg/dL — ABNORMAL HIGH (ref 70–99)
Glucose-Capillary: 280 mg/dL — ABNORMAL HIGH (ref 70–99)
Glucose-Capillary: 171 mg/dL — ABNORMAL HIGH (ref 70–99)
Glucose-Capillary: 189 mg/dL — ABNORMAL HIGH (ref 70–99)

## 2023-07-18 LAB — COMPREHENSIVE METABOLIC PANEL
ALT: 43 U/L (ref 0–44)
AST: 26 U/L (ref 15–41)
Albumin: 3.7 g/dL (ref 3.5–5.0)
Alkaline Phosphatase: 66 U/L (ref 38–126)
Anion gap: 9 (ref 5–15)
BUN: 18 mg/dL (ref 6–20)
CO2: 23 mmol/L (ref 22–32)
Calcium: 9 mg/dL (ref 8.9–10.3)
Chloride: 104 mmol/L (ref 98–111)
Creatinine, Ser: 0.73 mg/dL (ref 0.61–1.24)
GFR, Estimated: >60 mL/min (ref >60)
Glucose, Bld: 287 mg/dL — ABNORMAL HIGH (ref 70–99)
Potassium: 4.3 mmol/L (ref 3.5–5.1)
Sodium: 136 mmol/L (ref 135–145)
Total Bilirubin: 0.5 mg/dL (ref 0.0–1.2)
Total Protein: 7.3 g/dL (ref 6.5–8.1)

## 2023-07-18 NOTE — Progress Notes (Signed)
SATURATION QUALIFICATIONS: (This note is used to comply with regulatory documentation for home oxygen)  Patient Saturations on Room Air at Rest = 97%  Patient Saturations on Room Air while Ambulating = 95%  Please briefly explain why patient needs home oxygen: Pt does not qualify for home oxygen therapy. Pt maintained oxygen saturation above 95% while ambulating on room air.

## 2023-07-18 NOTE — Progress Notes (Signed)
PROGRESS NOTE    Jackson Hatfield  FAO:[id number] DOB: 24-May-1971 DOA: 07/16/2023 PCP: Esperanza Richters, PA-C   Brief Narrative:   Jackson Hatfield is a 53 y.o. male with medical history significant of bronchiectasis, sarcoidosis, diabetes type 2, GERD, hypertension, hyperlipidemia, BPH, obstructive sleep apnea on CPAP who was brought in from ER at [facility] with acute onset hypoxia after recent influenza diagnosis and treatment.  Patient also received concurrent Levaquin -antivirals and antibiotics initiated 07/12/2023.  Assessment & Plan:   Principal Problem:   Acute respiratory failure with hypoxia (HCC) Active Problems:   Sarcoidosis   OSA (obstructive sleep apnea)   Morbid obesity (HCC)   Bronchiectasis (HCC)  Acute respiratory failure with hypoxia:  Likely in the setting of recent influenza infection complicated by chronic bronchiectasis and sarcoidosis of the lung -Baseline patient is on room air able to ambulate moderate distances without profound dyspnea(upwards of 1/4 to 1/2 mile per discussion) -Continue to wean oxygen as tolerated, pulmonology consulted as below, appreciate insight recommendations -recommend to continue ICS, LABA given benefit in symptoms -Discontinue Tamiflu given patient has now completed therapy -Continue doxycycline for broad-spectrum coverage x5 days then discontinue -Continue steroids -may benefit from prolonged wean -Will plan for outpatient follow-up with pulmonology, discussed possible follow-up with [facility] transplant team as well in the future for further evaluation of lung transplant if he qualifies.  Sarcoidosis/Bronchiectasis:  -Chronic, pulmonology consulted -Patient indicates outpatient follow-up at [facility] but is unsatisfied by the lack of therapy options and no real improvement despite multiple visits -It appears he has established with our pulmonology group outpatient in the past-with [name] -He and wife initially requesting  referral for [facility] clinic, but upon discussion with pulmonology he would likely be better served locally -consider follow-up with outpatient [facility] transplant services  Obstructive sleep apnea: Continue CPAP at night.  Needs a repeat sleep study per his job -explained this cannot be done inpatient, follow-up outpatient pulmonology   Diabetes, type II, uncontrolled with hyperglycemia Continue diabetic diet, hypoglycemic protocol/sliding scale insulin Lab Results  Component Value Date   HGBA1C 7.3 (H) 07/15/2023     Morbid obesity: Dietary counseling paramount given patient's limited exercise tolerance in the setting of above -we did discuss GLP-1 inhibitors, he and wife are concerned for side effects Body mass index is 38.39 kg/m.  DVT prophylaxis: enoxaparin (LOVENOX) injection 40 mg Start: 07/17/23 1000   Code Status:   Code Status: Full Code  Family Communication: Wife at bedside  Status is: Inpatient  Dispo: The patient is from: Home              Anticipated d/c is to: Home              Anticipated d/c date is: 48 to 72 hours              Patient currently not medically stable for discharge  Consultants:  Pulmonology  Procedures:  None  Antimicrobials:  Doxycycline  Subjective: No acute issues or events overnight, respiratory status appears to be stabilizing, while not improving he has not improved either.  Otherwise denies chest pain nausea vomiting diarrhea constipation headache fevers chills.  Objective: Vitals:   07/17/23 1638 07/17/23 1928 07/17/23 2138 07/18/23 0347  BP:  (!) 142/75  124/80  Pulse:  76  (!) 51  Resp:  18  18  Temp:  97.8 F (36.6 C)  97.9 F (36.6 C)  TempSrc:  Oral  Oral  SpO2: 91% 95% 96%  96%  Weight:      Height:        Intake/Output Summary (Last 24 hours) at 07/18/2023 0735 Last data filed at 07/17/2023 1819 Gross per 24 hour  Intake 720 ml  Output 300 ml  Net 420 ml   Filed Weights   07/16/23 1056 07/17/23 0013  Weight:  135 kg 135.6 kg    Examination:  General:  Pleasantly resting in bed, No acute distress. HEENT:  Normocephalic atraumatic.  Sclerae nonicteric, noninjected.  Extraocular movements intact bilaterally. Neck:  Without mass or deformity.  Trachea is midline. Lungs: Diffuse bilateral rhonchi, diminished left lower lobe sounds. Heart:  Regular rate and rhythm.  Without murmurs, rubs, or gallops. Abdomen:  Soft, nontender, nondistended.  Without guarding or rebound. Extremities: Without cyanosis, clubbing, edema, or obvious deformity. Skin:  Warm and dry, no erythema.  Data Reviewed: I have personally reviewed following labs and imaging studies  CBC: Recent Labs  Lab 07/15/23 1543 07/16/23 1054 07/17/23 0526  WBC 3.5* 4.1 3.0*  NEUTROABS 1,505 2.4  --   HGB 13.1* 13.1 12.9*  HCT 39.1 38.4* 38.6*  MCV 84.8 83.7 85.6  PLT 238 229 197   Basic Metabolic Panel: Recent Labs  Lab 07/15/23 1543 07/16/23 1054 07/17/23 0526  NA 137 136 135  K 4.3 4.1 4.3  CL 101 102 104  CO2 27 26 23   GLUCOSE 117* 134* 210*  BUN 15 12 12   CREATININE 0.78 0.71 0.44*  CALCIUM 9.1 9.0 9.0   GFR: Estimated Creatinine Clearance: 154.7 mL/min (A) (by C-G formula based on SCr of 0.44 mg/dL (L)). Liver Function Tests: Recent Labs  Lab 07/15/23 1543 07/17/23 0526  AST 30 36  ALT 36 45*  ALKPHOS  --  63  BILITOT 0.4 0.7  PROT 6.8 7.2  ALBUMIN  --  3.9   HbA1C: Recent Labs    07/15/23 1543  HGBA1C 7.3*   CBG: Recent Labs  Lab 07/17/23 1108 07/17/23 1250 07/17/23 1637 07/17/23 2043 07/18/23 0728  GLUCAP 190* 204* 219* 217* 248*   Recent Results (from the past 240 hours)  Expectorated Sputum Assessment w Gram Stain, Rflx to Resp Cult     Status: None   Collection Time: 07/17/23  4:56 PM   Specimen: Sputum  Result Value Ref Range Status   Specimen Description SPUTUM  Final   Special Requests SPUTUM  Final   Sputum evaluation   Final    Sputum specimen not acceptable for testing.   Please recollect.   Performed at [facility], [address], [place], [place] [address]    Report Status 07/17/2023 FINAL  Final         Radiology Studies: CT Chest W Contrast Result Date: 07/16/2023 CLINICAL DATA:  53 year old male with a history of sarcoidosis. Productive cough. Hypoxia. Recently diagnosed with influenza. EXAM: CT CHEST WITH CONTRAST TECHNIQUE: Multidetector CT imaging of the chest was performed during intravenous contrast administration. RADIATION DOSE REDUCTION: This exam was performed according to the departmental dose-optimization program which includes automated exposure control, adjustment of the mA and/or kV according to patient size and/or use of iterative reconstruction technique. CONTRAST:  OMNIPAQUE IOHEXOL 300 MG/ML  SOLN COMPARISON:  Cardiac CT 03/17/2022. Chest radiographs yesterday. Chest CT 03/04/2022. FINDINGS: Cardiovascular: Stable heart size since 2023. No pericardial effusion. Minimal calcified atherosclerosis of the thoracic aorta. Mediastinum/Nodes: Chronic increased number of generally subcentimeter mediastinal lymph nodes, some faintly calcified (series 2, image 74), stable since 2023. The largest node  is near the left hilum, 1.5 cm short axis. Lungs/Pleura: Dependent retained secretions in the trachea on series 4, image 31. These continue into the low left mainstem bronchus. There is severe chronic left lower lobe bronchiectasis with associated more proximal chronic airway thickening. Associated architectural distortion in the left lingula and lower lobe, stable nodularity since 2023. Left apical lung scarring and architectural distortion on series 4, image 29 is also stable. Scattered additional bilateral pulmonary nodularity also has not significantly changed since 2023. Bronchiectasis in the medial segment of the right middle lobe is stable. Medial right lower lobe peribronchial patchy and nodular opacity is stable (series 4,  image 116). No pleural effusion. No areas of worsening ventilation. Upper Abdomen: Evidence of chronic hepatic steatosis. Visible liver, gallbladder, spleen, pancreas, adrenal glands, left kidney and bowel appears stable and negative. Low level upper abdominal lymphadenopathy (series 2, image 170) also appears stable. Musculoskeletal: No acute or suspicious osseous lesion. IMPRESSION: 1. Stable CT appearance of Thoracic Sarcoidosis since 2023. Associated chronic Bronchiectasis, architectural distortion maximal in the left lower lung. 2. Retained secretions in the trachea and left mainstem bronchus. But no evidence of acute pulmonary infection. No pleural effusion. Electronically Signed   By: Odessa Fleming M.D.   On: 07/16/2023 12:15        Scheduled Meds:  amLODipine  5 mg Oral Daily   atorvastatin  10 mg Oral Daily   doxycycline  100 mg Oral Q12H   enoxaparin (LOVENOX) injection  40 mg Subcutaneous Q24H   insulin aspart  0-20 Units Subcutaneous TID WC   insulin aspart  0-5 Units Subcutaneous QHS   losartan  100 mg Oral Daily   methylPREDNISolone sodium succinate  40 mg Intravenous Q24H   mometasone-formoterol  2 puff Inhalation BID   sodium chloride HYPERTONIC  4 mL Nebulization TID   tamsulosin  0.4 mg Oral Daily   Continuous Infusions:   LOS: 1 day   Time spent:  Azucena Fallen, DO [facility]  If 7PM-7AM, please contact night-coverage [email or web address]  07/18/2023, 7:35 AM

## 2023-07-18 NOTE — Plan of Care (Signed)

## 2023-07-19 DIAGNOSIS — J9601 Acute respiratory failure with hypoxia: Secondary | ICD-10-CM | POA: Diagnosis not present

## 2023-07-19 LAB — ANGIOTENSIN CONVERTING ENZYME: Angiotensin-Converting Enzyme: 36 U/L (ref 14–82)

## 2023-07-19 LAB — GLUCOSE, CAPILLARY: Glucose-Capillary: 282 mg/dL — ABNORMAL HIGH (ref 70–99)

## 2023-07-19 MED ORDER — POLYETHYLENE GLYCOL 3350 17 G PO PACK
17.0000 g | PACK | Freq: Every day | ORAL | Status: DC
  Administered 2023-07-19: 17 g via ORAL
  Filled 2023-07-19: qty 1

## 2023-07-19 MED ORDER — PREDNISONE 10 MG PO TABS: ORAL_TABLET | ORAL | 0 refills | Status: AC

## 2023-07-19 MED ORDER — DOXYCYCLINE HYCLATE 100 MG PO TABS: 100.0000 mg | ORAL_TABLET | Freq: Two times a day (BID) | ORAL | 0 refills | Status: DC

## 2023-07-19 MED ORDER — SENNOSIDES-DOCUSATE SODIUM 8.6-50 MG PO TABS
1.0000 | ORAL_TABLET | Freq: Once | ORAL | Status: AC
  Administered 2023-07-19: 1 via ORAL
  Filled 2023-07-19: qty 1

## 2023-07-19 MED ORDER — TRAZODONE HCL 50 MG PO TABS: 50.0000 mg | ORAL_TABLET | Freq: Every evening | ORAL | 0 refills | Status: AC | PRN

## 2023-07-19 NOTE — Plan of Care (Signed)
Problem: Education: Goal: Knowledge of General Education information will improve Description: Including pain rating scale, medication(s)/side effects and non-pharmacologic comfort measures Outcome: Progressing   Problem: Health Behavior/Discharge Planning: Goal: Ability to manage health-related needs will improve Outcome: Progressing   Problem: Clinical Measurements: Goal: Ability to maintain clinical measurements within normal limits will improve Outcome: Progressing Goal: Will remain free from infection Outcome: Progressing Goal: Diagnostic test results will improve Outcome: Progressing Goal: Respiratory complications will improve Outcome: Progressing Goal: Cardiovascular complication will be avoided Outcome: Progressing   Problem: Activity: Goal: Risk for activity intolerance will decrease Outcome: Progressing   Problem: Nutrition: Goal: Adequate nutrition will be maintained Outcome: Progressing   Problem: Coping: Goal: Level of anxiety will decrease Outcome: Progressing   Problem: Elimination: Goal: Will not experience complications related to bowel motility Outcome: Progressing Goal: Will not experience complications related to urinary retention Outcome: Progressing   Problem: Pain Managment: Goal: General experience of comfort will improve and/or be controlled Outcome: Progressing   Problem: Safety: Goal: Ability to remain free from injury will improve Outcome: Progressing   Problem: Skin Integrity: Goal: Risk for impaired skin integrity will decrease Outcome: Progressing   Problem: Education: Goal: Ability to describe self-care measures that may prevent or decrease complications (Diabetes Survival Skills Education) will improve Outcome: Progressing Goal: Individualized Educational Video(s) Outcome: Progressing   Problem: Coping: Goal: Ability to adjust to condition or change in health will improve Outcome: Progressing   Problem: Fluid  Volume: Goal: Ability to maintain a balanced intake and output will improve Outcome: Progressing   Problem: Health Behavior/Discharge Planning: Goal: Ability to identify and utilize available resources and services will improve Outcome: Progressing Goal: Ability to manage health-related needs will improve Outcome: Progressing   Problem: Metabolic: Goal: Ability to maintain appropriate glucose levels will improve Outcome: Progressing   Problem: Nutritional: Goal: Maintenance of adequate nutrition will improve Outcome: Progressing Goal: Progress toward achieving an optimal weight will improve Outcome: Progressing   Problem: Skin Integrity: Goal: Risk for impaired skin integrity will decrease Outcome: Progressing   Problem: Tissue Perfusion: Goal: Adequacy of tissue perfusion will improve Outcome: Progressing   Problem: Education: Goal: Ability to describe self-care measures that may prevent or decrease complications (Diabetes Survival Skills Education) will improve Outcome: Progressing Goal: Individualized Educational Video(s) Outcome: Progressing   Problem: Coping: Goal: Ability to adjust to condition or change in health will improve Outcome: Progressing   Problem: Fluid Volume: Goal: Ability to maintain a balanced intake and output will improve Outcome: Progressing   Problem: Health Behavior/Discharge Planning: Goal: Ability to identify and utilize available resources and services will improve Outcome: Progressing Goal: Ability to manage health-related needs will improve Outcome: Progressing   Problem: Metabolic: Goal: Ability to maintain appropriate glucose levels will improve Outcome: Progressing   Problem: Nutritional: Goal: Maintenance of adequate nutrition will improve Outcome: Progressing Goal: Progress toward achieving an optimal weight will improve Outcome: Progressing   Problem: Skin Integrity: Goal: Risk for impaired skin integrity will  decrease Outcome: Progressing   Problem: Tissue Perfusion: Goal: Adequacy of tissue perfusion will improve Outcome: Progressing   Problem: Education: Goal: Ability to describe self-care measures that may prevent or decrease complications (Diabetes Survival Skills Education) will improve Outcome: Progressing Goal: Individualized Educational Video(s) Outcome: Progressing   Problem: Coping: Goal: Ability to adjust to condition or change in health will improve Outcome: Progressing   Problem: Fluid Volume: Goal: Ability to maintain a balanced intake and output will improve Outcome: Progressing   Problem: Health Behavior/Discharge Planning:  Goal: Ability to identify and utilize available resources and services will improve Outcome: Progressing Goal: Ability to manage health-related needs will improve Outcome: Progressing   Problem: Metabolic: Goal: Ability to maintain appropriate glucose levels will improve Outcome: Progressing

## 2023-07-19 NOTE — Discharge Summary (Signed)
Physician Discharge Summary  Jackson Hatfield:[id number] DOB: 02/01/1971 DOA: 07/16/2023  PCP: Esperanza Richters, PA-C  Admit date: 07/16/2023 Discharge date: 07/19/2023  Admitted From: Home Disposition: Home  Recommendations for Outpatient Follow-up:  Follow up with PCP in 1-2 weeks Repeat A1C in 60-90 days to re-evaluate for diabetic medication needs given moderately well controlled glucose here on more strict diet despite steroids  Home Health: None Equipment/Devices: None  Discharge Condition: Stable CODE STATUS: Full Diet recommendation: Low-salt low-fat diabetic diet  Brief/Interim Summary: Jackson Hatfield is a 52 y.o. male with medical history significant of bronchiectasis, sarcoidosis, diabetes type 2, GERD, hypertension, hyperlipidemia, BPH, obstructive sleep apnea on CPAP who was brought in from ER at [facility] with acute onset hypoxia after recent influenza diagnosis and treatment.  Patient also received concurrent Levaquin -antivirals and antibiotics initiated 07/12/2023.  Patient mated as above with acute hypoxic respiratory failure in the setting of recent influenza pneumonia.  Exacerbated by his chronic bronchiectasis and sarcoidosis.  In the interim patient was placed on steroids with inhalers, PCCM/pulmonology was consulted given his chronic conditions.  Patient improving well now on room air able to ambulate without further hypoxia or profound symptoms.  While not back to "baseline" he does feel quite improved from prior.  We discussed that given his baseline lung function he may not feel well for another week to 2 weeks.  He does have planned outpatient follow-up with pulmonology here with [place] per their schedule.  Patient also follows at [facility] and has been discussed to follow at [facility] for possible lung transplant evaluation.  Medication changes as below otherwise patient stable and agreeable for discharge home   Discharge Diagnoses:  Principal Problem:   Acute  respiratory failure with hypoxia (HCC) Active Problems:   Sarcoidosis   OSA (obstructive sleep apnea)   Morbid obesity (HCC)   Bronchiectasis (HCC)  Acute respiratory failure with hypoxia:  Likely in the setting of recent influenza infection complicated by chronic bronchiectasis and sarcoidosis of the lung   Sarcoidosis/Bronchiectasis:  To follow with [place] pulmonology outpatient   Obstructive sleep apnea: Continue CPAP  Diabetes, type II, uncontrolled with hyperglycemia Continue diabetic diet Discussed weight loss/GLP1 as below Would recommend repeat A1C in 60-90 days to re-evaluate for diabetic medication needs given moderately well controlled glucose here on more strict diet despite steroids    Morbid obesity: Dietary counseling paramount given patient's limited exercise tolerance in the setting of above -we did discuss GLP-1 inhibitors, he and wife are concerned for side effects Body mass index is 38.39 kg/m.  Discharge Instructions  Discharge Instructions     Call MD for:  difficulty breathing, headache or visual disturbances   Complete by: As directed    Call MD for:  extreme fatigue   Complete by: As directed    Call MD for:  hives   Complete by: As directed    Call MD for:  persistant dizziness or light-headedness   Complete by: As directed    Call MD for:  persistant nausea and vomiting   Complete by: As directed    Call MD for:  severe uncontrolled pain   Complete by: As directed    Call MD for:  temperature >100.4   Complete by: As directed    Diet - low sodium heart healthy   Complete by: As directed    Discharge instructions   Complete by: As directed    Follow up with pulmonology and PCP as discussed.  Increase activity slowly   Complete by: As directed       Allergies as of 07/19/2023       Reactions   Naproxen Hives   Penicillin G         Medication List     STOP taking these medications    levofloxacin 750 MG tablet Commonly known  as: LEVAQUIN   oseltamivir 75 MG capsule Commonly known as: TAMIFLU   sildenafil 20 MG tablet Commonly known as: REVATIO       TAKE these medications    albuterol (2.5 MG/3ML) 0.083% nebulizer solution Commonly known as: PROVENTIL Take 3 mLs (2.5 mg total) by nebulization 3 (three) times daily as needed for wheezing or shortness of breath.   albuterol 108 (90 Base) MCG/ACT inhaler Commonly known as: VENTOLIN HFA Inhale 2 puffs into the lungs every 4 (four) hours as needed for wheezing or shortness of breath.   amLODipine 5 MG tablet Commonly known as: NORVASC Take 1 tablet (5 mg total) by mouth daily.   atorvastatin 10 MG tablet Commonly known as: LIPITOR TAKE 1 TABLET BY MOUTH EVERY DAY   budesonide-formoterol 160-4.5 MCG/ACT inhaler Commonly known as: SYMBICORT Inhale 2 puffs into the lungs 2 (two) times daily.   doxycycline 100 MG tablet Commonly known as: VIBRA-TABS Take 1 tablet (100 mg total) by mouth every 12 (twelve) hours.   HYDROcodone bit-homatropine 5-1.5 MG/5ML syrup Commonly known as: HYCODAN Take 5 mLs by mouth every 8 (eight) hours as needed for cough.   losartan 100 MG tablet Commonly known as: COZAAR Take 1 tablet (100 mg total) by mouth daily.   MENS ONE DAILY PO Take 1 tablet by mouth daily.   metFORMIN 500 MG tablet Commonly known as: GLUCOPHAGE Take 1 tablet (500 mg total) by mouth 2 (two) times daily with a meal.   predniSONE 10 MG tablet Commonly known as: DELTASONE Take 4 tablets (40 mg total) by mouth daily for 5 days, THEN 3 tablets (30 mg total) daily for 5 days, THEN 2 tablets (20 mg total) daily for 5 days, THEN 1 tablet (10 mg total) daily for 5 days. Start taking on: July 19, 2023   sodium chloride HYPERTONIC 3 % nebulizer solution Take by nebulization 3 (three) times daily. Use flutter valve after each nebulizer use   tadalafil 5 MG tablet Commonly known as: CIALIS Take 1 tablet (5 mg total) by mouth daily as needed for  erectile dysfunction.   tamsulosin 0.4 MG Caps capsule Commonly known as: FLOMAX Take 1 capsule (0.4 mg total) by mouth in the morning and at bedtime.        Allergies  Allergen Reactions   Naproxen Hives   Penicillin G     Consultations: PCCM/pulmonology  Procedures/Studies: CT Chest W Contrast Result Date: 07/16/2023 CLINICAL DATA:  53 year old male with a history of sarcoidosis. Productive cough. Hypoxia. Recently diagnosed with influenza. EXAM: CT CHEST WITH CONTRAST TECHNIQUE: Multidetector CT imaging of the chest was performed during intravenous contrast administration. RADIATION DOSE REDUCTION: This exam was performed according to the departmental dose-optimization program which includes automated exposure control, adjustment of the mA and/or kV according to patient size and/or use of iterative reconstruction technique. CONTRAST:  OMNIPAQUE IOHEXOL 300 MG/ML  SOLN COMPARISON:  Cardiac CT 03/17/2022. Chest radiographs yesterday. Chest CT 03/04/2022. FINDINGS: Cardiovascular: Stable heart size since 2023. No pericardial effusion. Minimal calcified atherosclerosis of the thoracic aorta. Mediastinum/Nodes: Chronic increased number of generally subcentimeter mediastinal lymph nodes, some faintly calcified (series  2, image 74), stable since 2023. The largest node is near the left hilum, 1.5 cm short axis. Lungs/Pleura: Dependent retained secretions in the trachea on series 4, image 31. These continue into the low left mainstem bronchus. There is severe chronic left lower lobe bronchiectasis with associated more proximal chronic airway thickening. Associated architectural distortion in the left lingula and lower lobe, stable nodularity since 2023. Left apical lung scarring and architectural distortion on series 4, image 29 is also stable. Scattered additional bilateral pulmonary nodularity also has not significantly changed since 2023. Bronchiectasis in the medial segment of the right  middle lobe is stable. Medial right lower lobe peribronchial patchy and nodular opacity is stable (series 4, image 116). No pleural effusion. No areas of worsening ventilation. Upper Abdomen: Evidence of chronic hepatic steatosis. Visible liver, gallbladder, spleen, pancreas, adrenal glands, left kidney and bowel appears stable and negative. Low level upper abdominal lymphadenopathy (series 2, image 170) also appears stable. Musculoskeletal: No acute or suspicious osseous lesion. IMPRESSION: 1. Stable CT appearance of Thoracic Sarcoidosis since 2023. Associated chronic Bronchiectasis, architectural distortion maximal in the left lower lung. 2. Retained secretions in the trachea and left mainstem bronchus. But no evidence of acute pulmonary infection. No pleural effusion. Electronically Signed   By: Odessa Fleming M.D.   On: 07/16/2023 12:15   DG Chest 2 View Result Date: 07/15/2023 CLINICAL DATA:  History of sarcoid productive cough EXAM: CHEST - 2 VIEW COMPARISON:  04/11/2022, CT chest 03/04/2022, chest x-ray 04/04/2021 FINDINGS: Similar appearance of the lingula and left base compared to prior radiograph, likely corresponds to bronchiectasis and scarring seen on prior chest CT. Chronic volume loss left thorax corresponding to history of left lower lobectomy. No definite acute superimposed airspace disease compared to prior. Stable cardiomediastinal silhouette. No pneumothorax IMPRESSION: Similar appearance of volume loss on the left with chronic scarring and bronchiectasis in the left lower lung. No definite acute superimposed airspace disease compared to prior radiograph. Electronically Signed   By: Jasmine Pang M.D.   On: 07/15/2023 16:25     Subjective: No acute issues or events overnight respiratory status improving drastically over the past 48 hours   Discharge Exam: Vitals:   07/19/23 0854 07/19/23 1047  BP:    Pulse:    Resp:    Temp:    SpO2: 96% 92%   Vitals:   07/18/23 2203 07/19/23 0526  07/19/23 0854 07/19/23 1047  BP:  131/68    Pulse:  72    Resp:  18    Temp:  97.8 F (36.6 C)    TempSrc:  Oral    SpO2: 98% 95% 96% 92%  Weight:      Height:        General: Pt is alert, awake, not in acute distress Cardiovascular: RRR, S1/S2 +, no rubs, no gallops Respiratory: CTA bilaterally, no wheezing, no rhonchi Abdominal: Soft, NT, ND, bowel sounds + Extremities: no edema, no cyanosis    The results of significant diagnostics from this hospitalization (including imaging, microbiology, ancillary and laboratory) are listed below for reference.     Microbiology: Recent Results (from the past 240 hours)  Expectorated Sputum Assessment w Gram Stain, Rflx to Resp Cult     Status: None   Collection Time: 07/17/23  4:56 PM   Specimen: Sputum  Result Value Ref Range Status   Specimen Description SPUTUM  Final   Special Requests SPUTUM  Final   Sputum evaluation   Final    Sputum specimen  not acceptable for testing.  Please recollect.   Performed at [facility], [address], [place], [place] [address]    Report Status 07/17/2023 FINAL  Final     Labs: BNP (last 3 results) Recent Labs    07/16/23 1054  BNP 52.1   Basic Metabolic Panel: Recent Labs  Lab 07/15/23 1543 07/16/23 1054 07/17/23 0526 07/18/23 0747  NA 137 136 135 136  K 4.3 4.1 4.3 4.3  CL 101 102 104 104  CO2 27 26 23 23   GLUCOSE 117* 134* 210* 287*  BUN 15 12 12 18   CREATININE 0.78 0.71 0.44* 0.73  CALCIUM 9.1 9.0 9.0 9.0   Liver Function Tests: Recent Labs  Lab 07/15/23 1543 07/17/23 0526 07/18/23 0747  AST 30 36 26  ALT 36 45* 43  ALKPHOS  --  63 66  BILITOT 0.4 0.7 0.5  PROT 6.8 7.2 7.3  ALBUMIN  --  3.9 3.7   No results for input(s): "LIPASE", "AMYLASE" in the last 168 hours. No results for input(s): "AMMONIA" in the last 168 hours. CBC: Recent Labs  Lab 07/15/23 1543 07/16/23 1054 07/17/23 0526 07/18/23 0747  WBC 3.5* 4.1 3.0* 8.2  NEUTROABS  1,505 2.4  --   --   HGB 13.1* 13.1 12.9* 13.2  HCT 39.1 38.4* 38.6* 39.2  MCV 84.8 83.7 85.6 85.0  PLT 238 229 197 264   Cardiac Enzymes: No results for input(s): "CKTOTAL", "CKMB", "CKMBINDEX", "TROPONINI" in the last 168 hours. BNP: Invalid input(s): "POCBNP" CBG: Recent Labs  Lab 07/18/23 0728 07/18/23 1126 07/18/23 1638 07/18/23 2131 07/19/23 0735  GLUCAP 248* 280* 171* 189* 282*   D-Dimer No results for input(s): "DDIMER" in the last 72 hours. Hgb A1c No results for input(s): "HGBA1C" in the last 72 hours. Lipid Profile No results for input(s): "CHOL", "HDL", "LDLCALC", "TRIG", "CHOLHDL", "LDLDIRECT" in the last 72 hours. Thyroid function studies No results for input(s): "TSH", "T4TOTAL", "T3FREE", "THYROIDAB" in the last 72 hours.  Invalid input(s): "FREET3" Anemia work up No results for input(s): "VITAMINB12", "FOLATE", "FERRITIN", "TIBC", "IRON", "RETICCTPCT" in the last 72 hours. Urinalysis    Component Value Date/Time   COLORURINE YELLOW 03/31/2021 1627   APPEARANCEUR Clear 12/29/2022 0904   LABSPEC 1.010 03/31/2021 1627   PHURINE 6.0 03/31/2021 1627   GLUCOSEU Negative 12/29/2022 0904   HGBUR NEGATIVE 03/31/2021 1627   BILIRUBINUR Negative 12/29/2022 0904   KETONESUR NEGATIVE 03/31/2021 1627   PROTEINUR Negative 12/29/2022 0904   PROTEINUR NEGATIVE 03/31/2021 1627   UROBILINOGEN negative (A) 08/03/2018 0954   NITRITE Negative 12/29/2022 0904   NITRITE NEGATIVE 03/31/2021 1627   LEUKOCYTESUR Negative 12/29/2022 0904   LEUKOCYTESUR NEGATIVE 03/31/2021 1627   Sepsis Labs Recent Labs  Lab 07/15/23 1543 07/16/23 1054 07/17/23 0526 07/18/23 0747  WBC 3.5* 4.1 3.0* 8.2   Microbiology Recent Results (from the past 240 hours)  Expectorated Sputum Assessment w Gram Stain, Rflx to Resp Cult     Status: None   Collection Time: 07/17/23  4:56 PM   Specimen: Sputum  Result Value Ref Range Status   Specimen Description SPUTUM  Final   Special Requests  SPUTUM  Final   Sputum evaluation   Final    Sputum specimen not acceptable for testing.  Please recollect.   Performed at [facility], [address] [place], [place] [address]    Report Status 07/17/2023 FINAL  Final     Time coordinating discharge: Over 30 minutes  SIGNED:  Azucena Fallen, DO [facility] 07/19/2023, 10:54 AM Pager   If 7PM-7AM, please contact night-coverage [email or web address]

## 2023-07-19 NOTE — Plan of Care (Addendum)
Patient discharged home, PIV removed, all questions, upcoming appointments, medications explained to patient. No further needs.  Problem: Education: Goal: Knowledge of General Education information will improve Description: Including pain rating scale, medication(s)/side effects and non-pharmacologic comfort measures Outcome: Adequate for Discharge   Problem: Health Behavior/Discharge Planning: Goal: Ability to manage health-related needs will improve Outcome: Adequate for Discharge   Problem: Clinical Measurements: Goal: Ability to maintain clinical measurements within normal limits will improve Outcome: Adequate for Discharge Goal: Will remain free from infection Outcome: Adequate for Discharge Goal: Diagnostic test results will improve Outcome: Adequate for Discharge Goal: Respiratory complications will improve Outcome: Adequate for Discharge Goal: Cardiovascular complication will be avoided Outcome: Adequate for Discharge   Problem: Activity: Goal: Risk for activity intolerance will decrease Outcome: Adequate for Discharge   Problem: Nutrition: Goal: Adequate nutrition will be maintained Outcome: Adequate for Discharge   Problem: Coping: Goal: Level of anxiety will decrease Outcome: Adequate for Discharge   Problem: Elimination: Goal: Will not experience complications related to bowel motility Outcome: Adequate for Discharge Goal: Will not experience complications related to urinary retention Outcome: Adequate for Discharge   Problem: Pain Managment: Goal: General experience of comfort will improve and/or be controlled Outcome: Adequate for Discharge   Problem: Safety: Goal: Ability to remain free from injury will improve Outcome: Adequate for Discharge   Problem: Skin Integrity: Goal: Risk for impaired skin integrity will decrease Outcome: Adequate for Discharge   Problem: Education: Goal: Ability to describe self-care measures that may prevent or decrease  complications (Diabetes Survival Skills Education) will improve Outcome: Adequate for Discharge Goal: Individualized Educational Video(s) Outcome: Adequate for Discharge   Problem: Coping: Goal: Ability to adjust to condition or change in health will improve Outcome: Adequate for Discharge   Problem: Fluid Volume: Goal: Ability to maintain a balanced intake and output will improve Outcome: Adequate for Discharge   Problem: Health Behavior/Discharge Planning: Goal: Ability to identify and utilize available resources and services will improve Outcome: Adequate for Discharge Goal: Ability to manage health-related needs will improve Outcome: Adequate for Discharge   Problem: Metabolic: Goal: Ability to maintain appropriate glucose levels will improve Outcome: Adequate for Discharge   Problem: Nutritional: Goal: Maintenance of adequate nutrition will improve Outcome: Adequate for Discharge Goal: Progress toward achieving an optimal weight will improve Outcome: Adequate for Discharge   Problem: Skin Integrity: Goal: Risk for impaired skin integrity will decrease Outcome: Adequate for Discharge   Problem: Tissue Perfusion: Goal: Adequacy of tissue perfusion will improve Outcome: Adequate for Discharge   Problem: Education: Goal: Ability to describe self-care measures that may prevent or decrease complications (Diabetes Survival Skills Education) will improve Outcome: Adequate for Discharge Goal: Individualized Educational Video(s) Outcome: Adequate for Discharge   Problem: Coping: Goal: Ability to adjust to condition or change in health will improve Outcome: Adequate for Discharge   Problem: Fluid Volume: Goal: Ability to maintain a balanced intake and output will improve Outcome: Adequate for Discharge   Problem: Health Behavior/Discharge Planning: Goal: Ability to identify and utilize available resources and services will improve Outcome: Adequate for Discharge Goal:  Ability to manage health-related needs will improve Outcome: Adequate for Discharge   Problem: Metabolic: Goal: Ability to maintain appropriate glucose levels will improve Outcome: Adequate for Discharge   Problem: Nutritional: Goal: Maintenance of adequate nutrition will improve Outcome: Adequate for Discharge Goal: Progress toward achieving an optimal weight will improve Outcome: Adequate for Discharge   Problem: Skin Integrity: Goal: Risk for impaired skin integrity will decrease Outcome:  Adequate for Discharge   Problem: Tissue Perfusion: Goal: Adequacy of tissue perfusion will improve Outcome: Adequate for Discharge   Problem: Education: Goal: Ability to describe self-care measures that may prevent or decrease complications (Diabetes Survival Skills Education) will improve Outcome: Adequate for Discharge Goal: Individualized Educational Video(s) Outcome: Adequate for Discharge   Problem: Coping: Goal: Ability to adjust to condition or change in health will improve Outcome: Adequate for Discharge   Problem: Fluid Volume: Goal: Ability to maintain a balanced intake and output will improve Outcome: Adequate for Discharge   Problem: Health Behavior/Discharge Planning: Goal: Ability to identify and utilize available resources and services will improve Outcome: Adequate for Discharge Goal: Ability to manage health-related needs will improve Outcome: Adequate for Discharge   Problem: Metabolic: Goal: Ability to maintain appropriate glucose levels will improve Outcome: Adequate for Discharge

## 2023-07-20 ENCOUNTER — Telehealth: Payer: Self-pay

## 2023-07-20 NOTE — Telephone Encounter (Signed)
Called PT. He really wants to see Dr. Celine Mans for HFU . She is n/a until 4/1. No one, except [facility], has anything until late march.   Dr. Celine Mans & [name] do I have your permission to turn PT to Dr. Celine Mans?  Can we open up appt for him in the 2-3 weeks you are requesting. TY.

## 2023-07-20 NOTE — Transitions of Care (Post Inpatient/ED Visit) (Deleted)
   07/20/2023  Name: Jackson Hatfield MRN: [id number] DOB: [birth year]  {[id number]}

## 2023-07-20 NOTE — Transitions of Care (Post Inpatient/ED Visit) (Signed)
   07/20/2023  Name: Jackson Hatfield MRN: [id number] DOB: 11/18/1970  Today's TOC FU Call Status: Today's TOC FU Call Status:: Unsuccessful Call (1st Attempt) Unsuccessful Call (1st Attempt) Date: 07/20/23  Attempted to reach the patient regarding the most recent Inpatient/ED visit.  Follow Up Plan: Additional outreach attempts will be made to reach the patient to complete the Transitions of Care (Post Inpatient/ED visit) call.    Orella Cushman Daphine Deutscher BSN, Programmer, systems / Transitions of Care [place] [facility], [facility] Direct Dial Number:  [phone]

## 2023-07-21 ENCOUNTER — Inpatient Hospital Stay: Payer: BC Managed Care – PPO | Admitting: Medical

## 2023-07-21 ENCOUNTER — Telehealth: Payer: Self-pay | Admitting: *Deleted

## 2023-07-21 NOTE — Progress Notes (Signed)
 Patient did not show for appointment.

## 2023-07-21 NOTE — Transitions of Care (Post Inpatient/ED Visit) (Signed)
07/21/2023  Name: Jackson Hatfield MRN: [id number] DOB: 03-19-1971  Today's TOC FU Call Status: Today's TOC FU Call Status:: Successful TOC FU Call Completed TOC FU Call Complete Date: 07/21/23 Patient's Name and Date of Birth confirmed.  Transition Care Management Follow-up Telephone Call Date of Discharge: 07/19/23 Discharge Facility: [facility]) Type of Discharge: Inpatient Admission Primary Inpatient Discharge Diagnosis:: Sarcoidosis; Acute respiratory Failure post- recent influenza diagnosis How have you been since you were released from the hospital?: Better ("I am doing better; taking the prednisone like they told me to; I was able to get on my [address] [employer] and take it down the street a little bit ago, so I think I am doing fine.  My fiancee is here looking out for me") Any questions or concerns?: No  Items Reviewed: Did you receive and understand the discharge instructions provided?: Yes (thoroughly reviewed with patient who verbalizes good understanding of same) Medications obtained,verified, and reconciled?: Yes (Medications Reviewed) (Full medication reconciliation/ review completed; no concerns or discrepancies identified; confirmed patient obtained/ is taking all newly Rx'd medications as instructed; self-manages medications and denies questions/ concerns around medications today) Any new allergies since your discharge?: No Dietary orders reviewed?: Yes Type of Diet Ordered:: "Pretty Healthy" Do you have support at home?: Yes People in Home: alone Name of Support/Comfort Primary Source: Reports resides alone/ independent in self-care activities; supportive fiancee (lives in [place]) assists as/ if needed/ indicated  Medications Reviewed Today: Medications Reviewed Today     Reviewed by Michaela Corner, RN (Registered Nurse) on 07/21/23 at 1521  Med List Status: <None>   Medication Order Taking? Sig Documenting Provider Last Dose Status Informant  albuterol (PROVENTIL) (2.5  MG/3ML) 0.083% nebulizer solution [id number] Yes Take 3 mLs (2.5 mg total) by nebulization 3 (three) times daily as needed for wheezing or shortness of breath. Martina Sinner, MD Taking Active Self           Med Note Mayford Knife, DAWN S   Thu Oct 10, 2021  6:45 PM)    albuterol (VENTOLIN HFA) 108 [phone] Base) MCG/ACT inhaler [id number] Yes Inhale 2 puffs into the lungs every 4 (four) hours as needed for wheezing or shortness of breath. [name], Ramon Dredge, PA-C Taking Active Self  amLODipine (NORVASC) 5 MG tablet [id number] Yes Take 1 tablet (5 mg total) by mouth daily. [name], Ramon Dredge, PA-C Taking Active Self  atorvastatin (LIPITOR) 10 MG tablet [id number] Yes TAKE 1 TABLET BY MOUTH EVERY DAY [name], Ramon Dredge, PA-C Taking Active Self  budesonide-formoterol (SYMBICORT) 160-4.5 MCG/ACT inhaler [id number] No Inhale 2 puffs into the lungs 2 (two) times daily.  Patient not taking: Reported on 07/21/2023   Esperanza Richters, PA-C Not Taking Active Self           Med Note Merlene Morse, Kathreen Cosier   Thu Jul 16, 2023 12:47 PM) Haven't picked up yet  doxycycline (VIBRA-TABS) 100 MG tablet [id number] Yes Take 1 tablet (100 mg total) by mouth every 12 (twelve) hours. Azucena Fallen, MD Taking Active            Med Note Michaela Corner   Tue Jul 21, 2023  3:17 PM) 07/21/23: Reports during [facility] call--  completed course as prescribed x 3 days  HYDROcodone bit-homatropine (HYCODAN) 5-1.5 MG/5ML syrup [id number] No Take 5 mLs by mouth every 8 (eight) hours as needed for cough.  Patient not taking: Reported on 07/21/2023   Esperanza Richters, PA-C Not Taking Active Self  Med Note Merlene Morse, KIMBERLY L   Thu Jul 16, 2023 12:48 PM) Haven't picked up yet  losartan (COZAAR) 100 MG tablet [id number] Yes Take 1 tablet (100 mg total) by mouth daily. [name], Ramon Dredge, PA-C Taking Active Self  metFORMIN (GLUCOPHAGE) 500 MG tablet [id number] Yes Take 1 tablet (500 mg total) by mouth 2 (two) times daily with a meal. Esperanza Richters,  PA-C Taking Active Self           Med Note Michaela Corner   Tue Jul 21, 2023  3:18 PM) 07/21/23- Reports during TOC call, taking 1000 mg q am  Multiple Vitamins-Minerals (MENS ONE DAILY PO) [id number] Yes Take 1 tablet by mouth daily.  [provider] Taking Active Self  predniSONE (DELTASONE) 10 MG tablet [id number] Yes Take 4 tablets (40 mg total) by mouth daily for 5 days, THEN 3 tablets (30 mg total) daily for 5 days, THEN 2 tablets (20 mg total) daily for 5 days, THEN 1 tablet (10 mg total) daily for 5 days. Azucena Fallen, MD Taking Active   sodium chloride HYPERTONIC 3 % nebulizer solution [id number] Yes Take by nebulization 3 (three) times daily. Use flutter valve after each nebulizer use Martina Sinner, MD Taking Active Self  tadalafil (CIALIS) 5 MG tablet [id number] Yes Take 1 tablet (5 mg total) by mouth daily as needed for erectile dysfunction. Marcine Matar, MD Taking Active Self           Med Note Merlene Morse, [facility]   Thu Jul 16, 2023 12:50 PM) prn  tamsulosin (FLOMAX) 0.4 MG CAPS capsule [id number] Yes Take 1 capsule (0.4 mg total) by mouth in the morning and at bedtime. Donnita Falls, FNP Taking Active Self  traZODone (DESYREL) 50 MG tablet [id number] Yes Take 1 tablet (50 mg total) by mouth at bedtime as needed for sleep. Azucena Fallen, MD Taking Active            Home Care and Equipment/Supplies: Were Home Health Services Ordered?: No Any new equipment or medical supplies ordered?: No  Functional Questionnaire: Do you need assistance with bathing/showering or dressing?: No Do you need assistance with meal preparation?: No Do you need assistance with eating?: No Do you have difficulty maintaining continence: No Do you need assistance with getting out of bed/getting out of a chair/moving?: No Do you have difficulty managing or taking your medications?: No  Follow up appointments reviewed: PCP Follow-up appointment confirmed?: Yes Date of  PCP follow-up appointment?: 07/31/23 Follow-up Provider: PCP Specialist Hospital Follow-up appointment confirmed?: NA Do you need transportation to your follow-up appointment?: No Do you understand care options if your condition(s) worsen?: Yes-patient verbalized understanding  SDOH Interventions Today    Flowsheet Row Most Recent Value  SDOH Interventions   Food Insecurity Interventions Intervention Not Indicated  [Patient denies food insecurity during TOC call today]  Housing Interventions Intervention Not Indicated  [Patient denies housing concerns during [facility] call today- 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, industrial/productmay need" resources "in the future" IF he "ends up going on disability, " advised patient to contact PCP for LCSW referral if he has resource needs in the future]  Transportation Interventions Intervention Not Indicated  Utilities Interventions Intervention Not Indicated      Interventions Today    Flowsheet Row Most Recent Value  Chronic Disease   Chronic disease during today's visit Other  [Acute Respiratory Failure secondary to recent influenza diagnosis]  General Interventions   General Interventions Discussed/Reviewed General Interventions Discussed,  Doctor Visits, Durable Medical Equipment (DME)  Doctor Visits Discussed/Reviewed PCP  Durable Medical Equipment (DME) --  Edilia Bo not currently requiring/ using assistive devices for ambulation]  PCP/Specialist Visits Compliance with follow-up visit  Exercise Interventions   Exercise Discussed/Reviewed Exercise Discussed  [Provided education around benefit of conservative post-hospital discharge activity,  need to pace activity without over-doing]  Pharmacy Interventions   Pharmacy Dicussed/Reviewed Pharmacy Topics Discussed  [Full medication review with updating medication list in EHR per patient report]       TOC Interventions Today    Flowsheet Row Most Recent Value  TOC Interventions   TOC Interventions  Discussed/Reviewed TOC Interventions Discussed  [Patient declines need for ongoing/ further care management/ coordination outreach,  declines enrollment in 30-day TOC program- declines taking my direct phone number should needs/ concerns arise post-TOC call]      Caryl Pina, RN, BSN, CCRN Alumnus RN Care Manager  Transitions of Care  [facility] - Population Health  [place] [phone]: direct office

## 2023-07-21 NOTE — Telephone Encounter (Signed)
Ok with me for patient to be seen by Dr. Celine Mans.  JD

## 2023-07-22 NOTE — Telephone Encounter (Signed)
Made appt

## 2023-07-26 ENCOUNTER — Other Ambulatory Visit: Payer: Self-pay | Admitting: Medical

## 2023-07-27 ENCOUNTER — Telehealth: Payer: Self-pay

## 2023-07-27 MED ORDER — ALBUTEROL SULFATE (2.5 MG/3ML) 0.083% IN NEBU: 2.5000 mg | INHALATION_SOLUTION | Freq: Three times a day (TID) | RESPIRATORY_TRACT | 5 refills | Status: AC | PRN

## 2023-07-27 NOTE — Telephone Encounter (Signed)
Copied from CRM [id number]. Topic: Clinical - Prescription Issue >> Jul 27, 2023  8:34 AM Kathryne Eriksson wrote: Reason for CRM: albuterol (PROVENTIL) (2.5 MG/3ML) 0.083% nebulizer solution >> Jul 27, 2023  8:41 AM Kathryne Eriksson wrote: Patient states he went to pick up his prescription albuterol (PROVENTIL) (2.5 MG/3ML) 0.083% nebulizer solution , but instead he received the inhaler and not the solution. Patient is requesting to have this resent to the pharmacy with a 30 day supply (if possible), states he will be traveling and wants to make sure he has enough.

## 2023-07-27 NOTE — Telephone Encounter (Signed)
 Rx sent

## 2023-07-28 ENCOUNTER — Ambulatory Visit: Payer: BC Managed Care – PPO | Admitting: Urology

## 2023-07-28 DIAGNOSIS — R059 Cough, unspecified: Secondary | ICD-10-CM | POA: Diagnosis not present

## 2023-07-28 DIAGNOSIS — R3914 Feeling of incomplete bladder emptying: Secondary | ICD-10-CM

## 2023-07-28 DIAGNOSIS — R030 Elevated blood-pressure reading, without diagnosis of hypertension: Secondary | ICD-10-CM | POA: Diagnosis not present

## 2023-07-28 DIAGNOSIS — N401 Enlarged prostate with lower urinary tract symptoms: Secondary | ICD-10-CM

## 2023-07-28 DIAGNOSIS — N529 Male erectile dysfunction, unspecified: Secondary | ICD-10-CM

## 2023-07-28 DIAGNOSIS — J189 Pneumonia, unspecified organism: Secondary | ICD-10-CM | POA: Diagnosis not present

## 2023-07-28 DIAGNOSIS — R509 Fever, unspecified: Secondary | ICD-10-CM | POA: Diagnosis not present

## 2023-07-28 DIAGNOSIS — N398 Other specified disorders of urinary system: Secondary | ICD-10-CM

## 2023-07-29 ENCOUNTER — Telehealth: Payer: Self-pay | Admitting: Medical

## 2023-07-29 ENCOUNTER — Telehealth: Payer: Self-pay

## 2023-07-29 NOTE — Telephone Encounter (Signed)
 Please advise Copied from CRM [id number]. Topic: Clinical - Medical Advice >> Jul 28, 2023  4:33 PM Tiffany H wrote: Reason for CRM: Patient called to advise that he was diagnosed with pneumonia again at [facility] today.   Patient advised that he'll be dropping off STD paperwork tomorrow as he has no income coming in. Please advise.

## 2023-07-29 NOTE — Telephone Encounter (Signed)
 Pt dropped off fmla papers to be completed by pcp. Pt has appointment on Friday and would like to get done then. Placed in pcp's tray in front office.

## 2023-07-30 NOTE — Telephone Encounter (Signed)
 noted

## 2023-07-31 ENCOUNTER — Ambulatory Visit: Payer: BC Managed Care – PPO | Admitting: Medical

## 2023-07-31 VITALS — BP 122/64 | HR 76 | Resp 18 | Ht 74.0 in | Wt 293.0 lb

## 2023-07-31 DIAGNOSIS — J470 Bronchiectasis with acute lower respiratory infection: Secondary | ICD-10-CM

## 2023-07-31 DIAGNOSIS — J189 Pneumonia, unspecified organism: Secondary | ICD-10-CM

## 2023-07-31 DIAGNOSIS — J9601 Acute respiratory failure with hypoxia: Secondary | ICD-10-CM | POA: Diagnosis not present

## 2023-07-31 DIAGNOSIS — D869 Sarcoidosis, unspecified: Secondary | ICD-10-CM | POA: Diagnosis not present

## 2023-07-31 DIAGNOSIS — R0600 Dyspnea, unspecified: Secondary | ICD-10-CM

## 2023-07-31 DIAGNOSIS — R062 Wheezing: Secondary | ICD-10-CM

## 2023-07-31 MED ORDER — BUDESONIDE-FORMOTEROL FUMARATE 160-4.5 MCG/ACT IN AERO: 2.0000 | INHALATION_SPRAY | Freq: Two times a day (BID) | RESPIRATORY_TRACT | 3 refills | Status: DC

## 2023-07-31 NOTE — Patient Instructions (Signed)
 Recurrent Pneumonia Recent hospitalization for acute respiratory failure and hypoxia secondary to influenza pneumonia. Subsequent development of pneumonia a week later or so after dc.. Currently on doxycycline  and levofloxacin . -Continue doxycycline  and levofloxacin  as prescribed. -Repeat chest x-ray next Thursday or Friday to assess response to treatment.  Bronchiectasis and Sarcoidosis Chronic lung conditions likely contributing to recurrent pneumonia. Currently on albuterol  nebulizer treatments three times a day and Symbicort . -Continue albuterol  nebulizer treatments and Symbicort . -Continue current antibiotics -Follow-up with pulmonologist on Tuesday.  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 -Complete short-term disability paperwork for patient.   Follow up date to be determined after pulmonologist visit and repeat chest xray

## 2023-07-31 NOTE — Progress Notes (Signed)
 Subjective:    Patient ID: Jackson Hatfield, male    DOB: [birth year], 53 y.o.   MRN: [id number]  HPI  Pt in for follow up post hospitalization.  Discussed the use of AI scribe software for clinical note transcription with the patient, who gave verbal consent to proceed.  History of Present Illness   Jackson Hatfield is a 53 year old male with bronchiectasis and sarcoidosis who presents with recurrent pneumonia and respiratory symptoms.  He was hospitalized on July 16, 2023, for acute respiratory failure and hypoxia, exacerbated by his underlying bronchiectasis and sarcoidosis. During this hospitalization, he was diagnosed with influenza pneumonia and treated with oxygen, prednisone  and antibiotics. After completing the initial course of treatment, he ran out of medication and subsequently developed pneumonia again.  Approximately one week after completing his initial treatment, he began experiencing congestion, a productive cough, and shortness of breath. On the past Sunday, he experienced chest congestion that woke him up at 3:30 AM, primarily affecting his left lung, accompanied by wheezing and shortness of breath. By Monday, he was expectorating significant amounts of sputum, and by Tuesday, he sought care at an urgent care facility where a chest x-ray confirmed pneumonia. I don't have that xray report.  He was prescribed doxycycline  and levofloxacin , and he continues to use albuterol  nebulizer treatments three times a day. He also has a prescription for Symbicort (myself in the past), which he hopes to fill soon. His oxygen saturation at home ranges from 92% to 97%, dropping to 92% with physical activity.  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       Review of Systems  Constitutional:  Positive for fatigue. Negative for chills and fever.  HENT:  Negative for congestion,  ear pain and sinus pain.   Eyes:  Negative for pain and visual disturbance.  Respiratory:  Positive for cough, shortness of breath and wheezing.        With activity dyspnea. Occasional cough.  Cardiovascular:  Negative for chest pain and palpitations.  Genitourinary:  Negative for dysuria and flank pain.  Musculoskeletal:  Negative for back pain.  Skin:  Negative for rash.  Neurological:  Negative for dizziness, syncope and light-headedness.  Hematological:  Negative for adenopathy. Does not bruise/bleed easily.  Psychiatric/Behavioral:  Negative for behavioral problems and decreased concentration.     Past Medical History:  Diagnosis Date   Blood transfusion without reported diagnosis    many years ago   Diabetes mellitus without complication (HCC)    Dyspnea    exerting self,ie: running   GERD (gastroesophageal reflux disease)    ? medicine related   Hyperlipidemia    Hypertension    Pneumonia    Pre-diabetes    Sarcoidosis    Sleep apnea    wears cpap     Social History   Socioeconomic History   Marital status: Divorced    Spouse name: Not on file   Number of children: Not on file   Years of education: Not on file   Highest education level: Not on file  Occupational History   Not on file  Tobacco Use   Smoking status: Former    Types: Cigars   Smokeless tobacco: Never  Vaping Use   Vaping status: Never Used  Substance and Sexual Activity   Alcohol use: Yes    Comment: ocassionally   Drug use: No  Sexual activity: Not on file  Other Topics Concern   Not on file  Social History Narrative   Not on file   Social Drivers of Health   Financial Resource Strain: Medium Risk (07/15/2023)   Overall Financial Resource Strain (CARDIA)    Difficulty of Paying Living Expenses: Somewhat hard  Food Insecurity: No Food Insecurity (07/21/2023)   Hunger Vital Sign    Worried About Running Out of Food in the Last Year: Never true    Ran Out of Food in the Last Year:  Never true  Recent Concern: Food Insecurity - Food Insecurity Present (07/17/2023)   Hunger Vital Sign    Worried About Running Out of Food in the Last Year: Sometimes true    Ran Out of Food in the Last Year: Patient declined  Transportation Needs: No Transportation Needs (07/21/2023)   PRAPARE - Administrator, Civil Service (Medical): No    Lack of Transportation (Non-Medical): No  Physical Activity: Insufficiently Active (07/15/2023)   Exercise Vital Sign    Days of Exercise per Week: 2 days    Minutes of Exercise per Session: 30 min  Stress: Stress Concern Present (07/15/2023)   [employer] of Occupational Health - Occupational Stress Questionnaire    Feeling of Stress : To some extent  Social Connections: Unknown (07/15/2023)   Social Connection and Isolation Panel [NHANES]    Frequency of Communication with Friends and Family: Once a week    Frequency of Social Gatherings with Friends and Family: Patient declined    Attends Religious Services: Patient declined    Active Member of Clubs or Organizations: No    Attends Engineer, Structural: Not on file    Marital Status: Patient declined  Intimate Partner Violence: Not At Risk (07/21/2023)   Humiliation, Afraid, Rape, and Kick questionnaire    Fear of Current or Ex-Partner: No    Emotionally Abused: No    Physically Abused: No    Sexually Abused: No    Past Surgical History:  Procedure Laterality Date   BRONCHIAL WASHINGS  04/26/2021   Procedure: BRONCHIAL WASHINGS;  Surgeon: Kara Dorn NOVAK, MD;  Location: [facility];  Service: Pulmonary;;   KNEE ARTHROSCOPY WITH MEDIAL MENISECTOMY Right 06/18/2018   Procedure: Right knee arthroscopy, partial medial menisectomy, debridement and drainage of cyst;  Surgeon: Duwayne Purchase, MD;  Location: [facility] OR;  Service: Orthopedics;  Laterality: Right;  60 mins   LOBECTOMY Left 2002   lower left  ( cyst on the lobe)in [facility] in [place], [place]   VIDEO  BRONCHOSCOPY N/A 04/26/2021   Procedure: FLEX BRONCHOSCOPY WITHOUT FLUORO;  Surgeon: Kara Dorn NOVAK, MD;  Location: [facility];  Service: Pulmonary;  Laterality: N/A;    Family History  Problem Relation Age of Onset   COPD Mother    Alcohol abuse Father    Arthritis Father    Diabetes Father    Heart disease Father    Hyperlipidemia Father    Kidney disease Father    Diabetes Brother    Stroke Brother    Alcohol abuse Brother    Diabetes Brother    Drug abuse Brother    Stomach cancer Paternal Uncle    Colon cancer Neg Hx    Colon polyps Neg Hx    Esophageal cancer Neg Hx    Rectal cancer Neg Hx     Allergies  Allergen Reactions   Naproxen Hives   Penicillin G     Current Outpatient  Medications on File Prior to Visit  Medication Sig Dispense Refill   albuterol  (PROVENTIL ) (2.5 MG/3ML) 0.083% nebulizer solution Take 3 mLs (2.5 mg total) by nebulization 3 (three) times daily as needed for wheezing or shortness of breath. 30 mL 5   albuterol  (VENTOLIN  HFA) 108 (90 Base) MCG/ACT inhaler Inhale 2 puffs into the lungs every 4 (four) hours as needed for wheezing or shortness of breath. 18 g 5   amLODipine  (NORVASC ) 5 MG tablet Take 1 tablet (5 mg total) by mouth daily. 90 tablet 0   atorvastatin  (LIPITOR) 10 MG tablet TAKE 1 TABLET BY MOUTH EVERY DAY 90 tablet 3   budesonide -formoterol  (SYMBICORT ) 160-4.5 MCG/ACT inhaler Inhale 2 puffs into the lungs 2 (two) times daily. (Patient not taking: Reported on 07/21/2023) 1 each 3   doxycycline  (VIBRA -TABS) 100 MG tablet Take 1 tablet (100 mg total) by mouth every 12 (twelve) hours. 3 tablet 0   HYDROcodone  bit-homatropine (HYCODAN) 5-1.5 MG/5ML syrup Take 5 mLs by mouth every 8 (eight) hours as needed for cough. (Patient not taking: Reported on 07/21/2023) 120 mL 0   losartan  (COZAAR ) 100 MG tablet TAKE 1 TABLET BY MOUTH EVERY DAY 90 tablet 0   metFORMIN  (GLUCOPHAGE ) 500 MG tablet TAKE 1 TABLET BY MOUTH 2 TIMES DAILY WITH A MEAL. 180  tablet 0   Multiple Vitamins-Minerals (MENS ONE DAILY PO) Take 1 tablet by mouth daily.      predniSONE  (DELTASONE ) 10 MG tablet Take 4 tablets (40 mg total) by mouth daily for 5 days, THEN 3 tablets (30 mg total) daily for 5 days, THEN 2 tablets (20 mg total) daily for 5 days, THEN 1 tablet (10 mg total) daily for 5 days. 30 tablet 0   sodium chloride  HYPERTONIC 3 % nebulizer solution Take by nebulization 3 (three) times daily. Use flutter valve after each nebulizer use 750 mL 12   tadalafil  (CIALIS ) 5 MG tablet Take 1 tablet (5 mg total) by mouth daily as needed for erectile dysfunction. 90 tablet 3   tamsulosin  (FLOMAX ) 0.4 MG CAPS capsule Take 1 capsule (0.4 mg total) by mouth in the morning and at bedtime. 60 capsule 11   traZODone  (DESYREL ) 50 MG tablet Take 1 tablet (50 mg total) by mouth at bedtime as needed for sleep. 10 tablet 0   No current facility-administered medications on file prior to visit.    BP 122/64   Pulse 76   Resp 18   Ht 6' 2 (1.88 m)   Wt 293 lb (132.9 kg)   SpO2 97%   BMI 37.62 kg/m        Objective:   Physical Exam  General Mental Status- Alert. General Appearance- Not in acute distress.   Skin General: Color- Normal Color. Moisture- Normal Moisture.  Neck No JVD. No tracheal deviation.  Chest and Lung Exam Auscultation: Breath Sounds:Even and unlabored. Left lung field rough breath sounds.  Cardiovascular Auscultation:Rythm- RRR Murmurs & Other Heart Sounds:Auscultation of the heart reveals- No Murmurs.  Abdomen Inspection:-Inspeection Normal. Palpation/Percussion:Note:No mass. Palpation and Percussion of the abdomen reveal- Non Tender, Non Distended + BS, no rebound or guarding.   Neurologic Cranial Nerve exam:- CN III-XII intact(No nystagmus), symmetric smile. Strength:- 5/5 equal and symmetric strength both upper and lower extremities.    Legs- calfs symetric. Negative homans signs.      Assessment & Plan:   Assessment and  Plan    Recurrent Pneumonia Recent hospitalization for acute respiratory failure and hypoxia secondary to influenza pneumonia. Subsequent  development of pneumonia a week later or so after dc.. Currently on doxycycline  and levofloxacin . -Continue doxycycline  and levofloxacin  as prescribed. -Repeat chest x-ray next Thursday or Friday to assess response to treatment.  Bronchiectasis and Sarcoidosis Chronic lung conditions likely contributing to recurrent pneumonia. Currently on albuterol  nebulizer treatments three times a day and Symbicort . -Continue albuterol  nebulizer treatments and Symbicort . -Continue current antibiotics -Follow-up with pulmonologist on Tuesday.  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 -Complete short-term disability paperwork for patient.   Follow up date to be determined after pulmonologist visit and repeat chest xray

## 2023-08-04 ENCOUNTER — Telehealth: Payer: Self-pay | Admitting: Medical

## 2023-08-04 ENCOUNTER — Telehealth: Payer: Self-pay | Admitting: Pulmonary Disease

## 2023-08-04 DIAGNOSIS — G4733 Obstructive sleep apnea (adult) (pediatric): Secondary | ICD-10-CM

## 2023-08-04 NOTE — Telephone Encounter (Signed)
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 Last sleep study was 2019. Last office visit 08/2022, next appt 08/26/23. Can we place order prior to appointment?

## 2023-08-04 NOTE — Telephone Encounter (Signed)
Copied from CRM [id number]. Topic: General - Other >> Aug 04, 2023 10:48 AM Gaetano Hawthorne wrote: Reason for CRM: Patient was seen last week by the office and FMLA/short term disability paperwork was filled out - the Patient's supervisor, Sharolyn Douglas is requesting a letter from the provider stating his condition and that he needs to be on short term disability until further notice for HR purposes. Can we send this letter over to this MyChart? Please contact that patient with any questions.

## 2023-08-04 NOTE — Telephone Encounter (Signed)
Patient would like order for sleep study. 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 Also need last years CPAP readings. Patient phone number is [phone].

## 2023-08-04 NOTE — Telephone Encounter (Signed)
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

## 2023-08-05 ENCOUNTER — Encounter: Payer: Self-pay | Admitting: Medical

## 2023-08-05 ENCOUNTER — Ambulatory Visit (HOSPITAL_BASED_OUTPATIENT_CLINIC_OR_DEPARTMENT_OTHER)
Admission: RE | Admit: 2023-08-05 | Discharge: 2023-08-05 | Disposition: A | Discharge status: Home / Self Care | Payer: BC Managed Care – PPO | Source: Ambulatory Visit | Attending: Medical | Admitting: Medical

## 2023-08-05 DIAGNOSIS — R0600 Dyspnea, unspecified: Secondary | ICD-10-CM | POA: Diagnosis not present

## 2023-08-05 DIAGNOSIS — D869 Sarcoidosis, unspecified: Secondary | ICD-10-CM | POA: Diagnosis not present

## 2023-08-05 DIAGNOSIS — J189 Pneumonia, unspecified organism: Secondary | ICD-10-CM | POA: Insufficient documentation

## 2023-08-05 DIAGNOSIS — J470 Bronchiectasis with acute lower respiratory infection: Secondary | ICD-10-CM | POA: Insufficient documentation

## 2023-08-05 NOTE — Telephone Encounter (Signed)
Patient was advised and reports he received the letter off his MyChart. Also patient reported dropping off a letter from [employer] today,08/05/23 that need to be filled out due to his power scheduled for disconnection on 08/18/23 and he use a CPAP machine at night and nebulizer machine during the day. Letter was giving to [employer], PA-C today, 08/05/23 to be filled out so can be faxed to [facility] [employer] so power want be shut off.

## 2023-08-07 NOTE — Telephone Encounter (Signed)
Ordered home sleep study

## 2023-08-09 NOTE — Addendum Note (Signed)
 Addended by: Gwenevere Abbot on: 08/09/2023 09:12 AM   Modules accepted: Orders

## 2023-08-18 ENCOUNTER — Telehealth: Payer: Self-pay | Admitting: Medical

## 2023-08-18 NOTE — Telephone Encounter (Signed)
 Pt sent mychart message , PCP will reply via mychart

## 2023-08-18 NOTE — Telephone Encounter (Signed)
 Copied from CRM [id number]. Topic: General - Other >> Aug 18, 2023 10:51 AM Isabell A wrote: Reason for CRM: Patient is calling to check the status of his [employer] short term disability form, states if it isn't signed and sent back it will keep him from receiving his checks.

## 2023-08-19 ENCOUNTER — Ambulatory Visit

## 2023-08-19 DIAGNOSIS — G4733 Obstructive sleep apnea (adult) (pediatric): Secondary | ICD-10-CM

## 2023-08-28 DIAGNOSIS — J479 Bronchiectasis, uncomplicated: Secondary | ICD-10-CM | POA: Diagnosis not present

## 2023-08-28 DIAGNOSIS — G4733 Obstructive sleep apnea (adult) (pediatric): Secondary | ICD-10-CM | POA: Diagnosis not present

## 2023-09-03 DIAGNOSIS — J479 Bronchiectasis, uncomplicated: Secondary | ICD-10-CM | POA: Diagnosis not present

## 2023-09-07 ENCOUNTER — Ambulatory Visit: Payer: BC Managed Care – PPO | Admitting: Pulmonary Disease

## 2023-09-07 ENCOUNTER — Encounter: Payer: Self-pay | Admitting: Pulmonary Disease

## 2023-09-07 VITALS — BP 120/79 | HR 93 | Ht 74.0 in | Wt 300.8 lb

## 2023-09-07 DIAGNOSIS — G4733 Obstructive sleep apnea (adult) (pediatric): Secondary | ICD-10-CM

## 2023-09-07 DIAGNOSIS — J479 Bronchiectasis, uncomplicated: Secondary | ICD-10-CM

## 2023-09-07 MED ORDER — STIOLTO RESPIMAT 2.5-2.5 MCG/ACT IN AERS
2.0000 | INHALATION_SPRAY | Freq: Every day | RESPIRATORY_TRACT | 11 refills | Status: AC
Start: 2023-09-07 — End: ?

## 2023-09-07 MED ORDER — SODIUM CHLORIDE 3 % IN NEBU: INHALATION_SOLUTION | Freq: Three times a day (TID) | RESPIRATORY_TRACT | 12 refills | Status: AC

## 2023-09-07 NOTE — Progress Notes (Signed)
 Synopsis: Referred in October 2022 for abnormal CT Chest by Earnestine Leys, PA  Subjective:   PATIENT ID: Jackson Hatfield GENDER: male DOB: 12-28-1970, MRN: [id number]  HPI  Chief Complaint  Patient presents with   Follow-up   Jackson Hatfield is a 53 year old male, former smoker with history of obesity, obstructive sleep apnea on CPAP, sarcoidosis and status post left lower lobectomy in 2003 for cyst who returns to pulmonary clinic for bronchiectasis.   CPAP download 30/30 days, 87% over 4 hours, AHI 2.1/hr  He experiences variable breathing difficulties, with some days allowing him to walk without issues and other days causing him to struggle to breathe after a short distance. He describes having 'good days and horrible days,' with symptoms including shortness of breath and occasional fevers and chills. His oxygen saturation levels fluctuate between 86% and 95%.  He has been hospitalized multiple times for pneumonia, with recent episodes leading to hospitalizations. He experiences difficulty clearing mucus, which he believes contributes to his respiratory infections. He uses a CPAP machine for sleep apnea, which he adjusted himself for better comfort.  He has a history of sarcoidosis diagnosed in 2006 and bronchiectasis, which contribute to his respiratory issues. He recalls a bronchoscopy performed last year at [facility], which identified bronchiectasis, but he does not remember the specific findings. He has had a lung resection in the past and expresses reluctance to undergo further surgical procedures.  He underwent a pulmonary function test at [facility], where he experienced significant symptoms during the fourth to fifth minutes of a six-minute walk test. He has not received any new treatments since the test.  He uses a nebulizer and inhaler as needed, though he has not picked up his prescribed Symbicort. He is currently on short-term disability due to his respiratory condition.  He lives in a  townhouse with central air and has a cat, which may contribute to his respiratory issues.  OV 01/13/23 He lost his insurance and was not able to be seen until now. He was not able to have the referral to [facility] pulmonary due to his insruancde issue. He reports intermittent fevers, ongoing shortness of breath, orthopnea and some swelling in his legs. He has been using his CPAP. He continues to gain weight. He was seen in the ED on 10/10/21 for shortness of breath, congestion, cough and night sweats.He was treated with augmentin for respiratory infection. CTA Chest showed cystic bronchiectasis with bronchial wall thickening and fluid filled bronchi, more so than his previous scan.  In the past inhalers and steroid trials have not helped his breathing or shortness of breath.   He reports his father had MI in his late 51s and a brother with CAD who had MI and stents placed in his early 28s.  OV 07/01/21 Patient was started on prolonged course of augmentin at last visit. He reports he has had significant improvement in his cough and night sweats. He continues to have dyspnea. Denies fevers or chills. Denies diarrhea. He is not coughing up any sputum. He was not able to get hypertonic saline as his local pharmacy did not carry the nebulizer solution. He continues to have fatigue. He is not consistently wearing his CPAP machine.  OV 05/31/21 Patient underwent bronchoscopy with BAL on 04/26/2021 with cultures growing significant amounts of haemophilus influenzae.  Bronchoscopy findings were also significant for broncholith involvement at the bifurcation of the left upper lobe and lingular segments.  There is significant narrowing of the the airway  due to the broncholith.  To date fungal and AFB cultures remain negative.  He was treated with 14 days of doxycycline with initial improvement in his night sweats.  Since completing this course of antibiotics he reports he has intermittent mild night sweats.   He continues to have cough and shortness of breath.  He stopped using Stiolto 2 puffs daily once he received albuterol nebulizer treatments which she has been doing twice daily.  He reports that the flutter valve is not providing benefit to mucus clearance.  OV 04/11/21 He recently went to the ER on 03/31/2021 for shortness of breath, fatigue, weight loss, fever and chills.  Laboratory evaluation was unremarkable including CBC and BMP.  CT chest scan was obtained which showed mildly enlarged mediastinal and hilar lymph nodes with some containing central calcifications consistent with his history of sarcoidosis.  There is notable left lower lobectomy.  Scarring of the left upper lobe.  There is central peribronchial thickening especially of the left lung bronchiectasis of the left upper lobe with some dilated airways containing fluid/mucus.  Tree-in-bud opacities in the left upper lobe.  Patient reports a 25 pound weight loss since March.  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  He does not have any wheezing and denies any sputum production.  He reports being diagnosed with cystic fibrosis when he was younger but has not followed up with any cystic fibrosis specialist most of his life.  He is using CPAP nightly for his obstructive sleep apnea.  He underwent left lower lobectomy in 2003 when he used to live in [place] and was told that he has a cyst.  When he moved to [place] he underwent further evaluation including a mediastinoscopy and was diagnosed with sarcoidosis in 2006.  Past Medical History:  Diagnosis Date   Blood transfusion without reported diagnosis    many years ago   Diabetes mellitus without complication (HCC)    Dyspnea    exerting self,ie: running   GERD (gastroesophageal reflux disease)     ? medicine related   Hyperlipidemia    Hypertension    Pneumonia    Pre-diabetes    Sarcoidosis    Sleep apnea    wears cpap     Family History  Problem Relation Age of Onset   COPD Mother    Alcohol abuse Father    Arthritis Father    Diabetes Father    Heart disease Father    Hyperlipidemia Father    Kidney disease Father    Diabetes Brother    Stroke Brother    Alcohol abuse Brother    Diabetes Brother    Drug abuse Brother    Stomach cancer Paternal Uncle    Colon cancer Neg Hx    Colon polyps Neg Hx    Esophageal cancer Neg Hx    Rectal cancer Neg Hx      Social History   Socioeconomic History   Marital status: Divorced    Spouse name: Not on file   Number of children: Not on file   Years of education: Not on file   Highest education level: Not on file  Occupational History   Not on file  Tobacco Use   Smoking status: Former    Types: Cigars   Smokeless tobacco: Never  Vaping Use   Vaping status: Never Used  Substance and Sexual Activity   Alcohol use: Yes    Comment: ocassionally   Drug use: No   Sexual activity: Not on file  Other Topics Concern   Not on file  Social History Narrative   Not on file   Social Drivers of Health   Financial Resource Strain: Medium Risk (07/15/2023)   Overall Financial Resource Strain (CARDIA)    Difficulty of Paying Living Expenses: Somewhat hard  Food Insecurity: No Food Insecurity (07/21/2023)   Hunger Vital Sign    Worried About Running Out of Food in the Last Year: Never true    Ran Out of Food in the Last Year: Never true  Recent Concern: Food Insecurity - Food Insecurity Present (07/17/2023)   Hunger Vital Sign    Worried About Running Out of Food in the Last Year: Sometimes true    Ran Out of Food in the Last Year: Patient declined  Transportation Needs: No Transportation Needs (07/21/2023)   PRAPARE - Administrator, Civil Service (Medical): No    Lack of Transportation (Non-Medical): No   Physical Activity: Insufficiently Active (07/15/2023)   Exercise Vital Sign    Days of Exercise per Week: 2 days    Minutes of Exercise per Session: 30 min  Stress: Stress Concern Present (07/15/2023)   [employer] of Occupational Health - Occupational Stress Questionnaire    Feeling of Stress : To some extent  Social Connections: Unknown (07/15/2023)   Social Connection and Isolation Panel [NHANES]    Frequency of Communication with Friends and Family: Once a week    Frequency of Social Gatherings with Friends and Family: Patient declined    Attends Religious Services: Patient declined    Database administrator or Organizations: No    Attends Engineer, structural: Not on file    Marital Status: Patient declined  Intimate Partner Violence: Not At Risk (07/21/2023)   Humiliation, Afraid, Rape, and Kick questionnaire    Fear of Current or Ex-Partner: No    Emotionally Abused: No    Physically Abused: No    Sexually Abused: No     Allergies  Allergen Reactions   Naproxen Hives   Penicillin G      Outpatient Medications Prior to Visit  Medication Sig Dispense Refill   albuterol (PROVENTIL) (2.5 MG/3ML) 0.083% nebulizer solution Take 3 mLs (2.5 mg total) by nebulization 3 (three) times daily as needed for wheezing or shortness of breath. 30 mL 5   albuterol (VENTOLIN HFA) 108 (90 Base) MCG/ACT inhaler Inhale 2 puffs into the lungs every 4 (four) hours as needed for wheezing or shortness of breath. 18 g 5   amLODipine (NORVASC) 5 MG tablet Take 1 tablet (5 mg total) by mouth daily. 90 tablet 0   atorvastatin (LIPITOR) 10 MG tablet TAKE 1 TABLET BY MOUTH EVERY DAY 90 tablet 3   losartan (COZAAR) 100 MG tablet TAKE 1 TABLET BY MOUTH EVERY DAY 90 tablet 0   metFORMIN (GLUCOPHAGE) 500 MG tablet TAKE 1 TABLET BY MOUTH 2 TIMES DAILY WITH A MEAL. 180 tablet 0   Multiple Vitamins-Minerals (MENS ONE DAILY PO) Take 1 tablet by mouth daily.      tadalafil (CIALIS) 5 MG tablet Take 1  tablet (5 mg total) by mouth daily as needed for erectile dysfunction. 90 tablet 3   tamsulosin (FLOMAX) 0.4 MG CAPS capsule Take 1 capsule (0.4 mg total) by mouth in the  morning and at bedtime. 60 capsule 11   traZODone (DESYREL) 50 MG tablet Take 1 tablet (50 mg total) by mouth at bedtime as needed for sleep. 10 tablet 0   doxycycline (VIBRA-TABS) 100 MG tablet Take 1 tablet (100 mg total) by mouth every 12 (twelve) hours. 3 tablet 0   sodium chloride HYPERTONIC 3 % nebulizer solution Take by nebulization 3 (three) times daily. Use flutter valve after each nebulizer use 750 mL 12   budesonide-formoterol (SYMBICORT) 160-4.5 MCG/ACT inhaler Inhale 2 puffs into the lungs 2 (two) times daily. (Patient not taking: Reported on 09/07/2023) 1 each 3   budesonide-formoterol (SYMBICORT) 160-4.5 MCG/ACT inhaler Inhale 2 puffs into the lungs 2 (two) times daily. (Patient not taking: Reported on 09/07/2023) 1 each 3   HYDROcodone bit-homatropine (HYCODAN) 5-1.5 MG/5ML syrup Take 5 mLs by mouth every 8 (eight) hours as needed for cough. (Patient not taking: Reported on 07/21/2023) 120 mL 0   No facility-administered medications prior to visit.    Review of Systems  Constitutional:  Positive for fever. Negative for chills, malaise/fatigue and weight loss.  HENT:  Negative for congestion, sinus pain and sore throat.   Eyes: Negative.   Respiratory:  Positive for cough and shortness of breath. Negative for hemoptysis, sputum production and wheezing.   Cardiovascular:  Negative for chest pain, palpitations, orthopnea, claudication and leg swelling.  Gastrointestinal:  Negative for abdominal pain, heartburn, nausea and vomiting.  Genitourinary: Negative.   Musculoskeletal:  Negative for joint pain and myalgias.  Skin:  Negative for rash.  Neurological:  Negative for weakness.  Endo/Heme/Allergies: Negative.   Psychiatric/Behavioral: Negative.      Objective:   Vitals:   09/07/23 1104  BP: 120/79  Pulse:  93  SpO2: 97%  Weight: (!) 300 lb 12.8 oz (136.4 kg)  Height: 6\' 2"  (1.88 m)      Physical Exam Constitutional:      General: He is not in acute distress.    Appearance: Normal appearance. He is obese.  HENT:     Head: Normocephalic and atraumatic.  Eyes:     Conjunctiva/sclera: Conjunctivae normal.  Cardiovascular:     Rate and Rhythm: Normal rate and regular rhythm.     Pulses: Normal pulses.     Heart sounds: Normal heart sounds. No murmur heard. Pulmonary:     Effort: Pulmonary effort is normal.     Breath sounds: No wheezing, rhonchi or rales (left base).  Musculoskeletal:     Right lower leg: No edema.     Left lower leg: No edema.  Skin:    General: Skin is warm and dry.  Neurological:     General: No focal deficit present.     Mental Status: He is alert.    CBC    Component Value Date/Time   WBC 8.2 07/18/2023 0747   RBC 4.61 07/18/2023 0747   HGB 13.2 07/18/2023 0747   HGB 14.2 12/29/2022 0946   HCT 39.2 07/18/2023 0747   HCT 42.5 12/29/2022 0946   PLT 264 07/18/2023 0747   PLT 327 12/29/2022 0946   MCV 85.0 07/18/2023 0747   MCV 87 12/29/2022 0946   MCH 28.6 07/18/2023 0747   MCHC 33.7 07/18/2023 0747   RDW 12.9 07/18/2023 0747   RDW 13.4 12/29/2022 0946   LYMPHSABS 1.1 07/16/2023 1054   MONOABS 0.5 07/16/2023 1054   EOSABS 0.2 07/16/2023 1054   BASOSABS 0.0 07/16/2023 1054      Latest Ref Rng & Units 07/18/2023  7:47 AM 07/17/2023    5:26 AM 07/16/2023   10:54 AM  BMP  Glucose 70 - 99 mg/dL 161  096  045   BUN 6 - 20 mg/dL 18  12  12    Creatinine 0.61 - 1.24 mg/dL 4.09  8.11  9.14   Sodium 135 - 145 mmol/L 136  135  136   Potassium 3.5 - 5.1 mmol/L 4.3  4.3  4.1   Chloride 98 - 111 mmol/L 104  104  102   CO2 22 - 32 mmol/L 23  23  26    Calcium 8.9 - 10.3 mg/dL 9.0  9.0  9.0    Chest imaging: CT Chest 07/16/23 Cardiovascular: Stable heart size since 2023. No pericardial effusion. Minimal calcified atherosclerosis of the thoracic  aorta.   Mediastinum/Nodes: Chronic increased number of generally subcentimeter mediastinal lymph nodes, some faintly calcified (series 2, image 74), stable since 2023. The largest node is near the left hilum, 1.5 cm short axis.   Lungs/Pleura: Dependent retained secretions in the trachea on series 4, image 31. These continue into the low left mainstem bronchus. There is severe chronic left lower lobe bronchiectasis with associated more proximal chronic airway thickening. Associated architectural distortion in the left lingula and lower lobe, stable nodularity since 2023.   Left apical lung scarring and architectural distortion on series 4, image 29 is also stable. Scattered additional bilateral pulmonary nodularity also has not significantly changed since 2023. Bronchiectasis in the medial segment of the right middle lobe is stable. Medial right lower lobe peribronchial patchy and nodular opacity is stable (series 4, image 116). No pleural effusion. No areas of worsening ventilation.  CTA Chest 03/31/21 -No evidence of pulmonary embolism. -Numerous normal sized to mildly enlarged mediastinal and hilar lymph nodes consistent with history of sarcoidosis. -Post LEFT lower lobectomy.  -Scattered pulmonary nodular foci bilaterally, could be related to sarcoidosis, stability uncertain; if patient has any prior outside CT imaging, these would be of benefit in establishing stability of these findings. -Central bronchitic changes.  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   PFT:     No data to display          Labs: 04/26/2021 BAL cell count 4,720 nucleated cells with neutrophil predominance. 04/26/2021 respiratory culture-abundant haemophilus influenza beta-lactamase negative  Path:  Echo:  Heart Catheterization:  Assessment & Plan:    Bronchiectasis without complication (HCC) - Plan: sodium chloride HYPERTONIC 3 % nebulizer solution, Tiotropium Bromide-Olodaterol (STIOLTO RESPIMAT) 2.5-2.5 MCG/ACT AERS, AMB referral to pulmonary rehabilitation  OSA (obstructive sleep apnea)  Discussion: Jackson Hatfield is a 53 year old male, former smoker with history of obesity, obstructive sleep apnea on CPAP, sarcoidosis and status post left lower lobectomy in 2003 for abnormal nodule who returns to pulmonary clinic for bronchiectasis.   Bronchiectasis Chronic bronchiectasis with recurrent infections, intermittent dyspnea, and fluctuating oxygen saturation. Previous broncholith complicating secretion clearance. Haemophilus influenza noted on repeat respiratory cultures. - Request pulmonary function test results from [facility]. - Prescribe Stiolto inhaler, two puffs a day. - Refer to pulmonary rehabilitation program. - Emphasize nebulizer treatments and flutter valve for airway clearance.   Recurrent Pneumonia Recurrent pneumonia likely secondary to bronchiectasis and impaired mucus clearance. High risk for infections due to abnormal airways. - Emphasize nebulizer treatments and flutter valve for airway clearance. - Consider long-term antibiotic therapy if recurrent infections persist.  Sarcoidosis Sarcoidosis diagnosed in 2006. No evidence  of active disease; symptoms attributed to bronchiectasis.  Obstructive Sleep Apnea Obstructive sleep apnea managed with CPAP therapy. Recent adjustments improved symptoms. - Continue CPAP therapy with current settings.  Follow up in 6 months  Melody Comas, MD  Pulmonary & Critical Care Office: [phone]   Current Outpatient Medications:    albuterol (PROVENTIL) (2.5 MG/3ML) 0.083% nebulizer solution, Take 3 mLs (2.5 mg total) by nebulization 3 (three) times daily as needed for wheezing or shortness of breath., Disp: 30 mL, Rfl: 5   albuterol (VENTOLIN HFA) 108 (90 Base) MCG/ACT  inhaler, Inhale 2 puffs into the lungs every 4 (four) hours as needed for wheezing or shortness of breath., Disp: 18 g, Rfl: 5   amLODipine (NORVASC) 5 MG tablet, Take 1 tablet (5 mg total) by mouth daily., Disp: 90 tablet, Rfl: 0   atorvastatin (LIPITOR) 10 MG tablet, TAKE 1 TABLET BY MOUTH EVERY DAY, Disp: 90 tablet, Rfl: 3   losartan (COZAAR) 100 MG tablet, TAKE 1 TABLET BY MOUTH EVERY DAY, Disp: 90 tablet, Rfl: 0   metFORMIN (GLUCOPHAGE) 500 MG tablet, TAKE 1 TABLET BY MOUTH 2 TIMES DAILY WITH A MEAL., Disp: 180 tablet, Rfl: 0   Multiple Vitamins-Minerals (MENS ONE DAILY PO), Take 1 tablet by mouth daily. , Disp: , Rfl:    tadalafil (CIALIS) 5 MG tablet, Take 1 tablet (5 mg total) by mouth daily as needed for erectile dysfunction., Disp: 90 tablet, Rfl: 3   tamsulosin (FLOMAX) 0.4 MG CAPS capsule, Take 1 capsule (0.4 mg total) by mouth in the morning and at bedtime., Disp: 60 capsule, Rfl: 11   Tiotropium Bromide-Olodaterol (STIOLTO RESPIMAT) 2.5-2.5 MCG/ACT AERS, Inhale 2 puffs into the lungs daily., Disp: 4 g, Rfl: 11   traZODone (DESYREL) 50 MG tablet, Take 1 tablet (50 mg total) by mouth at bedtime as needed for sleep., Disp: 10 tablet, Rfl: 0   sodium chloride HYPERTONIC 3 % nebulizer solution, Take by nebulization 3 (three) times daily. Use flutter valve after each nebulizer use, Disp: 750 mL, Rfl: 12

## 2023-09-07 NOTE — Patient Instructions (Addendum)
 We will request Pulmonary Function Tests Results from [facility]  Bronchial Hygiene Regimen - Use albuterol neb and hypertonic saline twice daily followed by flutter valve  Use Stiolto inhaler 2 puffs daily  Continue CPAP nightly  Follow up in 6 months

## 2023-09-10 DIAGNOSIS — R069 Unspecified abnormalities of breathing: Secondary | ICD-10-CM | POA: Diagnosis not present

## 2023-09-10 DIAGNOSIS — J479 Bronchiectasis, uncomplicated: Secondary | ICD-10-CM | POA: Diagnosis not present

## 2023-09-13 ENCOUNTER — Encounter: Payer: Self-pay | Admitting: Pulmonary Disease

## 2023-09-14 ENCOUNTER — Encounter: Payer: Self-pay | Admitting: Medical

## 2023-09-14 ENCOUNTER — Ambulatory Visit: Admitting: Medical

## 2023-09-14 ENCOUNTER — Ambulatory Visit (HOSPITAL_BASED_OUTPATIENT_CLINIC_OR_DEPARTMENT_OTHER)
Admission: RE | Admit: 2023-09-14 | Discharge: 2023-09-14 | Disposition: A | Discharge status: Home / Self Care | Source: Ambulatory Visit | Attending: Medical | Admitting: Medical

## 2023-09-14 VITALS — BP 122/62 | HR 88 | Resp 18 | Ht 74.0 in | Wt 299.0 lb

## 2023-09-14 DIAGNOSIS — R918 Other nonspecific abnormal finding of lung field: Secondary | ICD-10-CM | POA: Diagnosis not present

## 2023-09-14 DIAGNOSIS — R059 Cough, unspecified: Secondary | ICD-10-CM

## 2023-09-14 DIAGNOSIS — R5383 Other fatigue: Secondary | ICD-10-CM | POA: Insufficient documentation

## 2023-09-14 DIAGNOSIS — Z8701 Personal history of pneumonia (recurrent): Secondary | ICD-10-CM | POA: Diagnosis not present

## 2023-09-14 NOTE — Progress Notes (Signed)
 Subjective:    Patient ID: Jackson Hatfield, male    DOB: 1971-03-25, 53 y.o.   MRN: [id number]  HPI   Last pulmonologist [name] note below in "  "Bronchiectasis without complication (HCC) - Plan: sodium chloride HYPERTONIC 3 % nebulizer solution, Tiotropium Bromide-Olodaterol (STIOLTO RESPIMAT) 2.5-2.5 MCG/ACT AERS, AMB referral to pulmonary rehabilitation   OSA (obstructive sleep apnea)   Discussion: Jackson Hatfield is a 53 year old male, former smoker with history of obesity, obstructive sleep apnea on CPAP, sarcoidosis and status post left lower lobectomy in 2003 for abnormal nodule who returns to pulmonary clinic for bronchiectasis.    Bronchiectasis Chronic bronchiectasis with recurrent infections, intermittent dyspnea, and fluctuating oxygen saturation. Previous broncholith complicating secretion clearance. Haemophilus influenza noted on repeat respiratory cultures. - Request pulmonary function test results from [facility]. - Prescribe Stiolto inhaler, two puffs a day. - Refer to pulmonary rehabilitation program. - Emphasize nebulizer treatments and flutter valve for airway clearance.    Recurrent Pneumonia Recurrent pneumonia likely secondary to bronchiectasis and impaired mucus clearance. High risk for infections due to abnormal airways. - Emphasize nebulizer treatments and flutter valve for airway clearance. - Consider long-term antibiotic therapy if recurrent infections persist.   Sarcoidosis Sarcoidosis diagnosed in 2006. No evidence of active disease; symptoms attributed to bronchiectasis.   Obstructive Sleep Apnea Obstructive sleep apnea managed with CPAP therapy. Recent adjustments improved symptoms. - Continue CPAP therapy with current settings.   Follow up in 6 months"  Jackson Hatfield is a 53 year old male with bronchiectasis, obstructive sleep apnea, and sarcoidosis who presents with shortness of breath and fatigue. He was referred by his pulmonologist, Dr. Irena Hatfield,  for evaluation of persistent respiratory symptoms.  He experiences significant shortness of breath and fatigue, with symptoms varying in intensity from day to day. 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  He underwent a pulmonary function test at [facility], but the results were not available during his last visit. His current medications include Stiolto Respimat, which he uses daily, and occasional nebulizer treatments depending on his symptoms. He recently traveled to [place] for his brother's heart surgery, which may have impacted his routine.  In the review of symptoms, he reports an occasional non-productive cough, fatigue, and a feeling of being 'fluish.' No productive cough or other significant symptoms.              Review of Systems  Constitutional:  Positive for fatigue. Negative for chills.  HENT:  Negative for congestion and ear pain.   Respiratory:  Positive for cough and shortness of breath. Negative for choking.   Cardiovascular:  Negative for chest pain and palpitations.  Gastrointestinal:  Negative for abdominal pain.  Genitourinary:  Negative for dysuria and frequency.  Musculoskeletal:  Negative for back pain.  Skin:  Negative for rash.  Neurological:  Negative for dizziness, numbness and headaches.  Hematological:  Negative for adenopathy. Does not bruise/bleed easily.  Psychiatric/Behavioral:  Negative for behavioral problems and decreased concentration.     Past Medical History:  Diagnosis Date   Blood transfusion without reported diagnosis    many years ago   Diabetes mellitus without complication (HCC)    Dyspnea  exerting self,ie: running   GERD  (gastroesophageal reflux disease)    ? medicine related   Hyperlipidemia    Hypertension    Pneumonia    Pre-diabetes    Sarcoidosis    Sleep apnea    wears cpap     Social History   Socioeconomic History   Marital status: Divorced    Spouse name: Not on file   Number of children: Not on file   Years of education: Not on file   Highest education level: Not on file  Occupational History   Not on file  Tobacco Use   Smoking status: Former    Types: Cigars   Smokeless tobacco: Never  Vaping Use   Vaping status: Never Used  Substance and Sexual Activity   Alcohol use: Yes    Comment: ocassionally   Drug use: No   Sexual activity: Not on file  Other Topics Concern   Not on file  Social History Narrative   Not on file   Social Drivers of Health   Financial Resource Strain: Medium Risk (07/15/2023)   Overall Financial Resource Strain (CARDIA)    Difficulty of Paying Living Expenses: Somewhat hard  Food Insecurity: No Food Insecurity (07/21/2023)   Hunger Vital Sign    Worried About Running Out of Food in the Last Year: Never true    Ran Out of Food in the Last Year: Never true  Recent Concern: Food Insecurity - Food Insecurity Present (07/17/2023)   Hunger Vital Sign    Worried About Running Out of Food in the Last Year: Sometimes true    Ran Out of Food in the Last Year: Patient declined  Transportation Needs: No Transportation Needs (07/21/2023)   PRAPARE - Administrator, Civil Service (Medical): No    Lack of Transportation (Non-Medical): No  Physical Activity: Insufficiently Active (07/15/2023)   Exercise Vital Sign    Days of Exercise per Week: 2 days    Minutes of Exercise per Session: 30 min  Stress: Stress Concern Present (07/15/2023)   [employer] of Occupational Health - Occupational Stress Questionnaire    Feeling of Stress : To some extent  Social Connections: Unknown (07/15/2023)   Social Connection and Isolation Panel [NHANES]     Frequency of Communication with Friends and Family: Once a week    Frequency of Social Gatherings with Friends and Family: Patient declined    Attends Religious Services: Patient declined    Active Member of Clubs or Organizations: No    Attends Engineer, structural: Not on file    Marital Status: Patient declined  Intimate Partner Violence: Not At Risk (07/21/2023)   Humiliation, Afraid, Rape, and Kick questionnaire    Fear of Current or Ex-Partner: No    Emotionally Abused: No    Physically Abused: No    Sexually Abused: No    Past Surgical History:  Procedure Laterality Date   BRONCHIAL WASHINGS  04/26/2021   Procedure: BRONCHIAL WASHINGS;  Surgeon: Martina Sinner, MD;  Location: [facility];  Service: Pulmonary;;   KNEE ARTHROSCOPY WITH MEDIAL MENISECTOMY Right 06/18/2018   Procedure: Right knee arthroscopy, partial medial menisectomy, debridement and drainage of cyst;  Surgeon: Jene Every, MD;  Location: [facility] OR;  Service: Orthopedics;  Laterality: Right;  60 mins   LOBECTOMY Left 2002   lower left  ( cyst on the lobe)in [facility] in [place], [place]   VIDEO BRONCHOSCOPY N/A 04/26/2021   Procedure: FLEX BRONCHOSCOPY WITHOUT FLUORO;  Surgeon: Martina Sinner, MD;  Location: [facility];  Service: Pulmonary;  Laterality: N/A;    Family History  Problem Relation Age of Onset   COPD Mother    Alcohol abuse Father    Arthritis Father    Diabetes Father    Heart disease Father    Hyperlipidemia Father    Kidney disease Father    Diabetes Brother    Stroke Brother    Alcohol abuse Brother    Diabetes Brother    Drug abuse Brother    Stomach cancer Paternal Uncle    Colon cancer Neg Hx    Colon polyps Neg Hx    Esophageal cancer Neg Hx    Rectal cancer Neg Hx     Allergies  Allergen Reactions   Naproxen Hives   Penicillin G     Current Outpatient Medications on File Prior to Visit  Medication Sig Dispense Refill   albuterol (PROVENTIL)  (2.5 MG/3ML) 0.083% nebulizer solution Take 3 mLs (2.5 mg total) by nebulization 3 (three) times daily as needed for wheezing or shortness of breath. 30 mL 5   albuterol (VENTOLIN HFA) 108 (90 Base) MCG/ACT inhaler Inhale 2 puffs into the lungs every 4 (four) hours as needed for wheezing or shortness of breath. 18 g 5   amLODipine (NORVASC) 5 MG tablet Take 1 tablet (5 mg total) by mouth daily. 90 tablet 0   atorvastatin (LIPITOR) 10 MG tablet TAKE 1 TABLET BY MOUTH EVERY DAY 90 tablet 3   losartan (COZAAR) 100 MG tablet TAKE 1 TABLET BY MOUTH EVERY DAY 90 tablet 0   metFORMIN (GLUCOPHAGE) 500 MG tablet TAKE 1 TABLET BY MOUTH 2 TIMES DAILY WITH A MEAL. 180 tablet 0   Multiple Vitamins-Minerals (MENS ONE DAILY PO) Take 1 tablet by mouth daily.      sodium chloride HYPERTONIC 3 % nebulizer solution Take by nebulization 3 (three) times daily. Use flutter valve after each nebulizer use 750 mL 12   tadalafil (CIALIS) 5 MG tablet Take 1 tablet (5 mg total) by mouth daily as needed for erectile dysfunction. 90 tablet 3   tamsulosin (FLOMAX) 0.4 MG CAPS capsule Take 1 capsule (0.4 mg total) by mouth in the morning and at bedtime. 60 capsule 11   Tiotropium Bromide-Olodaterol (STIOLTO RESPIMAT) 2.5-2.5 MCG/ACT AERS Inhale 2 puffs into the lungs daily. 4 g 11   traZODone (DESYREL) 50 MG tablet Take 1 tablet (50 mg total) by mouth at bedtime as needed for sleep. 10 tablet 0   No current facility-administered medications on file prior to visit.    BP 122/62   Pulse 88   Resp 18   Ht 6\' 2"  (1.88 m)   Wt 299 lb (135.6 kg)   SpO2 96%   BMI 38.39 kg/m        Objective:   Physical Exam  General Mental Status- Alert. General Appearance- Not in acute distress.   Skin General: Color- Normal Color. Moisture- Normal Moisture.  Neck Carotid Arteries- Normal color. Moisture- Normal Moisture. No carotid bruits. No JVD.  Chest and Lung Exam Auscultation: Breath  Sounds:-CTA  Cardiovascular Auscultation:Rythm- RRR Murmurs & Other Heart Sounds:Auscultation of the heart reveals- No Murmurs.  Abdomen Inspection:-Inspeection Normal. Palpation/Percussion:Note:No mass. Palpation and Percussion of the abdomen reveal- Non Tender, Non Distended + BS, no rebound or guarding.   Neurologic Cranial Nerve exam:- CN III-XII intact(No nystagmus), symmetric smile. Strength:- 5/5 equal and symmetric strength both upper and lower extremities.  Assessment & Plan:   Patient Instructions  Bronchiectasis Chronic bronchiectasis with recurrent pneumonia and dyspnea. Severe shortness of breath, fatigue, and occasional cough. Pulmonary rehabilitation recommended but cost is a concern. Long-term antibiotic therapy considered if recurrent infections persist. Short-term disability due to symptoms. Surgical interventions not advised. - Order chest x-ray to assess current lung status. - Continue Stiolto Respimat inhaler daily. -albuterol inhaler use as needed. - Consider long-term antibiotic therapy if recurrent infections persist. - Coordinate with pulmonologist for further management and disability paperwork. - Encourage follow-up with pulmonologist, Dr. Irena Hatfield. -Filled out disability form today. Ask you to get specialist to fill out as well to make sure agree with primary care assesment.  Obstructive Sleep Apnea Obstructive sleep apnea noted in pulmonologist's comments. -Today note will be sent to disability  Sarcoidosis Sarcoidosis mentioned in pulmonologist's notes. -today note to be sent to disability.  Follow-up Follow up with primary care and pulmonologist for ongoing management of bronchiectasis and disability status. - Follow up with primary care on April 14 or sooner if needed. - Follow up with pulmonologist, Dr. Irena Hatfield, as scheduled.   Esperanza Richters, PA-C

## 2023-09-14 NOTE — Patient Instructions (Signed)
 Bronchiectasis Chronic bronchiectasis with recurrent pneumonia and dyspnea. Severe shortness of breath, fatigue, and occasional cough. Pulmonary rehabilitation recommended but cost is a concern. Long-term antibiotic therapy considered if recurrent infections persist. Short-term disability due to symptoms. Surgical interventions not advised. - Order chest x-ray to assess current lung status. - Continue Stiolto Respimat inhaler daily. -albuterol inhaler use as needed. - Consider long-term antibiotic therapy if recurrent infections persist. - Coordinate with pulmonologist for further management and disability paperwork. - Encourage follow-up with pulmonologist, Dr. Irena Cords. -Filled out disability form today. Ask you to get specialist to fill out as well to make sure agree with primary care assesment.  Obstructive Sleep Apnea Obstructive sleep apnea noted in pulmonologist's comments. -Today note will be sent to disability  Sarcoidosis Sarcoidosis mentioned in pulmonologist's notes. -today note to be sent to disability.  Follow-up Follow up with primary care and pulmonologist for ongoing management of bronchiectasis and disability status. - Follow up with primary care on April 14 or sooner if needed. - Follow up with pulmonologist, Dr. Irena Cords, as scheduled.

## 2023-09-15 DIAGNOSIS — G4733 Obstructive sleep apnea (adult) (pediatric): Secondary | ICD-10-CM | POA: Diagnosis not present

## 2023-09-15 DIAGNOSIS — D869 Sarcoidosis, unspecified: Secondary | ICD-10-CM | POA: Diagnosis not present

## 2023-09-15 DIAGNOSIS — J411 Mucopurulent chronic bronchitis: Secondary | ICD-10-CM | POA: Diagnosis not present

## 2023-09-15 DIAGNOSIS — J479 Bronchiectasis, uncomplicated: Secondary | ICD-10-CM | POA: Diagnosis not present

## 2023-09-20 ENCOUNTER — Other Ambulatory Visit: Payer: Self-pay | Admitting: Medical

## 2023-09-21 ENCOUNTER — Telehealth: Payer: Self-pay | Admitting: Pulmonary Disease

## 2023-09-21 NOTE — Telephone Encounter (Signed)
 Received Short Term Disability from [employer]. Contacted patient, patient is unsure when he can return to work. He is waiting on rehab. His PCP has him out until 4/14. Patient has not been getting paid and wanted forms filled out- did not want to wait for [name] to be back in the clinic on 4/7. Placed in Tammy's box to review FMLA to be filled out.

## 2023-09-21 NOTE — Telephone Encounter (Signed)
 Copied from CRM [id number]. Topic: General - Other >> Sep 21, 2023  9:54 AM [name] wrote: Reason for CRM: Patient need to know if office faxed papers over to [employer] (short term disability.

## 2023-09-22 NOTE — Telephone Encounter (Signed)
 Patient is calling back concerning the fmla paperwork he is needing faxed to [facility] asap cause they have stopped the checks and if paperwork is not sent back today . Then it will take another week or two for him to get a check and he can't do that . The fax number is written on the paperwork

## 2023-09-22 NOTE — Telephone Encounter (Signed)
 Copied from CRM [id number]. Topic: General - Other >> Sep 21, 2023  9:54 AM [name] wrote: Reason for CRM: Patient need to know if office faxed papers over to [facility] (short term disability. >> Sep 21, 2023  3:29 PM Chantha C wrote: Patient's fiance [name] [phone] regarding patient's [employer] disability forms there's a couple of issues, know what's the status, and having them faxed as soon as possible because the patient's disability will run out soon. Informed [name], patient contact the office today, provider and CMA are will patients at the moment. Please advise and call back.   Spoke with patient to let him know Tammy does have his paperwork at her desk and one these papers have been filled out our office will send them back . Patient stated that [employer] is hold his checks until our office sends the paperwork back

## 2023-09-22 NOTE — Telephone Encounter (Signed)
 I do not see anything in [name] notes that he is out of work. Will have to see if this was the intent.  Order was for pulmonary rehab .

## 2023-09-22 NOTE — Telephone Encounter (Signed)
 Hi, the patient called to follow up on the status of the FMLA paperwork to ensure it has been fax to [employer]. Patient is requesting a call from [place] to verify the paper work has been faxed. [phone]. Paper work is being handled by [employer] in Dr. Lanora Manis absence. Thank-you

## 2023-09-22 NOTE — Telephone Encounter (Signed)
 FMLA paperwork placed for Jackson Hatfield to complete/sign.  Jackson Hatfield, please advise.  Thank you.

## 2023-09-22 NOTE — Telephone Encounter (Signed)
 Spoke with patient regarding prior message . Advised patient that Tammy P has the FMLA paperwork at her desk and once its filled out our office will send it back to [employer] . Patient stated the sooner the better patient is waiting for his checks that [employer] has been holding from him .

## 2023-09-23 ENCOUNTER — Telehealth: Payer: Self-pay

## 2023-09-23 NOTE — Telephone Encounter (Signed)
 I have the FMLA paperwork to fill out.  I would like to have his PFTs from [facility]. I would also like the 6 minute walk test results from [facility]. I need these studies prior to completing his FMLA.  I have told him many times in the visits to work on his conditioning and weight loss as I believe this is adding to his dyspnea and difficulty performing his work.  JD

## 2023-09-23 NOTE — Telephone Encounter (Signed)
 Copied from CRM [id number]. Topic: General - Other >> Sep 21, 2023  9:54 AM [name] wrote: Reason for CRM: Patient need to know if office faxed papers over to [employer] (short term disability. >> Sep 22, 2023  4:38 PM [name] wrote: Patient is calling in to inquire about paperwork, as he has bills to pay and is not getting paid until paperwork has been submitted. Informed of update in chart. Agent reached out to [facility] for clarity on what needs to be done to move forward. After extensive hold, informed by [employer] that [name] did not take patient out of work and therefore, they cannot complete paperwork. [facility] advised to send paperwork to PCP or Pulmonary Rehab. Relayed information [employer] provided to patient. Patient states that PCP sent him to [place] for the paperwork and that he has yet to establish with the Pulmonary rehab physician, as they require a copayment he cannot pay because he has not been paid and will not be until paperwork has been submitted.  >> Sep 21, 2023  3:29 PM [name] wrote: Patient's fiance [name] [phone] regarding patient's [employer] disability forms there's a couple of issues, know what's the status, and having them faxed as soon as possible because the patient's disability will run out soon. Informed [name], patient contact the office today, provider and CMA are will patients at the moment. Please advise and call back.   This was handled and sent back to pulmonologist. See telephone encounter 09-21-23

## 2023-09-23 NOTE — Telephone Encounter (Signed)
 Patient was also recently seen in televisit by the [facility] pulmonary team, started on daily azithromycin.   I would check with him why he is not having them fill out his FMLA paperwork.  Thanks, JD

## 2023-09-28 DIAGNOSIS — J479 Bronchiectasis, uncomplicated: Secondary | ICD-10-CM | POA: Diagnosis not present

## 2023-09-28 DIAGNOSIS — G4733 Obstructive sleep apnea (adult) (pediatric): Secondary | ICD-10-CM | POA: Diagnosis not present

## 2023-09-30 NOTE — Telephone Encounter (Signed)
 Pt states he was under the impression that [facility] was going to fill these out. Pt states our office already told him that these papers were ready but his checks have stopped due to wanting for our office. Pt stated there has been a lot of back and forth because now he is being told that everything was received and his checks started back up but he was not aware if our office ever actually sent anything. Nothing further is needed at this time. Pt asks that we hold on to the FMLA papers incase anything is needed. Pt will call our office if he needs [place]. FYI Dr Francine Graven.

## 2023-09-30 NOTE — Telephone Encounter (Signed)
 Thanks for the update.  Will place FMLA papers in my shelf.  JD

## 2023-10-04 ENCOUNTER — Other Ambulatory Visit: Payer: Self-pay | Admitting: Medical

## 2023-10-16 ENCOUNTER — Ambulatory Visit: Payer: Self-pay

## 2023-10-16 NOTE — Telephone Encounter (Signed)
 Copied from CRM [id number]. Topic: Clinical - Red Word Triage >> Oct 16, 2023  1:27 PM [place] J wrote: [facility] that prompted transfer to Nurse Triage: Patient feels like he's getting pneumonia again. He's having shortness of breathe and spiked a fever of 101.2. Patient has been through this before and was hospitalized for a week.   Chief Complaint: Shortness of breath worse exertion,  Symptoms: shortness of breath worse on exertion Frequency: past couple of days Pertinent Negatives: Patient denies nausea, vomiting, diarrhea, chest pain, coughing up blood Disposition: [] ED /[] Urgent Care (no appt availability in office) / [] Appointment(In office/virtual)/ []  [place] [facility]/ [] Home Care/ [x] Refused Recommended Disposition /[] [facility]/ []  Follow-up with PCP Additional Notes: Patient called and advised that he has been having shortness of breath, an uncomfortable feeling in his left lung, and last night he had a fever of 101.2. Patient states that he had pneumonia back in February and this feels very similar. Patient has been using breathing treatments. Patient wants an antibiotic to get ahead of this because he feels like pneumonia is back. Patient is currently in [place] helping a friend so about 3-3.5 hours away from home. Patient speaking in complete sentences and states that he doesn't feel like his heart is beating fast. Patient denies any chest pain, fever at this time (he hasn't checked it but he feels like the fever broke earlier after taking medication), vomiting, diarrhea, coughing up blood, or nausea. Patient is advised that right now the recommendation is that he is seen and evaluated for his symptoms in the next couple of hours, especially with his medical history. Patient wanted to see if he could get a prescription from his PCP of antibiotics and get those when he comes back home in a few hours. Patient is also advised that Urgent Cares are available for him to  get checked out. Patient is also advised that if he gets any worse to go to an Emergency Room. Patient verbalized understanding.  Reason for Disposition  [1] MILD difficulty breathing (e.g., minimal/no SOB at rest, SOB with walking, pulse <100) AND [2] NEW-onset or WORSE than normal  Answer Assessment - Initial Assessment Questions 1. RESPIRATORY STATUS: "Describe your breathing?" (e.g., wheezing, shortness of breath, unable to speak, severe coughing)      Shortness of breath, dry cough 2. ONSET: "When did this breathing problem begin?"      Couple of days ago 3. PATTERN "Does the difficult breathing come and go, or has it been constant since it started?"      Constantly short of breath and it is worse at night and with exertion 4. SEVERITY: "How bad is your breathing?" (e.g., mild, moderate, severe)    - MILD: No SOB at rest, mild SOB with walking, speaks normally in sentences, can lie down, no retractions, pulse < 100.    - MODERATE: SOB at rest, SOB with minimal exertion and prefers to sit, cannot lie down flat, speaks in phrases, mild retractions, audible wheezing, pulse 100-120.    - SEVERE: Very SOB at rest, speaks in single words, struggling to breathe, sitting hunched forward, retractions, pulse > 120      Mild-Moderate 5. RECURRENT SYMPTOM: "Have you had difficulty breathing before?" If Yes, ask: "When was the last time?" and "What happened that time?"      In February he had pneumonia 6. CARDIAC HISTORY: "Do you have any history of heart disease?" (e.g., heart attack, angina, bypass surgery, angioplasty)  No 7. LUNG HISTORY: "Do you have any history of lung disease?"  (e.g., pulmonary embolus, asthma, emphysema)     bronchiectasis 8. CAUSE: "What do you think is causing the breathing problem?"      unsure 9. OTHER SYMPTOMS: "Do you have any other symptoms? (e.g., dizziness, runny nose, cough, chest pain, fever)     Fevers, cough 10. O2 SATURATION MONITOR:  "Do you use an  oxygen saturation monitor (pulse oximeter) at home?" If Yes, ask: "What is your reading (oxygen level) today?" "What is your usual oxygen saturation reading?" (e.g., 95%)       ------ 12. TRAVEL: "Have you traveled out of the country in the last month?" (e.g., travel history, exposures)       No  Protocols used: Breathing Difficulty-A-AH

## 2023-10-16 NOTE — Telephone Encounter (Signed)
 This RN called [facility] to advise them of patient's current situation. And his request for an antibiotic. This RN then called the patient back and advised him that his PCP office stated that he has to be seen and assessed before any antibiotics could be prescribed.  Patient didn't answer.  This RN left a voicemail for him to call back.

## 2023-10-17 DIAGNOSIS — R030 Elevated blood-pressure reading, without diagnosis of hypertension: Secondary | ICD-10-CM | POA: Diagnosis not present

## 2023-10-17 DIAGNOSIS — E669 Obesity, unspecified: Secondary | ICD-10-CM | POA: Diagnosis not present

## 2023-10-17 DIAGNOSIS — Z6838 Body mass index (BMI) 38.0-38.9, adult: Secondary | ICD-10-CM | POA: Diagnosis not present

## 2023-10-17 DIAGNOSIS — J189 Pneumonia, unspecified organism: Secondary | ICD-10-CM | POA: Diagnosis not present

## 2023-10-19 NOTE — Telephone Encounter (Signed)
Pt notified via my chart to schedule an appt

## 2023-10-28 DIAGNOSIS — J479 Bronchiectasis, uncomplicated: Secondary | ICD-10-CM | POA: Diagnosis not present

## 2023-10-28 DIAGNOSIS — G4733 Obstructive sleep apnea (adult) (pediatric): Secondary | ICD-10-CM | POA: Diagnosis not present

## 2023-10-30 ENCOUNTER — Other Ambulatory Visit: Payer: Self-pay | Admitting: Medical

## 2023-11-27 ENCOUNTER — Other Ambulatory Visit: Payer: Self-pay | Admitting: Medical

## 2023-11-30 ENCOUNTER — Ambulatory Visit: Payer: Self-pay | Admitting: Medical

## 2024-01-05 ENCOUNTER — Other Ambulatory Visit: Payer: Self-pay | Admitting: Medical

## 2024-01-05 MED ORDER — LOSARTAN POTASSIUM 100 MG PO TABS: 100.0000 mg | ORAL_TABLET | Freq: Every day | ORAL | 0 refills | Status: DC

## 2024-01-05 NOTE — Telephone Encounter (Signed)
 Copied from CRM [id number]. Topic: Clinical - Medication Refill >> Jan 05, 2024  9:21 AM Montie POUR wrote: Medication: losartan  (COZAAR ) 100 MG tablet  Has the patient contacted their pharmacy? Yes (Agent: If no, request that the patient contact the pharmacy for the refill. If patient does not wish to contact the pharmacy document the reason why and proceed with request.) (Agent: If yes, when and what did the pharmacy advise?) Pharmacy needs order to refill  This is the patient's preferred pharmacy:  [id number] [place], [place] - [address] [place] [address] Phone: [phone] Fax: [phone]   Is this the correct pharmacy for this prescription? Yes If no, delete pharmacy and type the correct one.   Has the prescription been filled recently? No  Is the patient out of the medication? Yes  Has the patient been seen for an appointment in the last year OR does the patient have an upcoming appointment? Yes  Can we respond through MyChart? Yes  Agent: Please be advised that Rx refills may take up to 3 business days. We ask that you follow-up with your pharmacy.

## 2024-01-27 ENCOUNTER — Other Ambulatory Visit: Payer: Self-pay | Admitting: Medical

## 2024-02-22 ENCOUNTER — Emergency Department (HOSPITAL_COMMUNITY): Payer: Self-pay

## 2024-02-22 ENCOUNTER — Encounter (HOSPITAL_COMMUNITY): Payer: Self-pay | Admitting: Emergency Medicine

## 2024-02-22 ENCOUNTER — Emergency Department (HOSPITAL_COMMUNITY)
Admission: EM | Admit: 2024-02-22 | Discharge: 2024-02-22 | Disposition: A | Discharge status: Home / Self Care | Payer: Self-pay | Attending: Emergency Medicine | Admitting: Emergency Medicine

## 2024-02-22 ENCOUNTER — Other Ambulatory Visit: Payer: Self-pay

## 2024-02-22 DIAGNOSIS — Z7984 Long term (current) use of oral hypoglycemic drugs: Secondary | ICD-10-CM | POA: Insufficient documentation

## 2024-02-22 DIAGNOSIS — M5432 Sciatica, left side: Secondary | ICD-10-CM

## 2024-02-22 DIAGNOSIS — M5442 Lumbago with sciatica, left side: Secondary | ICD-10-CM | POA: Insufficient documentation

## 2024-02-22 DIAGNOSIS — M545 Low back pain, unspecified: Secondary | ICD-10-CM | POA: Diagnosis present

## 2024-02-22 DIAGNOSIS — E119 Type 2 diabetes mellitus without complications: Secondary | ICD-10-CM | POA: Diagnosis not present

## 2024-02-22 LAB — URINALYSIS, ROUTINE W REFLEX MICROSCOPIC
Bilirubin Urine: NEGATIVE
Glucose, UA: NEGATIVE mg/dL
Hgb urine dipstick: NEGATIVE
Ketones, ur: NEGATIVE mg/dL
Leukocytes,Ua: NEGATIVE
Nitrite: NEGATIVE
Protein, ur: NEGATIVE mg/dL
Specific Gravity, Urine: 1.008 (ref 1.005–1.030)
pH: 6 (ref 5.0–8.0)

## 2024-02-22 MED ORDER — CYCLOBENZAPRINE HCL 10 MG PO TABS: 10.0000 mg | ORAL_TABLET | Freq: Three times a day (TID) | ORAL | 0 refills | Status: AC | PRN

## 2024-02-22 MED ORDER — OXYCODONE-ACETAMINOPHEN 5-325 MG PO TABS
1.0000 | ORAL_TABLET | Freq: Once | ORAL | Status: AC
  Administered 2024-02-22: 1 via ORAL
  Filled 2024-02-22: qty 1

## 2024-02-22 MED ORDER — PREDNISONE 50 MG PO TABS
60.0000 mg | ORAL_TABLET | Freq: Once | ORAL | Status: AC
  Administered 2024-02-22: 60 mg via ORAL
  Filled 2024-02-22: qty 1

## 2024-02-22 MED ORDER — IBUPROFEN 800 MG PO TABS
800.0000 mg | ORAL_TABLET | Freq: Once | ORAL | Status: AC
  Administered 2024-02-22: 800 mg via ORAL
  Filled 2024-02-22: qty 1

## 2024-02-22 MED ORDER — OXYCODONE-ACETAMINOPHEN 5-325 MG PO TABS: 1.0000 | ORAL_TABLET | Freq: Four times a day (QID) | ORAL | 0 refills | Status: AC | PRN

## 2024-02-22 MED ORDER — PREDNISONE 10 MG PO TABS
40.0000 mg | ORAL_TABLET | Freq: Every day | ORAL | 0 refills | Status: AC
Start: 2024-02-22 — End: ?

## 2024-02-22 MED ORDER — CYCLOBENZAPRINE HCL 10 MG PO TABS
10.0000 mg | ORAL_TABLET | Freq: Once | ORAL | Status: AC
  Administered 2024-02-22: 10 mg via ORAL
  Filled 2024-02-22: qty 1

## 2024-02-22 NOTE — Discharge Instructions (Addendum)
 Please schedule follow-up with your primary care doctor as you may need further imaging and physical therapy.

## 2024-02-22 NOTE — ED Notes (Signed)
 Patient transported to X-ray

## 2024-02-22 NOTE — ED Provider Notes (Signed)
 [place] EMERGENCY DEPARTMENT AT [facility] Provider Note   CSN: [id number] Arrival date & time: 02/22/24  1601     Patient presents with: Back Pain   [place] is a 53 y.o. male.  He has a history of sarcoidosis and diabetes.  Complaining of pain across his whole lower back and pain radiating down his left buttocks below his knee down has been going on for a week.  Works as a [employer].  No specific trauma.  Taking ibuprofen  with minimal improvement.  Pain is keeping him up at night.  Has had steroids before for prior medical issues.  No abdominal pain no urinary symptoms.  No numbness or weakness no fevers or chills.  Has a primary care doctor.  {Add pertinent medical, surgical, social history, OB history to YEP:67052} The history is provided by the patient.  Back Pain Location:  Lumbar spine and gluteal region Quality:  Aching Radiates to:  L posterior upper leg Pain severity:  Severe Pain is:  Same all the time Onset quality:  Gradual Duration:  1 week Timing:  Constant Progression:  Unchanged Chronicity:  New Relieved by:  Nothing Worsened by:  Bending and sitting Ineffective treatments:  NSAIDs Associated symptoms: no abdominal pain, no bladder incontinence, no bowel incontinence, no dysuria, no fever, no numbness and no weight loss        Prior to Admission medications   Medication Sig Start Date End Date Taking? Authorizing Provider  albuterol  (PROVENTIL ) (2.5 MG/3ML) 0.083% nebulizer solution Take 3 mLs (2.5 mg total) by nebulization 3 (three) times daily as needed for wheezing or shortness of breath. 07/27/23   [name], [place], PA-C  albuterol  (VENTOLIN  HFA) 108 (90 Base) MCG/ACT inhaler Inhale 2 puffs into the lungs every 4 (four) hours as needed for wheezing or shortness of breath. 06/11/23   [name], [place], PA-C  amLODipine  (NORVASC ) 5 MG tablet TAKE 1 TABLET (5 MG TOTAL) BY MOUTH DAILY. 09/21/23   [name], [place], PA-C  atorvastatin  (LIPITOR) 10  MG tablet TAKE 1 TABLET BY MOUTH EVERY DAY 09/21/23   [name], [place], PA-C  losartan  (COZAAR ) 100 MG tablet TAKE 1 TABLET BY MOUTH EVERY DAY 01/27/24   [name], [place], PA-C  metFORMIN  (GLUCOPHAGE ) 500 MG tablet TAKE 1 TABLET BY MOUTH 2 TIMES DAILY WITH A MEAL. 10/05/23   [name], [place], PA-C  Multiple Vitamins-Minerals (MENS ONE DAILY PO) Take 1 tablet by mouth daily.     [provider]  sodium chloride  HYPERTONIC 3 % nebulizer solution Take by nebulization 3 (three) times daily. Use flutter valve after each nebulizer use 09/07/23   Kara Dorn NOVAK, MD  tadalafil  (CIALIS ) 5 MG tablet Take 1 tablet (5 mg total) by mouth daily as needed for erectile dysfunction. 05/12/23   Matilda Senior, MD  tamsulosin  (FLOMAX ) 0.4 MG CAPS capsule Take 1 capsule (0.4 mg total) by mouth in the morning and at bedtime. 12/29/22   Larocco, Sarah C, FNP  Tiotropium Bromide-Olodaterol (STIOLTO RESPIMAT ) 2.5-2.5 MCG/ACT AERS Inhale 2 puffs into the lungs daily. 09/07/23   Kara Dorn NOVAK, MD  traZODone  (DESYREL ) 50 MG tablet Take 1 tablet (50 mg total) by mouth at bedtime as needed for sleep. 07/19/23   Lue Elsie BROCKS, MD    Allergies: Naproxen and Penicillin g    Review of Systems  Constitutional:  Negative for fever and weight loss.  Gastrointestinal:  Negative for abdominal pain and bowel incontinence.  Genitourinary:  Negative for bladder incontinence and dysuria.  Musculoskeletal:  Positive for back pain.  Neurological:  Negative for numbness.    Updated Vital Signs BP (!) 152/88 (BP Location: Left Arm)   Pulse 64   Temp 98.3 F (36.8 C) (Oral)   Resp 18   Ht 6' 2 (1.88 m)   Wt 133.8 kg   SpO2 97%   BMI 37.88 kg/m   Physical Exam Vitals and nursing note reviewed.  Constitutional:      Appearance: Normal appearance. He is well-developed.  HENT:     Head: Normocephalic and atraumatic.  Eyes:     Conjunctiva/sclera: Conjunctivae normal.  Cardiovascular:     Rate and Rhythm:  Normal rate and regular rhythm.     Heart sounds: No murmur heard. Pulmonary:     Effort: Pulmonary effort is normal. No respiratory distress.     Breath sounds: Normal breath sounds.  Abdominal:     Palpations: Abdomen is soft.     Tenderness: There is no abdominal tenderness. There is no guarding or rebound.  Musculoskeletal:        General: No deformity.     Cervical back: Neck supple.  Skin:    General: Skin is warm and dry.  Neurological:     General: No focal deficit present.     Mental Status: He is alert.     GCS: GCS eye subscore is 4. GCS verbal subscore is 5. GCS motor subscore is 6.     Motor: No weakness.     Gait: Gait normal.     (all labs ordered are listed, but only abnormal results are displayed) Labs Reviewed - No data to display  EKG: None  Radiology: No results found.  {Document cardiac monitor, telemetry assessment procedure when appropriate:32947} Procedures   Medications Ordered in the ED  cyclobenzaprine  (FLEXERIL ) tablet 10 mg (has no administration in time range)  ibuprofen  (ADVIL ) tablet 800 mg (has no administration in time range)  oxyCODONE -acetaminophen  (PERCOCET/ROXICET) 5-325 MG per tablet 1 tablet (has no administration in time range)      {Click here for ABCD2, HEART and other calculators REFRESH Note before signing:1}                              Medical Decision Making Amount and/or Complexity of Data Reviewed Labs: ordered. Radiology: ordered.  Risk Prescription drug management.   This patient complains of ***; this involves an extensive number of treatment Options and is a complaint that carries with it a high risk of complications and morbidity. The differential includes ***  I ordered, reviewed and interpreted labs, which included *** I ordered medication *** and reviewed PMP when indicated. I ordered imaging studies which included *** and I independently    visualized and interpreted imaging which showed  *** Additional history obtained from *** Previous records obtained and reviewed *** I consulted *** and discussed lab and imaging findings and discussed disposition.  Cardiac monitoring reviewed, *** Social determinants considered, *** Critical Interventions: ***  After the interventions stated above, I reevaluated the patient and found *** Admission and further testing considered, ***   {Document critical care time when appropriate  Document review of labs and clinical decision tools ie CHADS2VASC2, etc  Document your independent review of radiology images and any outside records  Document your discussion with family members, caretakers and with consultants  Document social determinants of health affecting pt's care  Document your decision making why or why not admission, treatments were  needed:32947:::1}   Final diagnoses:  None    ED Discharge Orders     None

## 2024-02-22 NOTE — ED Triage Notes (Signed)
 Pt to the ED with complaints of back pain that began a few weeks ago. 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

## 2024-02-25 ENCOUNTER — Other Ambulatory Visit: Payer: Self-pay | Admitting: Medical

## 2024-02-26 ENCOUNTER — Other Ambulatory Visit: Payer: Self-pay | Admitting: Medical

## 2024-03-01 ENCOUNTER — Other Ambulatory Visit: Payer: Self-pay

## 2024-03-01 ENCOUNTER — Telehealth: Payer: Self-pay

## 2024-03-01 DIAGNOSIS — N398 Other specified disorders of urinary system: Secondary | ICD-10-CM

## 2024-03-01 DIAGNOSIS — N401 Enlarged prostate with lower urinary tract symptoms: Secondary | ICD-10-CM

## 2024-03-01 MED ORDER — TAMSULOSIN HCL 0.4 MG PO CAPS: 0.4000 mg | ORAL_CAPSULE | Freq: Two times a day (BID) | ORAL | 11 refills | Status: AC

## 2024-03-01 NOTE — Telephone Encounter (Signed)
-----   Message from Nurse [facility] C sent at 03/01/2024  8:25 AM EDT ----- Possible duplicate. Accidentally sent this to [place].

## 2024-03-01 NOTE — Telephone Encounter (Signed)
 Called pt and lvm to c/b in regards to refill on tamsulosin  and to schedule a f/u office number provided

## 2024-03-02 ENCOUNTER — Encounter: Payer: Self-pay | Admitting: Medical

## 2024-05-28 ENCOUNTER — Other Ambulatory Visit: Payer: Self-pay | Admitting: Medical

## 2024-06-22 ENCOUNTER — Other Ambulatory Visit: Payer: Self-pay | Admitting: Medical

## 2024-06-26 ENCOUNTER — Other Ambulatory Visit: Payer: Self-pay | Admitting: Medical
